# Patient Record
Sex: Male | Born: 1945 | Race: White | Hispanic: No | Marital: Married | Smoking: Former smoker
Health system: Southern US, Community
[De-identification: ages and names within clinical notes are randomized; demographics above are authoritative.]

## PROBLEM LIST (undated history)

## (undated) DIAGNOSIS — C801 Malignant (primary) neoplasm, unspecified: Secondary | ICD-10-CM

## (undated) DIAGNOSIS — I10 Essential (primary) hypertension: Secondary | ICD-10-CM

## (undated) DIAGNOSIS — J069 Acute upper respiratory infection, unspecified: Secondary | ICD-10-CM

## (undated) DIAGNOSIS — I4819 Other persistent atrial fibrillation: Secondary | ICD-10-CM

## (undated) DIAGNOSIS — Z7901 Long term (current) use of anticoagulants: Secondary | ICD-10-CM

## (undated) DIAGNOSIS — R609 Edema, unspecified: Secondary | ICD-10-CM

## (undated) DIAGNOSIS — G4733 Obstructive sleep apnea (adult) (pediatric): Secondary | ICD-10-CM

## (undated) DIAGNOSIS — C61 Malignant neoplasm of prostate: Secondary | ICD-10-CM

## (undated) HISTORY — DX: Obstructive sleep apnea (adult) (pediatric): G47.33

## (undated) HISTORY — PX: PROSTATECTOMY: SHX69

## (undated) HISTORY — DX: Other persistent atrial fibrillation: I48.19

---

## 1987-07-14 HISTORY — PX: LUMBAR LAMINECTOMY: SHX95

## 2013-10-03 ENCOUNTER — Ambulatory Visit (INDEPENDENT_AMBULATORY_CARE_PROVIDER_SITE_OTHER): Payer: Medicare Other | Admitting: Diagnostic Neuroimaging

## 2013-10-03 ENCOUNTER — Encounter (INDEPENDENT_AMBULATORY_CARE_PROVIDER_SITE_OTHER): Payer: Self-pay

## 2013-10-03 ENCOUNTER — Encounter: Payer: Self-pay | Admitting: Diagnostic Neuroimaging

## 2013-10-03 VITALS — BP 156/90 | HR 66 | Ht 70.0 in | Wt 227.0 lb

## 2013-10-03 DIAGNOSIS — IMO0002 Reserved for concepts with insufficient information to code with codable children: Secondary | ICD-10-CM

## 2013-10-03 DIAGNOSIS — M5416 Radiculopathy, lumbar region: Secondary | ICD-10-CM

## 2013-10-03 MED ORDER — CYCLOBENZAPRINE HCL 10 MG PO TABS: 10.0000 mg | ORAL_TABLET | Freq: Three times a day (TID) | ORAL | Status: DC | PRN

## 2013-10-03 NOTE — Patient Instructions (Signed)
Cyclobenzaprine tablets  What is this medicine? CYCLOBENZAPRINE (sye kloe BEN za preen) is a muscle relaxer. It is used to treat muscle pain, spasms, and stiffness. This medicine may be used for other purposes; ask your health care provider or pharmacist if you have questions. COMMON BRAND NAME(S): Fexmid, Flexeril  How should I use this medicine? Take this medicine by mouth with a glass of water. Follow the directions on the prescription label. If this medicine upsets your stomach, take it with food or milk. Take your medicine at regular intervals. Do not take it more often than directed. Talk to your pediatrician regarding the use of this medicine in children. Special care may be needed. Overdosage: If you think you have taken too much of this medicine contact a poison control center or emergency room at once. NOTE: This medicine is only for you. Do not share this medicine with others.  What if I miss a dose? If you miss a dose, take it as soon as you can. If it is almost time for your next dose, take only that dose. Do not take double or extra doses.  What may interact with this medicine? -alcohol  What should I watch for while using this medicine? Check with your doctor or health care professional if your condition does not improve within 1 to 3 weeks. You may get drowsy or dizzy when you first start taking the medicine or change doses. Do not drive, use machinery, or do anything that may be dangerous until you know how the medicine affects you. Stand or sit up slowly. Your mouth may get dry. Drinking water, chewing sugarless gum, or sucking on hard candy may help.  What side effects may I notice from receiving this medicine? Side effects that you should report to your doctor or health care professional as soon as possible: -allergic reactions like skin rash, itching or hives, swelling of the face, lips, or tongue -chest pain -fast  heartbeat -hallucinations -seizures -vomiting Side effects that usually do not require medical attention (report to your doctor or health care professional if they continue or are bothersome): -headache This list may not describe all possible side effects. Call your doctor for medical advice about side effects. You may report side effects to FDA at 1-800-FDA-1088.  Where should I keep my medicine? Keep out of the reach of children. Store at room temperature between 15 and 30 degrees C (59 and 86 degrees F). Keep container tightly closed. Throw away any unused medicine after the expiration date.  NOTE: This sheet is a summary. It may not cover all possible information. If you have questions about this medicine, talk to your doctor, pharmacist, or health care provider.  2014, Elsevier/Gold Standard. (2013-01-24 12:48:19)

## 2013-10-03 NOTE — Progress Notes (Signed)
[facility]  PATIENT: Charles Cameron DOB: 04/30/1946  REFERRING CLINICIAN: Cho HISTORY FROM: patient  REASON FOR VISIT: new consult   HISTORICAL  CHIEF COMPLAINT:  Chief Complaint  Patient presents with  . Pain    L side hip    HISTORY OF PRESENT ILLNESS:   68 year old male with new onset left hip and left leg pain for the past 5 weeks. Patient has history of L5-S1 laminectomy in 1989.  Patient has been using yoga classes for the past 4 months. 5 weeks ago he started a new class with more advanced positions and stretching. The day after the class patient had significant right knee and left hip pain. Over time his right knee pain resolved. His left hip pain this persisted. Patient describes an aching, throbbing, painful sensation radiating from his left hip, down his left leg and into his left calf. Patient has tried initial stretching exercises, ibuprofen without adequate relief.  No problems with his right leg. No cause of his upper extremities, face, vision.  REVIEW OF SYSTEMS: Full 14 system review of systems performed and notable only for only as per history of present illness. Otherwise negative.  ALLERGIES: No Known Allergies  HOME MEDICATIONS: No outpatient prescriptions prior to visit.   No facility-administered medications prior to visit.    PAST MEDICAL HISTORY: History reviewed. No pertinent past medical history.  PAST SURGICAL HISTORY: Past Surgical History  Procedure Laterality Date  . Lumbar laminectomy  1989    FAMILY HISTORY: Family History  Problem Relation Age of Onset  . Heart attack Mother   . Prostate cancer Father   . Colon cancer    . Hypertension Father   . Hypertension Mother   . Diabetes Paternal Grandfather     SOCIAL HISTORY:  History   Social History  . Marital Status: Married    Spouse Name: [name]    Number of Children: 5  . Years of Education: 16   Occupational History  . retired    Social  History Main Topics  . Smoking status: Former Smoker -- 2.00 packs/day for 27 years    Types: Cigarettes    Quit date: 07/14/1983  . Smokeless tobacco: Never Used  . Alcohol Use: Yes     Comment: light use; rarely  . Drug Use: No  . Sexual Activity: Not on file   Other Topics Concern  . Not on file   Social History Narrative   Patient lives at home with spouse.   Caffeine Use: 2 cups daily     PHYSICAL EXAM  Filed Vitals:   10/03/13 0838  BP: 156/90  Pulse: 66  Height: 5\' 10"  (1.778 m)  Weight: 227 lb (102.967 kg)    Not recorded    Body mass index is 32.57 kg/(m^2).  GENERAL EXAM: Patient is in no distress; well developed, nourished and groomed; neck is supple; POSITIVE CROSSED AND STRAIGHT LEG RAISE (TRIGGERS PAIN IN LEFT HIP).  CARDIOVASCULAR: Regular rate and rhythm, no murmurs, no carotid bruits  NEUROLOGIC: MENTAL STATUS: awake, alert, oriented to person, place and time, recent and remote memory intact, normal attention and concentration, language fluent, comprehension intact, naming intact, fund of knowledge appropriate CRANIAL NERVE: no papilledema on fundoscopic exam, pupils equal and reactive to light, visual fields full to confrontation, extraocular muscles intact, no nystagmus, facial sensation and strength symmetric, hearing intact, palate elevates symmetrically, uvula midline, shoulder shrug symmetric, tongue midline. MOTOR: normal bulk and tone, full strength in the  BUE, BLE SENSORY: normal and symmetric to light touch, pinprick, temperature, vibration; EXCEPT DECR IN LEFT LLE IN THE LATERAL ASPECT BELOW THE KNEE COORDINATION: finger-nose-finger, fine finger movements normal REFLEXES: BUE 2, KNEES 2, RIGHT ANKLE 1, LEFT ANKLE TRACE GAIT/STATION: narrow based gait; able to walk on toes, heels and tandem; romberg is negative    DIAGNOSTIC DATA (LABS, IMAGING, TESTING) - I reviewed patient records, labs, notes, testing and imaging myself where  available.  No results found for this basename: WBC, HGB, HCT, MCV, PLT   No results found for this basename: na, k, cl, co2, glucose, bun, creatinine, calcium, prot, albumin, ast, alt, alkphos, bilitot, gfrnonaa, gfraa   No results found for this basename: CHOL, HDL, LDLCALC, LDLDIRECT, TRIG, CHOLHDL   No results found for this basename: HGBA1C   No results found for this basename: VITAMINB12   No results found for this basename: TSH    I reviewed images myself and agree with interpretation. -VRP  Outside Lumbar xrays (4 views) - mild degenerative changes   ASSESSMENT AND PLAN  68 y.o. year old male here with new onset left hip, left leg pain following change in physical activity. Neurologic examination notable for positive straight and crossed leg raise testing, with decreased sensation of the left leg. His numbness in his left leg may be related to prior left L5 radiculopathy, according to patient's count. However the new pain may be related to a new L5 or S1 radiculopathy.  Ddx: left lumbar radiculopathy (L5, S1) vs musculoskeletal strain vs myofascial pain  PLAN: - cyclobenzaprine prn - PT eval - MRI lumbar spine   Orders Placed This Encounter  Procedures  . MR Lumbar Spine Wo Contrast  . Ambulatory referral to Physical Therapy   Meds ordered this encounter  Medications  . ibuprofen (ADVIL,MOTRIN) 600 MG tablet    Sig: Take 600 mg by mouth 3 (three) times daily.  . cyclobenzaprine (FLEXERIL) 10 MG tablet    Sig: Take 1 tablet (10 mg total) by mouth 3 (three) times daily as needed for muscle spasms.    Dispense:  90 tablet    Refill:  0   Return in about 3 months (around 01/03/2014).    Penni Bombard, MD 1/61/0960, 4:54 AM Certified in Neurology, Neurophysiology and Neuroimaging  [facility] [address] [place], [place] [address] [phone]

## 2013-10-08 ENCOUNTER — Ambulatory Visit
Admission: RE | Admit: 2013-10-08 | Discharge: 2013-10-08 | Disposition: A | Discharge status: Home / Self Care | Payer: Medicare Other | Source: Ambulatory Visit | Attending: Diagnostic Neuroimaging | Admitting: Diagnostic Neuroimaging

## 2013-10-08 DIAGNOSIS — M5416 Radiculopathy, lumbar region: Secondary | ICD-10-CM

## 2013-10-09 ENCOUNTER — Ambulatory Visit: Payer: Medicare Other | Attending: Diagnostic Neuroimaging

## 2013-10-09 DIAGNOSIS — R5381 Other malaise: Secondary | ICD-10-CM | POA: Insufficient documentation

## 2013-10-09 DIAGNOSIS — IMO0002 Reserved for concepts with insufficient information to code with codable children: Secondary | ICD-10-CM | POA: Insufficient documentation

## 2013-10-09 DIAGNOSIS — IMO0001 Reserved for inherently not codable concepts without codable children: Secondary | ICD-10-CM | POA: Insufficient documentation

## 2013-10-09 DIAGNOSIS — M25669 Stiffness of unspecified knee, not elsewhere classified: Secondary | ICD-10-CM | POA: Insufficient documentation

## 2013-10-10 ENCOUNTER — Telehealth: Payer: Self-pay | Admitting: Diagnostic Neuroimaging

## 2013-10-10 DIAGNOSIS — IMO0002 Reserved for concepts with insufficient information to code with codable children: Secondary | ICD-10-CM

## 2013-10-10 NOTE — Telephone Encounter (Signed)
Patient calling for results of his recent MRI.  Please call to advise.

## 2013-10-10 NOTE — Telephone Encounter (Signed)
Pt is calling requesting MRI results. I informed the pt that his MRI results is not in yet. Pt verbalized understanding. FYI

## 2013-10-11 ENCOUNTER — Ambulatory Visit: Payer: Medicare Other | Attending: Diagnostic Neuroimaging

## 2013-10-11 DIAGNOSIS — M25669 Stiffness of unspecified knee, not elsewhere classified: Secondary | ICD-10-CM | POA: Insufficient documentation

## 2013-10-11 DIAGNOSIS — IMO0002 Reserved for concepts with insufficient information to code with codable children: Secondary | ICD-10-CM | POA: Insufficient documentation

## 2013-10-11 DIAGNOSIS — IMO0001 Reserved for inherently not codable concepts without codable children: Secondary | ICD-10-CM | POA: Insufficient documentation

## 2013-10-11 DIAGNOSIS — R5381 Other malaise: Secondary | ICD-10-CM | POA: Insufficient documentation

## 2013-10-11 NOTE — Telephone Encounter (Signed)
Pt calling requesting MRI results. Please advise °

## 2013-10-11 NOTE — Telephone Encounter (Signed)
Patient is calling to request his MRI results. Please call and advise patient.

## 2013-10-12 NOTE — Telephone Encounter (Signed)
I called patient and spouse multiple times. No answer. Cannot leave message on patient's phone.   Please call and give MRI results. There is possible pinched nerve (left L5) and other degenerative changes as per report.   Options are to continue physical therapy or we can refer him to spine surgeon for surgical evaluation. If he is reached by phone, I can discuss further. Otherwise, mail letter to patient.  Penni Bombard, MD 09/13/74, 2:26 PM Certified in Neurology, Neurophysiology and Neuroimaging  [facility] [address], [place], [place] [address] [phone]

## 2013-10-12 NOTE — Telephone Encounter (Signed)
Called pt to inform him per Dr. Leta Baptist that there is possible pinched neerve (left L5) and other degenerative changes as per report and that the pt's options are to continue physical therapy or the doctor can refer pt to spine surgeon for surgical evaluation. Pt stated that he would like to finish physical therapy first and then he will let Dr. Leta Baptist know what he would like to do then. I advised the pt that if he has any other problems, questions or concerns to call the office. Pt verbalized understanding. FYI

## 2013-10-16 ENCOUNTER — Ambulatory Visit: Payer: Medicare Other | Admitting: Physical Therapy

## 2013-10-18 ENCOUNTER — Ambulatory Visit: Payer: Medicare Other

## 2013-10-23 ENCOUNTER — Ambulatory Visit: Payer: Medicare Other

## 2013-10-25 ENCOUNTER — Ambulatory Visit: Payer: Medicare Other | Admitting: Physical Therapy

## 2013-10-30 ENCOUNTER — Ambulatory Visit: Payer: Medicare Other | Admitting: Physical Therapy

## 2013-11-01 ENCOUNTER — Ambulatory Visit: Payer: Medicare Other

## 2014-01-03 ENCOUNTER — Ambulatory Visit: Payer: Medicare Other | Admitting: Diagnostic Neuroimaging

## 2014-01-31 ENCOUNTER — Ambulatory Visit: Payer: Medicare Other | Admitting: Diagnostic Neuroimaging

## 2014-03-15 ENCOUNTER — Ambulatory Visit: Payer: Medicare Other | Admitting: Diagnostic Neuroimaging

## 2014-07-13 DIAGNOSIS — C61 Malignant neoplasm of prostate: Secondary | ICD-10-CM

## 2014-07-13 HISTORY — DX: Malignant neoplasm of prostate: C61

## 2018-07-10 ENCOUNTER — Other Ambulatory Visit: Payer: Self-pay

## 2018-07-10 ENCOUNTER — Emergency Department (HOSPITAL_BASED_OUTPATIENT_CLINIC_OR_DEPARTMENT_OTHER): Payer: Medicare Other

## 2018-07-10 ENCOUNTER — Encounter (HOSPITAL_BASED_OUTPATIENT_CLINIC_OR_DEPARTMENT_OTHER): Payer: Self-pay

## 2018-07-10 ENCOUNTER — Inpatient Hospital Stay (HOSPITAL_BASED_OUTPATIENT_CLINIC_OR_DEPARTMENT_OTHER)
Admission: EM | Admit: 2018-07-10 | Discharge: 2018-07-14 | DRG: 308 | Disposition: A | Discharge status: Home / Self Care | Payer: Medicare Other | Attending: Cardiology | Admitting: Cardiology

## 2018-07-10 DIAGNOSIS — Z8042 Family history of malignant neoplasm of prostate: Secondary | ICD-10-CM | POA: Diagnosis not present

## 2018-07-10 DIAGNOSIS — R0602 Shortness of breath: Secondary | ICD-10-CM

## 2018-07-10 DIAGNOSIS — Z7901 Long term (current) use of anticoagulants: Secondary | ICD-10-CM

## 2018-07-10 DIAGNOSIS — J189 Pneumonia, unspecified organism: Secondary | ICD-10-CM | POA: Diagnosis present

## 2018-07-10 DIAGNOSIS — Z8546 Personal history of malignant neoplasm of prostate: Secondary | ICD-10-CM

## 2018-07-10 DIAGNOSIS — I480 Paroxysmal atrial fibrillation: Secondary | ICD-10-CM | POA: Diagnosis present

## 2018-07-10 DIAGNOSIS — Z8249 Family history of ischemic heart disease and other diseases of the circulatory system: Secondary | ICD-10-CM

## 2018-07-10 DIAGNOSIS — J440 Chronic obstructive pulmonary disease with acute lower respiratory infection: Secondary | ICD-10-CM | POA: Diagnosis present

## 2018-07-10 DIAGNOSIS — I4892 Unspecified atrial flutter: Secondary | ICD-10-CM | POA: Diagnosis not present

## 2018-07-10 DIAGNOSIS — I441 Atrioventricular block, second degree: Secondary | ICD-10-CM | POA: Diagnosis present

## 2018-07-10 DIAGNOSIS — I483 Typical atrial flutter: Secondary | ICD-10-CM | POA: Diagnosis present

## 2018-07-10 DIAGNOSIS — Z87891 Personal history of nicotine dependence: Secondary | ICD-10-CM

## 2018-07-10 DIAGNOSIS — Z800 Family history of malignant neoplasm of digestive organs: Secondary | ICD-10-CM

## 2018-07-10 DIAGNOSIS — Z79899 Other long term (current) drug therapy: Secondary | ICD-10-CM

## 2018-07-10 DIAGNOSIS — J069 Acute upper respiratory infection, unspecified: Secondary | ICD-10-CM | POA: Diagnosis present

## 2018-07-10 DIAGNOSIS — I248 Other forms of acute ischemic heart disease: Secondary | ICD-10-CM | POA: Diagnosis present

## 2018-07-10 DIAGNOSIS — I351 Nonrheumatic aortic (valve) insufficiency: Secondary | ICD-10-CM | POA: Diagnosis not present

## 2018-07-10 DIAGNOSIS — R609 Edema, unspecified: Secondary | ICD-10-CM | POA: Diagnosis present

## 2018-07-10 DIAGNOSIS — I340 Nonrheumatic mitral (valve) insufficiency: Secondary | ICD-10-CM | POA: Diagnosis present

## 2018-07-10 DIAGNOSIS — K5900 Constipation, unspecified: Secondary | ICD-10-CM | POA: Diagnosis present

## 2018-07-10 DIAGNOSIS — R55 Syncope and collapse: Secondary | ICD-10-CM

## 2018-07-10 HISTORY — DX: Acute upper respiratory infection, unspecified: J06.9

## 2018-07-10 HISTORY — DX: Long term (current) use of anticoagulants: Z79.01

## 2018-07-10 HISTORY — DX: Edema, unspecified: R60.9

## 2018-07-10 LAB — CBC
HCT: 43.5 % (ref 39.0–52.0)
HEMOGLOBIN: 14.1 g/dL (ref 13.0–17.0)
MCH: 31.6 pg (ref 26.0–34.0)
MCHC: 32.4 g/dL (ref 30.0–36.0)
MCV: 97.5 fL (ref 80.0–100.0)
Platelets: 224 10*3/uL (ref 150–400)
RBC: 4.46 MIL/uL (ref 4.22–5.81)
RDW: 13.2 % (ref 11.5–15.5)
WBC: 7.5 10*3/uL (ref 4.0–10.5)
nRBC: 0 % (ref 0.0–0.2)

## 2018-07-10 LAB — BASIC METABOLIC PANEL
Anion gap: 7 (ref 5–15)
BUN: 21 mg/dL (ref 8–23)
CO2: 29 mmol/L (ref 22–32)
Calcium: 8.4 mg/dL — ABNORMAL LOW (ref 8.9–10.3)
Chloride: 103 mmol/L (ref 98–111)
Creatinine, Ser: 1.07 mg/dL (ref 0.61–1.24)
GFR calc Af Amer: >60 mL/min (ref >60)
GFR calc non Af Amer: >60 mL/min (ref >60)
Glucose, Bld: 124 mg/dL — ABNORMAL HIGH (ref 70–99)
POTASSIUM: 4.1 mmol/L (ref 3.5–5.1)
Sodium: 139 mmol/L (ref 135–145)

## 2018-07-10 LAB — HEPATIC FUNCTION PANEL
ALT: 102 U/L — ABNORMAL HIGH (ref 0–44)
AST: 65 U/L — ABNORMAL HIGH (ref 15–41)
Albumin: 3.3 g/dL — ABNORMAL LOW (ref 3.5–5.0)
Alkaline Phosphatase: 153 U/L — ABNORMAL HIGH (ref 38–126)
Bilirubin, Direct: 0.2 mg/dL (ref 0.0–0.2)
Indirect Bilirubin: 0.7 mg/dL (ref 0.3–0.9)
Total Bilirubin: 0.9 mg/dL (ref 0.3–1.2)
Total Protein: 6.1 g/dL — ABNORMAL LOW (ref 6.5–8.1)

## 2018-07-10 LAB — INFLUENZA PANEL BY PCR (TYPE A & B)
Influenza A By PCR: NEGATIVE
Influenza B By PCR: NEGATIVE

## 2018-07-10 LAB — BRAIN NATRIURETIC PEPTIDE: B Natriuretic Peptide: 317.2 pg/mL — ABNORMAL HIGH (ref 0.0–100.0)

## 2018-07-10 LAB — TROPONIN I: Troponin I: 0.03 ng/mL (ref <0.03)

## 2018-07-10 MED ORDER — AZITHROMYCIN 500 MG IV SOLR
INTRAVENOUS | Status: AC
  Filled 2018-07-10: qty 500

## 2018-07-10 MED ORDER — DILTIAZEM HCL 100 MG IV SOLR
5.0000 mg/h | INTRAVENOUS | Status: DC
  Administered 2018-07-10 (×2): 5 mg/h via INTRAVENOUS

## 2018-07-10 MED ORDER — ONDANSETRON HCL 4 MG/2ML IJ SOLN: 4.0000 mg | Freq: Four times a day (QID) | INTRAMUSCULAR | Status: DC | PRN

## 2018-07-10 MED ORDER — SODIUM CHLORIDE 0.9 % IV BOLUS: 500.0000 mL | Freq: Once | INTRAVENOUS | Status: DC

## 2018-07-10 MED ORDER — ONDANSETRON HCL 4 MG/2ML IJ SOLN
4.0000 mg | Freq: Once | INTRAMUSCULAR | Status: AC
  Administered 2018-07-10: 4 mg via INTRAVENOUS

## 2018-07-10 MED ORDER — SODIUM CHLORIDE 0.9 % IV SOLN
500.0000 mg | Freq: Once | INTRAVENOUS | Status: AC
  Administered 2018-07-10: 500 mg via INTRAVENOUS
  Filled 2018-07-10: qty 500

## 2018-07-10 MED ORDER — DILTIAZEM HCL 100 MG IV SOLR
INTRAVENOUS | Status: AC
  Filled 2018-07-10: qty 100

## 2018-07-10 MED ORDER — ACETAMINOPHEN 325 MG PO TABS
650.0000 mg | ORAL_TABLET | ORAL | Status: DC | PRN
  Filled 2018-07-10: qty 2

## 2018-07-10 MED ORDER — ORAL CARE MOUTH RINSE
15.0000 mL | Freq: Two times a day (BID) | OROMUCOSAL | Status: DC
  Administered 2018-07-11 – 2018-07-13 (×6): 15 mL via OROMUCOSAL

## 2018-07-10 MED ORDER — GLUCAGON HCL RDNA (DIAGNOSTIC) 1 MG IJ SOLR
1.0000 mg | Freq: Once | INTRAMUSCULAR | Status: AC
  Administered 2018-07-10: 1 mg via INTRAVENOUS
  Filled 2018-07-10: qty 1

## 2018-07-10 MED ORDER — SODIUM CHLORIDE 0.9 % IV SOLN
1.0000 g | Freq: Once | INTRAVENOUS | Status: AC
  Administered 2018-07-10: 1 g via INTRAVENOUS
  Filled 2018-07-10: qty 10

## 2018-07-10 MED ORDER — METOPROLOL TARTRATE 5 MG/5ML IV SOLN
5.0000 mg | Freq: Once | INTRAVENOUS | Status: AC
  Administered 2018-07-10: 5 mg via INTRAVENOUS
  Filled 2018-07-10: qty 5

## 2018-07-10 MED ORDER — ONDANSETRON HCL 4 MG/2ML IJ SOLN
INTRAMUSCULAR | Status: AC
  Filled 2018-07-10: qty 2

## 2018-07-10 MED ORDER — APIXABAN 5 MG PO TABS
5.0000 mg | ORAL_TABLET | Freq: Two times a day (BID) | ORAL | Status: DC
  Administered 2018-07-10 – 2018-07-14 (×8): 5 mg via ORAL
  Filled 2018-07-10 (×4): qty 1
  Filled 2018-07-10: qty 2
  Filled 2018-07-10 (×3): qty 1

## 2018-07-10 MED ORDER — HEPARIN BOLUS VIA INFUSION: 6000.0000 [IU] | Freq: Once | INTRAVENOUS | Status: DC

## 2018-07-10 MED ORDER — DILTIAZEM LOAD VIA INFUSION
15.0000 mg | Freq: Once | INTRAVENOUS | Status: AC
  Administered 2018-07-10: 15 mg via INTRAVENOUS
  Filled 2018-07-10: qty 15

## 2018-07-10 MED ORDER — HEPARIN (PORCINE) 25000 UT/250ML-% IV SOLN: 1650.0000 [IU]/h | INTRAVENOUS | Status: DC

## 2018-07-10 MED ORDER — SODIUM CHLORIDE 0.9 % IV BOLUS
500.0000 mL | Freq: Once | INTRAVENOUS | Status: AC
  Administered 2018-07-10: 500 mL via INTRAVENOUS

## 2018-07-10 MED ORDER — DILTIAZEM HCL-DEXTROSE 100-5 MG/100ML-% IV SOLN (PREMIX)
5.0000 mg/h | INTRAVENOUS | Status: AC
  Administered 2018-07-10 – 2018-07-11 (×2): 15 mg/h via INTRAVENOUS
  Filled 2018-07-10 (×3): qty 100

## 2018-07-10 NOTE — ED Notes (Signed)
ED Provider at bedside. 

## 2018-07-10 NOTE — ED Notes (Signed)
Pt placed on defib pads

## 2018-07-10 NOTE — Progress Notes (Addendum)
ANTICOAGULATION CONSULT NOTE - Initial Consult  Pharmacy Consult for apixaban Indication:atrial fibrillation  No Known Allergies  Patient Measurements: Height: 5\' 11"  (180.3 cm) Weight: 230 lb (104.3 kg) IBW/kg (Calculated) : 75.3 Heparin Dosing Weight: 97 kg  Vital Signs: BP: 126/101 (12/29 1515) Pulse Rate: 156 (12/29 1515)  Labs: Recent Labs    07/10/18 1421  HGB 14.1  HCT 43.5  PLT 224  CREATININE 1.07  TROPONINI 0.03*    Estimated Creatinine Clearance: 74.4 mL/min (by C-G formula based on SCr of 1.07 mg/dL).  Assessment: CC/HPI: 72 yo m presenting with increased SOB tachy pulm edema; new onset afib  PMH: COPD  Anticoag: none pta   Renal: SCr 1.07  Heme/Onc: H&H 14.1/43.5, Plt 224   Plan:  DC hep Apixaban 5 mg bid  Levester Fresh, PharmD, BCPS, BCCCP Clinical Pharmacist [phone]  Please check AMION for all [place] numbers  07/10/2018 3:40 PM

## 2018-07-10 NOTE — ED Notes (Signed)
Dr. Paticia Stack paged for bed assignment.

## 2018-07-10 NOTE — ED Notes (Signed)
[facility] arrived to transfer pt to [facility]

## 2018-07-10 NOTE — ED Triage Notes (Signed)
Pt states hx of chronic bronchitis with increased SOB today. HR 155 during triage. Denies chest pain. Prior to arrival, pt had "bad coughing fit, felt like his head was going to explode- possible LOC and woke up to limbs shaking. No hx siezures". SpO2 95% on RA.

## 2018-07-10 NOTE — ED Notes (Signed)
Pt used albuterol this AM>

## 2018-07-10 NOTE — ED Notes (Signed)
Patient got on a Bedside commode with the assistance of RN and this EMT. Only Urinated no bowel movements. Once patient got back into bed his heart rate went to 200 then 196 then back to 155-150. RN aware.

## 2018-07-10 NOTE — ED Notes (Signed)
Spoke with Agricultural consultant on [place]. RN will call cardiologist to discuss pt placement.

## 2018-07-10 NOTE — ED Notes (Signed)
Pt voiced not feeling well- unable to breath. Face turned red. Pt became diaphoretic. Blood pressure recycled.

## 2018-07-10 NOTE — ED Notes (Signed)
2nd set of cultures obtained from right hand via straight stick

## 2018-07-10 NOTE — ED Notes (Signed)
Pt placed on 2L O2 nasal cannula

## 2018-07-10 NOTE — ED Notes (Signed)
Pt in aflutter

## 2018-07-10 NOTE — ED Notes (Signed)
Pt rhythm now in Afib. HR 115. Repeat EKG done.

## 2018-07-10 NOTE — ED Provider Notes (Signed)
Emergency Department Provider Note   I have reviewed the triage vital signs and the nursing notes.   HISTORY  Chief Complaint No chief complaint on file.   HPI Charles Cameron is a 72 y.o. male with PMH of COPD presents to the emergency department for evaluation of congestion and shortness of breath for the past 3 weeks.  Patient states that he has chronic bronchitis and has been using his albuterol nebulizer with no relief in symptoms.  He does feel like symptoms have worsened over the past several days.  He is not experiencing any chest pain or heart palpitations.  No prior history of arrhythmia.  He presented today because of an episode of severe coughing which led to a brief syncopal event.  This was witnessed by the family at bedside who state that it lasted for only a few seconds and then he returned to his normal self. No radiation of symptoms or modifying factors.   History reviewed. No pertinent past medical history.  Patient Active Problem List   Diagnosis Date Noted  . Atrial flutter ([place]) 07/10/2018    Past Surgical History:  Procedure Laterality Date  . LUMBAR LAMINECTOMY  1989   Allergies Patient has no known allergies.  Family History  Problem Relation Age of Onset  . Heart attack Mother   . Hypertension Mother   . Prostate cancer Father   . Hypertension Father   . Colon cancer Other   . Diabetes Paternal Grandfather     Social History Social History   Tobacco Use  . Smoking status: Former Smoker    Packs/day: 2.00    Years: 27.00    Pack years: 54.00    Types: Cigarettes    Last attempt to quit: 07/14/1983    Years since quitting: 35.0  . Smokeless tobacco: Never Used  Substance Use Topics  . Alcohol use: Yes    Comment: light use; rarely  . Drug use: No    Review of Systems  Constitutional: No fever/chills Eyes: No visual changes. ENT: No sore throat. Cardiovascular: Denies chest pain. Respiratory: Positive shortness of breath and  cough.  Gastrointestinal: No abdominal pain.  No nausea, no vomiting.  No diarrhea.  No constipation. Genitourinary: Negative for dysuria. Musculoskeletal: Negative for back pain. Skin: Negative for rash. Neurological: Negative for headaches, focal weakness or numbness.  10-point ROS otherwise negative.  ____________________________________________   PHYSICAL EXAM:  VITAL SIGNS: ED Triage Vitals [07/10/18 1418]  Enc Vitals Group     BP (!) 151/115     Pulse Rate (!) 155     Resp (!) 21     SpO2 95 %     Pain Score 0   Constitutional: Alert and oriented. Well appearing and in no acute distress. Eyes: Conjunctivae are normal.  Head: Atraumatic. Nose: No congestion/rhinnorhea. Mouth/Throat: Mucous membranes are moist. Neck: No stridor.   Cardiovascular: Rapid a-flutter. Good peripheral circulation. Grossly normal heart sounds.   Respiratory: Increased respiratory effort.  No retractions. Lungs CTAB. Gastrointestinal: Soft and nontender. No distention.  Musculoskeletal: No lower extremity tenderness nor edema. No gross deformities of extremities. Neurologic:  Normal speech and language. No gross focal neurologic deficits are appreciated.  Skin:  Skin is warm, dry and intact. No rash noted.   ____________________________________________   LABS (all labs ordered are listed, but only abnormal results are displayed)  Labs Reviewed  BASIC METABOLIC PANEL - Abnormal; Notable for the following components:      Result Value  Glucose, Bld 124 (*)    Calcium 8.4 (*)    All other components within normal limits  TROPONIN I - Abnormal; Notable for the following components:   Troponin I 0.03 (*)    All other components within normal limits  HEPATIC FUNCTION PANEL - Abnormal; Notable for the following components:   Total Protein 6.1 (*)    Albumin 3.3 (*)    AST 65 (*)    ALT 102 (*)    Alkaline Phosphatase 153 (*)    All other components within normal limits  BRAIN  NATRIURETIC PEPTIDE - Abnormal; Notable for the following components:   B Natriuretic Peptide 317.2 (*)    All other components within normal limits  CULTURE, BLOOD (ROUTINE X 2)  CULTURE, BLOOD (ROUTINE X 2)  CBC  INFLUENZA PANEL BY PCR (TYPE A & B)  HEPARIN LEVEL (UNFRACTIONATED)   ____________________________________________  EKG   EKG Interpretation  Date/Time:  Sunday July 10 2018 14:18:22 EST Ventricular Rate:  156 PR Interval:    QRS Duration: 128 QT Interval:  349 QTC Calculation: 563 R Axis:   60 Text Interpretation:  Atrial flutter  Right bundle branch block No STEMI.  Confirmed by Nanda Quinton [phone]) on 07/10/2018 3:10:41 PM       ____________________________________________  RADIOLOGY  Dg Chest Port 1 View  Result Date: 07/10/2018 CLINICAL DATA:  Shortness of breath. EXAM: PORTABLE CHEST 1 VIEW COMPARISON:  04/26/2017 FINDINGS: Significant interstitial thickening in both lungs, particularly in the lower lungs. Focus of interstitial thickening in the right suprahilar region as well. This is a marked change from the prior examination. Heart size is upper limits of normal. The trachea is midline. Bone structures are unremarkable. Negative for a pneumothorax. IMPRESSION: Significant interstitial thickening throughout both lungs, particularly at the lung bases. Differential diagnosis includes interstitial pulmonary edema versus atypical infection. Electronically Signed   By: Markus Daft M.D.   On: 07/10/2018 14:49    ____________________________________________   PROCEDURES  Procedure(s) performed:   .Critical Care Performed by: Margette Fast, MD Authorized by: Margette Fast, MD   Critical care provider statement:    Critical care time (minutes):  45   Critical care time was exclusive of:  Separately billable procedures and treating other patients and teaching time   Critical care was necessary to treat or prevent imminent or life-threatening  deterioration of the following conditions:  Cardiac failure, circulatory failure and respiratory failure   Critical care was time spent personally by me on the following activities:  Discussions with consultants, evaluation of patient's response to treatment, examination of patient, ordering and performing treatments and interventions, ordering and review of laboratory studies, ordering and review of radiographic studies, pulse oximetry, re-evaluation of patient's condition, obtaining history from patient or surrogate, review of old charts, blood draw for specimens and development of treatment plan with patient or surrogate   I assumed direction of critical care for this patient from another provider in my specialty: no     ____________________________________________   INITIAL IMPRESSION / ASSESSMENT AND PLAN / ED COURSE  Pertinent labs & imaging results that were available during my care of the patient were reviewed by me and considered in my medical decision making (see chart for details).  Patient presents to the ED with cough and congestion symptoms. Had a syncope event today with coughing and found to be in A-flutter with rate in the 150s-160s. Narrow QRS. Differential is broad but patient with bronchitis type symptoms x 3  weeks so atypical PNA is elevated on the differential after CXR reviewed. No PNX or overt pulmonary edema on exam. Starting abx including azithromycin. Troponin with slight elevation, likely rate related. Doubt primary ischemic event.   HR unchanged with Diltiazem bolus and infusion. Spoke with Dr. Sallyanne Kuster with Cardiology. Advises Eliquis and IV metoprolol. No hypotension or instability clinically so no cardioversion at this time. Updated patient on the plan. Cardiology to admit.   Discussed patient's case with Cardiology, Dr. Sallyanne Kuster to request admission. Patient and family (if present) updated with plan. Care transferred to Cardiology service.  I reviewed all nursing  notes, vitals, pertinent old records, EKGs, labs, imaging (as available).   ____________________________________________  FINAL CLINICAL IMPRESSION(S) / ED DIAGNOSES  Final diagnoses:  SOB (shortness of breath)  Typical atrial flutter (H[facility])  Syncope, unspecified syncope type     MEDICATIONS GIVEN DURING THIS VISIT:  Medications  diltiazem (CARDIZEM) 1 mg/mL load via infusion 15 mg (15 mg Intravenous Bolus from Bag 07/10/18 1444)    And  diltiazem (CARDIZEM) 100 mg in dextrose 5 % 100 mL (1 mg/mL) infusion (0 mg/hr Intravenous Paused 07/10/18 1650)  diltiazem (CARDIZEM) 100 MG injection (has no administration in time range)  azithromycin (ZITHROMAX) 500 MG injection (has no administration in time range)  apixaban (ELIQUIS) tablet 5 mg (5 mg Oral Given 07/10/18 1545)  sodium chloride 0.9 % bolus 500 mL ( Intravenous Restarted 07/10/18 1658)  glucagon (human recombinant) (GLUCAGEN) injection 1 mg (has no administration in time range)  sodium chloride 0.9 % bolus 500 mL ( Intravenous Stopped 07/10/18 1545)  azithromycin (ZITHROMAX) 500 mg in sodium chloride 0.9 % 250 mL IVPB ( Intravenous Stopped 07/10/18 1706)  cefTRIAXone (ROCEPHIN) 1 g in sodium chloride 0.9 % 100 mL IVPB ( Intravenous Stopped 07/10/18 1603)  metoprolol tartrate (LOPRESSOR) injection 5 mg (5 mg Intravenous Given 07/10/18 1545)    Note:  This document was prepared using Dragon voice recognition software and may include unintentional dictation errors.  Nanda Quinton, MD Emergency Medicine    , [facility], MD 07/10/18 [phone]

## 2018-07-10 NOTE — ED Notes (Signed)
Attempted to give report to tele floor- pt inappropriate for unit due to titration of Cardizem. EDP and bed control made aware.

## 2018-07-10 NOTE — ED Notes (Signed)
Troponin 0.03 results given to ED MD and also aware of HR

## 2018-07-10 NOTE — ED Notes (Addendum)
RTT to bedside to assess lung sounds. Left side wheezing and cracking

## 2018-07-10 NOTE — ED Provider Notes (Signed)
72 yo M with a cc of fatigue.  Was found to be in a flutter with 2-1 block.  Patient is awaiting admission when I took over care.  Patient had an episode where he became sweaty and felt bad and went into atrial flutter with 4 1 block.  This was immediately after he got a dose of metoprolol.  He had some reported shortness of breath when it occurred as well.  He was given IV fluids with improvement of his blood pressure which went into the 80s.  I considered cardioversion though thought it was an unlikely rhythm because of hypotension.  More likely it was secondary to the metoprolol or vagal event.  The Cardizem was stopped.  The patient spontaneously went back into atrial flutter in 2-1 block.  He feels much better at heart rate of 150.  I received notice that the stepdown unit refused to accept the patient.  I am unsure of the reasoning for this but I got a request from [facility] to place a bed request for the ICU under the cardiology service.  CRITICAL CARE Performed by: Cecilio Asper   Total critical care time: 35 minutes  Critical care time was exclusive of separately billable procedures and treating other patients.  Critical care was necessary to treat or prevent imminent or life-threatening deterioration.  Critical care was time spent personally by me on the following activities: development of treatment plan with patient and/or surrogate as well as nursing, discussions with consultants, evaluation of patient's response to treatment, examination of patient, obtaining history from patient or surrogate, ordering and performing treatments and interventions, ordering and review of laboratory studies, ordering and review of radiographic studies, pulse oximetry and re-evaluation of patient's condition.    Deno Etienne, DO 07/10/18 2033

## 2018-07-10 NOTE — H&P (Signed)
Cardiology Admission History and Physical:   Patient ID: Charles Cameron; MRN: [id number]; DOB: 06-Dec-1945   Admission date: 07/10/2018  Primary Care Provider:  Rodena Medin, MD (Inactive) Primary Cardiologist:  No provider on file   Chief Complaint:  Worsening shortness of breath  History of Present Illness:   Charles Cameron is a 72 y.o. male with a history of recent bronchitis, seasonal allergies and prostate CA who presents with complaints of shortness of breath and a near-syncopal event at home. The patient states that he has been struggling with URI symptoms for at least 2 weeks. He has been treated with nebulizers and steroids without much relief. Yesterday he had a bout of coughing where he could not catch his breath. He was transiently not following commands (as reported by his wife) without actually passing out. This lasted for a few seconds and then resolved spontaneously. He therefore presented to the hospital where he was found to be in atrial flutter with 2:1 block and a heart rate in the 150s. He was treated with Diltiazem bolus and infusion. He was also given Azithromycin for ? atypical PNA.  The patient denies any prior episodes of atrial flutter or fibrillation. He does not endorse any chest pain, orthopnea or PND. He has had some mild leg swelling.  In the ED, after receiving 5mg  of IV metoprolol the patient had a second presyncopal episode. He had difficulty catching his breath. The RN noted that his face turned red and he became diaphoretic. Telemetry revealed transient decrease in the heart rate (at one point with 4:1 AV block) and low SBP of 80s. He improved with IV hydration. The heart rate returned to the 150s and he felt better.    History reviewed. No pertinent past medical history.  Past Surgical History:  Procedure Laterality Date  . LUMBAR LAMINECTOMY  1989     Medications Prior to Admission: Prior to Admission medications   Medication Sig Start Date End  Date Taking? Authorizing Provider  cyclobenzaprine (FLEXERIL) 10 MG tablet Take 1 tablet (10 mg total) by mouth 3 (three) times daily as needed for muscle spasms. 10/03/13   Penumalli, Earlean Polka, MD  ibuprofen (ADVIL,MOTRIN) 600 MG tablet Take 600 mg by mouth 3 (three) times daily.    [provider]     Allergies:   No Known Allergies  Social History:   Social History   Socioeconomic History  . Marital status: Married    Spouse name: [name]  . Number of children: 5  . Years of education: 3  . Highest education level: Not on file  Occupational History  . Occupation: retired  Scientific laboratory technician  . Financial resource strain: Not on file  . Food insecurity:    Worry: Not on file    Inability: Not on file  . Transportation needs:    Medical: Not on file    Non-medical: Not on file  Tobacco Use  . Smoking status: Former Smoker    Packs/day: 2.00    Years: 27.00    Pack years: 54.00    Types: Cigarettes    Last attempt to quit: 07/14/1983    Years since quitting: 35.0  . Smokeless tobacco: Never Used  Substance and Sexual Activity  . Alcohol use: Yes    Comment: light use; rarely  . Drug use: No  . Sexual activity: Not on file  Lifestyle  . Physical activity:    Days per week: Not on file    Minutes per session:  Not on file  . Stress: Not on file  Relationships  . Social connections:    Talks on phone: Not on file    Gets together: Not on file    Attends religious service: Not on file    Active member of club or organization: Not on file    Attends meetings of clubs or organizations: Not on file    Relationship status: Not on file  . Intimate partner violence:    Fear of current or ex partner: Not on file    Emotionally abused: Not on file    Physically abused: Not on file    Forced sexual activity: Not on file  Other Topics Concern  . Not on file  Social History Narrative   Patient lives at home with spouse.   Caffeine Use: 2 cups daily     Family History:    The patient's family history includes Colon cancer in an other family member; Diabetes in his paternal grandfather; Heart attack in his mother; Hypertension in his father and mother; Prostate cancer in his father.     Review of Systems: [y] = yes, [ ]  = no   . General: Weight gain [ ] ; Weight loss [ ] ; Anorexia [ ] ; Fatigue Blue.Reese ]; Fever [ ] ; Chills [ ] ; Weakness [ ]   . Cardiac: Chest pain/pressure [ ] ; Resting SOB Blue.Reese ]; Exertional SOB Blue.Reese ]; Orthopnea [ ] ; Pedal Edema [ ] ; Palpitations [ ] ; Syncope [ ] ; Presyncope [ ] ; Paroxysmal nocturnal dyspnea[ ]   . Pulmonary: Cough [ ] ; Wheezing[ ] ; Hemoptysis[ ] ; Sputum [ ] ; Snoring [ ]   . GI: Vomiting[ ] ; Dysphagia[ ] ; Melena[ ] ; Hematochezia [ ] ; Heartburn[ ] ; Abdominal pain [ ] ; Constipation [ ] ; Diarrhea [ ] ; BRBPR [ ]   . GU: Hematuria[ ] ; Dysuria [ ] ; Nocturia[ ]   . Vascular: Pain in legs with walking [ ] ; Pain in feet with lying flat [ ] ; Non-healing sores [ ] ; Stroke [ ] ; TIA [ ] ; Slurred speech [ ] ;  . Neuro: Headaches[ ] ; Vertigo[ ] ; Seizures[ ] ; Paresthesias[ ] ;Blurred vision [ ] ; Diplopia [ ] ; Vision changes [ ]   . Ortho/Skin: Arthritis [ ] ; Joint pain [ ] ; Muscle pain [ ] ; Joint swelling [ ] ; Back Pain [ ] ; Rash [ ]   . Psych: Depression[ ] ; Anxiety[ ]   . Heme: Bleeding problems [ ] ; Clotting disorders [ ] ; Anemia [ ]   . Endocrine: Diabetes [ ] ; Thyroid dysfunction[ ]      Physical Exam/Data:   Vitals:   07/10/18 2200 07/10/18 2215 07/10/18 2230 07/10/18 2245  BP: (!) 117/105 (!) 118/91 (!) 129/99 (!) 108/98  Pulse: (!) 150 (!) 150 (!) 150 (!) 151  Resp: (!) 25 19 (!) 23 (!) 21  Temp:      TempSrc:      SpO2: 95% 95% 95% 94%  Weight:      Height:        Intake/Output Summary (Last 24 hours) at 07/10/2018 2311 Last data filed at 07/10/2018 2048 Gross per 24 hour  Intake 1350.73 ml  Output 150 ml  Net 1200.73 ml   Filed Weights   07/10/18 1533 07/10/18 2150  Weight: 104.3 kg 107 kg   Body mass index is 33.85 kg/m.  General:   Well nourished, mildly tachypneic HEENT: normal Lymph: no adenopathy Neck: no JVD Endocrine:  No thryomegaly Vascular: No carotid bruits; FA pulses 2+ bilaterally without bruits  Cardiac:  normal S1, S2; irregularly irregular Lungs:  clear to auscultation  bilaterally, no wheezing, rhonchi or rales  Abd: soft, nontender, no hepatomegaly  Ext: Trace edema Musculoskeletal:  No deformities, BUE and BLE strength normal and equal Skin: warm and dry  Neuro:  CNs 2-12 intact, no focal abnormalities noted Psych:  Normal affect    EKG:  Atrial flutter with 2:1 block    Laboratory Data:  Chemistry Recent Labs  Lab 07/10/18 1421  NA 139  K 4.1  CL 103  CO2 29  GLUCOSE 124*  BUN 21  CREATININE 1.07  CALCIUM 8.4*  GFRNONAA >60  GFRAA >60  ANIONGAP 7    Recent Labs  Lab 07/10/18 1421  PROT 6.1*  ALBUMIN 3.3*  AST 65*  ALT 102*  ALKPHOS 153*  BILITOT 0.9   Hematology Recent Labs  Lab 07/10/18 1421  WBC 7.5  RBC 4.46  HGB 14.1  HCT 43.5  MCV 97.5  MCH 31.6  MCHC 32.4  RDW 13.2  PLT 224   Cardiac Enzymes Recent Labs  Lab 07/10/18 1421  TROPONINI 0.03*   No results for input(s): TROPIPOC in the last 168 hours.  BNP Recent Labs  Lab 07/10/18 1421  BNP 317.2*    DDimer No results for input(s): DDIMER in the last 168 hours.  Radiology/Studies:  Dg Chest Port 1 View  Result Date: 07/10/2018 CLINICAL DATA:  Shortness of breath. EXAM: PORTABLE CHEST 1 VIEW COMPARISON:  04/26/2017 FINDINGS: Significant interstitial thickening in both lungs, particularly in the lower lungs. Focus of interstitial thickening in the right suprahilar region as well. This is a marked change from the prior examination. Heart size is upper limits of normal. The trachea is midline. Bone structures are unremarkable. Negative for a pneumothorax. IMPRESSION: Significant interstitial thickening throughout both lungs, particularly at the lung bases. Differential diagnosis includes interstitial  pulmonary edema versus atypical infection. Electronically Signed   By: Markus Daft M.D.   On: 07/10/2018 14:49    Assessment and Plan:   1. Atrial flutter with rapid ventricular response  -Continue IV diltiazem for rate control -Patient started on Eliquis (Apixaban) at outside hospital. Will continue -Keep patient NPO for possible TEE/cardioversion in AM   2. Elevated troponin (likely due to demand ischemia)  -Continue to cycle cardiac enzymes -Obtain serial ECGs -AV nodal blockers to reduce myocardial workload -Echo to evaluate LV function -ASA 81 mg daily, if no contraindications    Severity of Illness: The appropriate patient status for this patient is INPATIENT. Inpatient status is judged to be reasonable and necessary in order to provide the required intensity of service to ensure the patient's safety. The patient's presenting symptoms, physical exam findings, and initial radiographic and laboratory data in the context of their chronic comorbidities is felt to place them at high risk for further clinical deterioration. Furthermore, it is not anticipated that the patient will be medically stable for discharge from the hospital within 2 midnights of admission. The following factors support the patient status of inpatient.   " The patient's presenting symptoms include SOB. " The worrisome physical exam findings include tachycardia. " The initial radiographic and laboratory data are worrisome because of atrial flutter on ECG. " The chronic co-morbidities include bronchitis.   * I certify that at the point of admission it is my clinical judgment that the patient will require inpatient hospital care spanning beyond 2 midnights from the point of admission due to high intensity of service, high risk for further deterioration and high frequency of surveillance required.*    For questions or  updates, please contact [place] Please consult [email or web address] for contact info under  Cardiology/STEMI.    Signed, Meade Maw, MD  07/10/2018 11:11 PM

## 2018-07-10 NOTE — ED Notes (Signed)
Pt HR at 150./ EDP made aware. Verbal order to restart Cardizem.

## 2018-07-11 ENCOUNTER — Inpatient Hospital Stay (HOSPITAL_COMMUNITY): Payer: Medicare Other

## 2018-07-11 DIAGNOSIS — I351 Nonrheumatic aortic (valve) insufficiency: Secondary | ICD-10-CM

## 2018-07-11 DIAGNOSIS — I483 Typical atrial flutter: Principal | ICD-10-CM

## 2018-07-11 DIAGNOSIS — J189 Pneumonia, unspecified organism: Secondary | ICD-10-CM

## 2018-07-11 LAB — BASIC METABOLIC PANEL
Anion gap: 9 (ref 5–15)
BUN: 20 mg/dL (ref 8–23)
CALCIUM: 8.3 mg/dL — AB (ref 8.9–10.3)
CO2: 26 mmol/L (ref 22–32)
CREATININE: 1.1 mg/dL (ref 0.61–1.24)
Chloride: 106 mmol/L (ref 98–111)
GFR calc Af Amer: >60 mL/min (ref >60)
GFR calc non Af Amer: >60 mL/min (ref >60)
Glucose, Bld: 134 mg/dL — ABNORMAL HIGH (ref 70–99)
Potassium: 3.9 mmol/L (ref 3.5–5.1)
Sodium: 141 mmol/L (ref 135–145)

## 2018-07-11 LAB — CBC
HCT: 40.2 % (ref 39.0–52.0)
Hemoglobin: 13.3 g/dL (ref 13.0–17.0)
MCH: 32.6 pg (ref 26.0–34.0)
MCHC: 33.1 g/dL (ref 30.0–36.0)
MCV: 98.5 fL (ref 80.0–100.0)
Platelets: 220 10*3/uL (ref 150–400)
RBC: 4.08 MIL/uL — ABNORMAL LOW (ref 4.22–5.81)
RDW: 13.3 % (ref 11.5–15.5)
WBC: 7.8 10*3/uL (ref 4.0–10.5)
nRBC: 0 % (ref 0.0–0.2)

## 2018-07-11 LAB — ECHOCARDIOGRAM COMPLETE
Height: 70 in
Weight: 3774.28 oz

## 2018-07-11 LAB — MRSA PCR SCREENING: MRSA by PCR: NEGATIVE

## 2018-07-11 LAB — PROTIME-INR
INR: 1.43
Prothrombin Time: 17.2 seconds — ABNORMAL HIGH (ref 11.4–15.2)

## 2018-07-11 LAB — TROPONIN I: Troponin I: <0.03 ng/mL (ref <0.03)

## 2018-07-11 MED ORDER — SODIUM CHLORIDE 0.9 % IV SOLN
1.0000 g | INTRAVENOUS | Status: AC
  Administered 2018-07-11 – 2018-07-14 (×4): 1 g via INTRAVENOUS
  Filled 2018-07-11 (×4): qty 10

## 2018-07-11 MED ORDER — AZITHROMYCIN 250 MG PO TABS
500.0000 mg | ORAL_TABLET | Freq: Every day | ORAL | Status: AC
  Administered 2018-07-11 – 2018-07-14 (×4): 500 mg via ORAL
  Filled 2018-07-11 (×4): qty 2
  Filled 2018-07-11: qty 1

## 2018-07-11 MED ORDER — LEVALBUTEROL HCL 0.63 MG/3ML IN NEBU
1.2500 mg | INHALATION_SOLUTION | RESPIRATORY_TRACT | Status: DC | PRN
  Administered 2018-07-11: 22:00:00 via RESPIRATORY_TRACT
  Administered 2018-07-13: 1.25 mg via RESPIRATORY_TRACT
  Filled 2018-07-11 (×2): qty 6

## 2018-07-11 MED ORDER — ALBUTEROL SULFATE (2.5 MG/3ML) 0.083% IN NEBU
2.5000 mg | INHALATION_SOLUTION | Freq: Four times a day (QID) | RESPIRATORY_TRACT | Status: DC | PRN
  Filled 2018-07-11: qty 3

## 2018-07-11 MED ORDER — LEVALBUTEROL HCL 0.63 MG/3ML IN NEBU: 0.6300 mg | INHALATION_SOLUTION | Freq: Four times a day (QID) | RESPIRATORY_TRACT | Status: DC

## 2018-07-11 MED ORDER — POLYETHYLENE GLYCOL 3350 17 G PO PACK
17.0000 g | PACK | Freq: Every day | ORAL | Status: DC
  Administered 2018-07-11 – 2018-07-14 (×4): 17 g via ORAL
  Filled 2018-07-11 (×4): qty 1

## 2018-07-11 MED ORDER — LEVALBUTEROL HCL 0.63 MG/3ML IN NEBU
INHALATION_SOLUTION | RESPIRATORY_TRACT | Status: AC
  Filled 2018-07-11: qty 3

## 2018-07-11 MED ORDER — LEVALBUTEROL HCL 0.63 MG/3ML IN NEBU
0.6300 mg | INHALATION_SOLUTION | Freq: Four times a day (QID) | RESPIRATORY_TRACT | Status: DC | PRN
  Administered 2018-07-11: 0.63 mg via RESPIRATORY_TRACT
  Filled 2018-07-11: qty 3

## 2018-07-11 MED ORDER — GUAIFENESIN-DM 100-10 MG/5ML PO SYRP
5.0000 mL | ORAL_SOLUTION | ORAL | Status: DC | PRN
  Administered 2018-07-11 – 2018-07-14 (×11): 5 mL via ORAL
  Filled 2018-07-11 (×10): qty 5

## 2018-07-11 MED ORDER — DILTIAZEM HCL 60 MG PO TABS
90.0000 mg | ORAL_TABLET | Freq: Four times a day (QID) | ORAL | Status: DC
  Administered 2018-07-11 – 2018-07-12 (×4): 90 mg via ORAL
  Filled 2018-07-11 (×4): qty 2

## 2018-07-11 NOTE — Care Management (Signed)
#  9.   S/W  MATT @ [facility] # [phone] OPT- 2   APIXABAN : NONE FORMULARY  1. ELIQUIS  5 MG BID COVER- YES CO-PAY-  $ 327.72 TIER- 3 DRUG PRIOR APPROVAL- NO  2. ELIQUIS    2.5 MG BID COVER- YES CO-PAY- $ 327.72 TIER- 3 DRUG  PRIOR APPROVAL- NO  DEDUCTIBLE : NOT MET  BAL. $ 293.00  PREFERRED PHARMACY  : YES   -  [facility]

## 2018-07-11 NOTE — Progress Notes (Signed)
Pt appeared to have converted to NSR, EKG obtained to confirm. MD on call made aware. Pt remains asymptomatic and vitals stable.

## 2018-07-11 NOTE — Progress Notes (Signed)
  Echocardiogram 2D Echocardiogram has been performed.  Charles Cameron 07/11/2018, 3:16 PM

## 2018-07-11 NOTE — Care Management Obs Status (Deleted)
MEDICARE OBSERVATION STATUS NOTIFICATION   Patient Details  Name: Charles Cameron MRN: [id number] Date of Birth: 05-06-1946   Medicare Observation Status Notification Given:  Yes  Patient has dementia, with her daughter was admitted today to [facility] ED with Sepsis.   Georgeanna Lea, RN 07/11/2018, 3:27 PM

## 2018-07-11 NOTE — Progress Notes (Signed)
Progress Note  Patient Name: Charles Cameron Date of Encounter: 07/11/2018  Primary Cardiologist: Buford Dresser, MD PhD (new)  Subjective   Feeling fatigued, still having frequent coughing. Cannot feel that he went back into NSR. Hungry. Family concerned that when he has severe coughing spells he looks like he stops breathing.  Inpatient Medications    Scheduled Meds: . apixaban  5 mg Oral BID  . mouth rinse  15 mL Mouth Rinse BID  . polyethylene glycol  17 g Oral Daily   Continuous Infusions: . diltiazem (CARDIZEM) infusion 15 mg/hr (07/11/18 0607)  . sodium chloride Stopped (07/10/18 1729)   PRN Meds: acetaminophen, guaiFENesin-dextromethorphan, ondansetron (ZOFRAN) IV   Vital Signs    Vitals:   07/11/18 0600 07/11/18 0700 07/11/18 0800 07/11/18 0813  BP: 124/72 117/71 109/64   Pulse: 62 (!) 58 (!) 59   Resp: 16 17 17    Temp:    97.9 F (36.6 C)  TempSrc:    Oral  SpO2: 94% 97% 97%   Weight:      Height:        Intake/Output Summary (Last 24 hours) at 07/11/2018 0852 Last data filed at 07/11/2018 0400 Gross per 24 hour  Intake 1857.89 ml  Output 450 ml  Net 1407.89 ml   Filed Weights   07/10/18 1533 07/10/18 2150  Weight: 104.3 kg 107 kg    Telemetry    Atrial flutter, gradual rate improvement on dilt drip, converted to SR aroun 1:20 AM - Personally Reviewed  ECG    NSR, RBBB, nonspecific ST changes - Personally Reviewed  Physical Exam   GEN: No acute distress.   Neck: No JVD Cardiac: RRR, no murmurs, rubs, or gallops.  Respiratory: coarse with end expiratory wheezing at bilateral bases GI: Soft, nontender, non-distended  MS: Trace bilateral LE edema; No deformity. Neuro:  Nonfocal  Psych: Normal affect   Labs    Chemistry Recent Labs  Lab 07/10/18 1421 07/11/18 0328  NA 139 141  K 4.1 3.9  CL 103 106  CO2 29 26  GLUCOSE 124* 134*  BUN 21 20  CREATININE 1.07 1.10  CALCIUM 8.4* 8.3*  PROT 6.1*  --   ALBUMIN 3.3*  --    AST 65*  --   ALT 102*  --   ALKPHOS 153*  --   BILITOT 0.9  --   GFRNONAA >60 >60  GFRAA >60 >60  ANIONGAP 7 9     Hematology Recent Labs  Lab 07/10/18 1421 07/11/18 0328  WBC 7.5 7.8  RBC 4.46 4.08*  HGB 14.1 13.3  HCT 43.5 40.2  MCV 97.5 98.5  MCH 31.6 32.6  MCHC 32.4 33.1  RDW 13.2 13.3  PLT 224 220    Cardiac Enzymes Recent Labs  Lab 07/10/18 1421 07/11/18 0328  TROPONINI 0.03* <0.03   No results for input(s): TROPIPOC in the last 168 hours.   BNP Recent Labs  Lab 07/10/18 1421  BNP 317.2*     DDimer No results for input(s): DDIMER in the last 168 hours.   Radiology    Dg Chest Port 1 View  Result Date: 07/10/2018 CLINICAL DATA:  Shortness of breath. EXAM: PORTABLE CHEST 1 VIEW COMPARISON:  04/26/2017 FINDINGS: Significant interstitial thickening in both lungs, particularly in the lower lungs. Focus of interstitial thickening in the right suprahilar region as well. This is a marked change from the prior examination. Heart size is upper limits of normal. The trachea is midline. Bone structures are  unremarkable. Negative for a pneumothorax. IMPRESSION: Significant interstitial thickening throughout both lungs, particularly at the lung bases. Differential diagnosis includes interstitial pulmonary edema versus atypical infection. Electronically Signed   By: Markus Daft M.D.   On: 07/10/2018 14:49    Cardiac Studies   Echo pending  Patient Profile     72 y.o. male with PMH of recent URI/bronchitis, history of prostate cancer who presented with near syncope. Found to be in new atrial flutter, initially 2:1 conduction, now converted to NSR on dilt drip.  Assessment & Plan    Atrial flutter: initially RVR, now converted to NSR. -continue apixaban for anticoagulation.  chadsvasc is only 1, but will continue post discharge in case of need for cardioversion or possible ablation -suspect this may have been triggered by URI. Once URI resolved, would consider 2  week Zio monitor to determine if there is recurrence of flutter (and would then refer for ablation) -gave him a diet as he does not need cardioversion now -troponin borderline elevated, suspect due to demand from RVR. Echo pending -will transition him to oral diltiazem (current rate is 15/hr, or 360 mg/daily). Will start with short acting diltiazem for now pending echo, and then if EF normal can consolidate. -ok to transfer to telemetry  URI: has been prolonged, concern for atypical pneumonia on CXR -flu negative -received azithromycin and ceftriaxone in ER. Will continue azithromycin and beta lactam for 5 day total treatment -wheezing on exam. Concern is that albuterol will raise heart rate, but will try to see if it helps -ordered guaifenisin/DM syrup  Constipation: -ordered miralax  For questions or updates, please contact [place] [facility] Please consult [email or web address] for contact info under     Signed, Buford Dresser, MD  07/11/2018, 8:52 AM

## 2018-07-12 LAB — CBC
HCT: 42.5 % (ref 39.0–52.0)
Hemoglobin: 14.1 g/dL (ref 13.0–17.0)
MCH: 32.5 pg (ref 26.0–34.0)
MCHC: 33.2 g/dL (ref 30.0–36.0)
MCV: 97.9 fL (ref 80.0–100.0)
Platelets: 197 10*3/uL (ref 150–400)
RBC: 4.34 MIL/uL (ref 4.22–5.81)
RDW: 13.3 % (ref 11.5–15.5)
WBC: 8.1 10*3/uL (ref 4.0–10.5)
nRBC: 0 % (ref 0.0–0.2)

## 2018-07-12 LAB — BASIC METABOLIC PANEL
ANION GAP: 11 (ref 5–15)
BUN: 15 mg/dL (ref 8–23)
CO2: 26 mmol/L (ref 22–32)
Calcium: 8.5 mg/dL — ABNORMAL LOW (ref 8.9–10.3)
Chloride: 102 mmol/L (ref 98–111)
Creatinine, Ser: 1.03 mg/dL (ref 0.61–1.24)
GFR calc Af Amer: >60 mL/min (ref >60)
GFR calc non Af Amer: >60 mL/min (ref >60)
Glucose, Bld: 116 mg/dL — ABNORMAL HIGH (ref 70–99)
Potassium: 3.8 mmol/L (ref 3.5–5.1)
Sodium: 139 mmol/L (ref 135–145)

## 2018-07-12 LAB — MAGNESIUM: Magnesium: 2.1 mg/dL (ref 1.7–2.4)

## 2018-07-12 MED ORDER — DILTIAZEM HCL ER COATED BEADS 180 MG PO CP24
360.0000 mg | ORAL_CAPSULE | Freq: Every day | ORAL | Status: DC
  Administered 2018-07-12 – 2018-07-14 (×3): 360 mg via ORAL
  Filled 2018-07-12 (×3): qty 2

## 2018-07-12 NOTE — Plan of Care (Signed)

## 2018-07-12 NOTE — Progress Notes (Signed)
Progress Note  Patient Name: Charles Cameron Date of Encounter: 07/12/2018  Primary Cardiologist: Buford Dresser, MD PhD (new)  Subjective   Went back into atrial flutter around 7 AM this morning after being in NSR. Feels more fatigued when in flutter. Heart rates going to 110-120 just eating breakfast. No chest pain.   Seen again in the afternoon, ambulated through the halls. O2 sat on oxygen was around 90% when reconnected, dropped into the upper 80s with conversation on O2.  Inpatient Medications    Scheduled Meds: . apixaban  5 mg Oral BID  . azithromycin  500 mg Oral Daily  . diltiazem  360 mg Oral Daily  . mouth rinse  15 mL Mouth Rinse BID  . polyethylene glycol  17 g Oral Daily   Continuous Infusions: . cefTRIAXone (ROCEPHIN)  IV 1 g (07/11/18 1208)  . sodium chloride Stopped (07/10/18 1729)   PRN Meds: acetaminophen, guaiFENesin-dextromethorphan, levalbuterol, ondansetron (ZOFRAN) IV   Vital Signs    Vitals:   07/11/18 2110 07/11/18 2114 07/12/18 0601 07/12/18 0602  BP: 131/82  125/76 125/76  Pulse: 65   66  Resp:      Temp: 97.6 F (36.4 C)   97.7 F (36.5 C)  TempSrc: Oral   Oral  SpO2: 95%   96%  Weight:  109.5 kg    Height:        Intake/Output Summary (Last 24 hours) at 07/12/2018 1324 Last data filed at 07/11/2018 2000 Gross per 24 hour  Intake 14.95 ml  Output 550 ml  Net -535.05 ml   Filed Weights   07/10/18 1533 07/10/18 2150 07/11/18 2114  Weight: 104.3 kg 107 kg 109.5 kg    Telemetry    Went from NSR back into atrial flutter this AM, gradually improving ventricular rates. Personally Reviewed  ECG    NSR, RBBB, nonspecific ST changes 12/30 - Personally Reviewed  Physical Exam   GEN: No acute distress.  [place] in place. Appeared to have more color in his face in the afternoon Neck: No JVD Cardiac: irregularly irregular S1 and S2, no murmurs, rubs, or gallops.  Respiratory: coarse bilaterally, no wheezing on exam  today GI: Soft, nontender, non-distended  MS: Trace bilateral LE edema; No deformity. Neuro:  Nonfocal  Psych: Normal affect   Labs    Chemistry Recent Labs  Lab 07/10/18 1421 07/11/18 0328  NA 139 141  K 4.1 3.9  CL 103 106  CO2 29 26  GLUCOSE 124* 134*  BUN 21 20  CREATININE 1.07 1.10  CALCIUM 8.4* 8.3*  PROT 6.1*  --   ALBUMIN 3.3*  --   AST 65*  --   ALT 102*  --   ALKPHOS 153*  --   BILITOT 0.9  --   GFRNONAA >60 >60  GFRAA >60 >60  ANIONGAP 7 9     Hematology Recent Labs  Lab 07/10/18 1421 07/11/18 0328  WBC 7.5 7.8  RBC 4.46 4.08*  HGB 14.1 13.3  HCT 43.5 40.2  MCV 97.5 98.5  MCH 31.6 32.6  MCHC 32.4 33.1  RDW 13.2 13.3  PLT 224 220    Cardiac Enzymes Recent Labs  Lab 07/10/18 1421 07/11/18 0328  TROPONINI 0.03* <0.03   No results for input(s): TROPIPOC in the last 168 hours.   BNP Recent Labs  Lab 07/10/18 1421  BNP 317.2*     DDimer No results for input(s): DDIMER in the last 168 hours.   Radiology  Dg Chest Port 1 View  Result Date: 07/10/2018 CLINICAL DATA:  Shortness of breath. EXAM: PORTABLE CHEST 1 VIEW COMPARISON:  04/26/2017 FINDINGS: Significant interstitial thickening in both lungs, particularly in the lower lungs. Focus of interstitial thickening in the right suprahilar region as well. This is a marked change from the prior examination. Heart size is upper limits of normal. The trachea is midline. Bone structures are unremarkable. Negative for a pneumothorax. IMPRESSION: Significant interstitial thickening throughout both lungs, particularly at the lung bases. Differential diagnosis includes interstitial pulmonary edema versus atypical infection. Electronically Signed   By: Markus Daft M.D.   On: 07/10/2018 14:49    Cardiac Studies   Echo 12/30 - Left ventricle: The cavity size was normal. Wall thickness was   increased in a pattern of mild LVH. Systolic function was normal.   The estimated ejection fraction was in the  range of 55% to 60%.   Wall motion was normal; there were no regional wall motion   abnormalities. There was no evidence of elevated ventricular   filling pressure by Doppler parameters. - Aortic valve: Trileaflet; moderately thickened, moderately   calcified leaflets. Valve mobility was restricted. There was mild   regurgitation. - Mitral valve: There was moderate regurgitation. - Right ventricle: The cavity size was mildly dilated. Wall   thickness was normal. - Right atrium: The atrium was mildly dilated.  Patient Profile     72 y.o. male with PMH of recent URI/bronchitis, history of prostate cancer who presented with near syncope. Found to be in new atrial flutter, initially 2:1 conduction, initially converted to NSR on drip, then back to aflutter.  Assessment & Plan    Atrial flutter: initially RVR, then NSR, now back to flutter -continue apixaban for anticoagulation.  -no availability for anesthesia today for TEE/CV; would prefer not to wait till 1/2 for procedure if he can go home, but amenable if he needs to stay. -chadsvasc is only 1, but will continue in case of need for cardioversion or possible ablation -echo with normal LV EF, mild AR, moderate MR.  -consolidated into long acting dilitazem with improved rate control. Staying below 100 with exertion  URI: has been prolonged, concern for atypical pneumonia on CXR -flu negative -received azithromycin and ceftriaxone in ER. Will continue azithromycin and beta lactam for 5 day total treatment -wheezing improved with albuterol (does not appearing to be worsening tachycardia) -guaifenisin/DM syrup -has new O2 requirement, especially with exertion or speech. Tomorrow AM, I would walk him without O2 (but tank following behind) and monitor his heart rate and O2. I suspect this will be a short term O2 need due to the lung infection. -no wheezing today, but if recurs would consider a short course of steroids.  Constipation: BM today  with miralax  TIME SPENT WITH PATIENT: >40 minutes of direct patient care, across two visits. More than 50% of that time was spent on coordination of care and counseling regarding flutter management, O2 requirement.  Buford Dresser, MD, PhD [facility]   For questions or updates, please contact [place] Please consult [email or web address] for contact info under     Signed, Buford Dresser, MD  07/12/2018, 8:22 AM

## 2018-07-13 ENCOUNTER — Inpatient Hospital Stay (HOSPITAL_COMMUNITY): Payer: Medicare Other

## 2018-07-13 MED ORDER — DRONEDARONE HCL 400 MG PO TABS
400.0000 mg | ORAL_TABLET | Freq: Two times a day (BID) | ORAL | Status: DC
  Administered 2018-07-13 – 2018-07-14 (×3): 400 mg via ORAL
  Filled 2018-07-13 (×3): qty 1

## 2018-07-13 MED ORDER — FUROSEMIDE 40 MG PO TABS
40.0000 mg | ORAL_TABLET | Freq: Every day | ORAL | Status: DC
  Administered 2018-07-13: 40 mg via ORAL
  Filled 2018-07-13: qty 1

## 2018-07-13 MED ORDER — METOPROLOL TARTRATE 5 MG/5ML IV SOLN: 2.5000 mg | INTRAVENOUS | Status: DC

## 2018-07-13 MED ORDER — DILTIAZEM HCL 25 MG/5ML IV SOLN
15.0000 mg | Freq: Once | INTRAVENOUS | Status: AC
  Administered 2018-07-13: 15 mg via INTRAVENOUS
  Filled 2018-07-13: qty 5

## 2018-07-13 MED ORDER — FUROSEMIDE 10 MG/ML IJ SOLN
40.0000 mg | Freq: Two times a day (BID) | INTRAMUSCULAR | Status: DC
  Administered 2018-07-13 – 2018-07-14 (×2): 40 mg via INTRAVENOUS
  Filled 2018-07-13 (×2): qty 4

## 2018-07-13 MED ORDER — METOPROLOL TARTRATE 5 MG/5ML IV SOLN
2.5000 mg | INTRAVENOUS | Status: AC
  Administered 2018-07-13: 2.5 mg via INTRAVENOUS
  Filled 2018-07-13: qty 5

## 2018-07-13 NOTE — Progress Notes (Signed)
Progress Note  Patient Name: Charles Cameron Date of Encounter: 07/13/2018  Primary Cardiologist: No primary care provider on file. [place] Gave)  Subjective   Feels better. Walked hall without O2 and w normal O2 sat. Seems to be in atrial fibrillation today. During exam and conversation spontaneously went back to NSR in 70s for 20-30 seconds at a time, at least 3 times  Inpatient Medications    Scheduled Meds: . apixaban  5 mg Oral BID  . azithromycin  500 mg Oral Daily  . diltiazem  360 mg Oral Daily  . mouth rinse  15 mL Mouth Rinse BID  . polyethylene glycol  17 g Oral Daily   Continuous Infusions: . cefTRIAXone (ROCEPHIN)  IV Stopped (07/12/18 1150)  . sodium chloride Stopped (07/10/18 1729)   PRN Meds: acetaminophen, guaiFENesin-dextromethorphan, levalbuterol, ondansetron (ZOFRAN) IV   Vital Signs    Vitals:   07/12/18 0856 07/12/18 1448 07/12/18 2135 07/13/18 0558  BP: 126/74 125/82 124/79 (!) 146/91  Pulse:  62 63 (!) 112  Resp:   17 16  Temp:  98.2 F (36.8 C) 98.3 F (36.8 C) 98.4 F (36.9 C)  TempSrc:  Oral    SpO2:  98% 95% 93%  Weight:    109 kg  Height:        Intake/Output Summary (Last 24 hours) at 07/13/2018 0956 Last data filed at 07/13/2018 8563 Gross per 24 hour  Intake 1180 ml  Output 1895 ml  Net -715 ml   Filed Weights   07/10/18 2150 07/11/18 2114 07/13/18 0558  Weight: 107 kg 109.5 kg 109 kg    Telemetry    AFib, rate around 100, occ brief NSR - Personally Reviewed  ECG    Typical counterclockwise AFlutter w variable AV block - Personally Reviewed  Physical Exam  Obese GEN: No acute distress.   Neck: No JVD Cardiac: irregular, no murmurs, rubs, or gallops.  Respiratory: Clear to auscultation bilaterally. GI: Soft, nontender, non-distended  MS: No edema; No deformity. Neuro:  Nonfocal  Psych: Normal affect   Labs    Chemistry Recent Labs  Lab 07/10/18 1421 07/11/18 0328 07/12/18 0824  NA 139 141 139  K  4.1 3.9 3.8  CL 103 106 102  CO2 29 26 26   GLUCOSE 124* 134* 116*  BUN 21 20 15   CREATININE 1.07 1.10 1.03  CALCIUM 8.4* 8.3* 8.5*  PROT 6.1*  --   --   ALBUMIN 3.3*  --   --   AST 65*  --   --   ALT 102*  --   --   ALKPHOS 153*  --   --   BILITOT 0.9  --   --   GFRNONAA >60 >60 >60  GFRAA >60 >60 >60  ANIONGAP 7 9 11      Hematology Recent Labs  Lab 07/10/18 1421 07/11/18 0328 07/12/18 0824  WBC 7.5 7.8 8.1  RBC 4.46 4.08* 4.34  HGB 14.1 13.3 14.1  HCT 43.5 40.2 42.5  MCV 97.5 98.5 97.9  MCH 31.6 32.6 32.5  MCHC 32.4 33.1 33.2  RDW 13.2 13.3 13.3  PLT 224 220 197    Cardiac Enzymes Recent Labs  Lab 07/10/18 1421 07/11/18 0328  TROPONINI 0.03* <0.03   No results for input(s): TROPIPOC in the last 168 hours.   BNP Recent Labs  Lab 07/10/18 1421  BNP 317.2*     DDimer No results for input(s): DDIMER in the last 168 hours.   Radiology  No results found.  Cardiac Studies   Echo 12/30 - Left ventricle: The cavity size was normal. Wall thickness was increased in a pattern of mild LVH. Systolic function was normal. The estimated ejection fraction was in the range of 55% to 60%. Wall motion was normal; there were no regional wall motion abnormalities. There was no evidence of elevated ventricular filling pressure by Doppler parameters. - Aortic valve: Trileaflet; moderately thickened, moderately calcified leaflets. Valve mobility was restricted. There was mild regurgitation. - Mitral valve: There was moderate regurgitation. - Right ventricle: The cavity size was mildly dilated. Wall thickness was normal. - Right atrium: The atrium was mildly dilated.   Patient Profile     73 y.o. male with newly recognized asymptomatic atrial flutter during respiratory illness, near-syncope during cough paroxysm. Now appears to be in atrial fibrillation with mild RVR, frequent brief return to NSR without evidence of post-conversion  pauses.  Assessment & Plan    1. AFlutter/ AFib: check ECG to confirm atrial fibrillation. DCCV would not help since he is repeatedly spontaneously returning to atrial arrhythmia. No evidence of post conversion pauses. Different antiarrhythmic options reviewed. Start Multaq to avoid lengthy hospitalization for dofetilide/sotalol and avvoid amiodarone side effects. Flecainide would be another option, as he does not have known CAD. He is anticoagulated.  Respiratory infection is clearing up. Hopefully this will also reduce the burden of arrhythmia.  For questions or updates, please contact [place] Please consult [email or web address] for contact info under        Signed, Sanda Klein, MD  07/13/2018, 9:56 AM

## 2018-07-13 NOTE — Care Management Important Message (Signed)
Important Message  Patient Details  Name: Charles Cameron MRN: [id number] Date of Birth: 1946-03-30   Medicare Important Message Given:  Yes    Barb Merino Baldwin 07/13/2018, 12:49 PM

## 2018-07-13 NOTE — Progress Notes (Signed)
Pt states his bilateral LE are a lot more swollen today than yesterday. More short of breath today. Kindly address. Will update MD.

## 2018-07-13 NOTE — Plan of Care (Signed)

## 2018-07-14 ENCOUNTER — Encounter (HOSPITAL_COMMUNITY): Payer: Self-pay | Admitting: Cardiology

## 2018-07-14 DIAGNOSIS — J069 Acute upper respiratory infection, unspecified: Secondary | ICD-10-CM

## 2018-07-14 DIAGNOSIS — R609 Edema, unspecified: Secondary | ICD-10-CM

## 2018-07-14 DIAGNOSIS — Z7901 Long term (current) use of anticoagulants: Secondary | ICD-10-CM

## 2018-07-14 HISTORY — DX: Long term (current) use of anticoagulants: Z79.01

## 2018-07-14 HISTORY — DX: Acute upper respiratory infection, unspecified: J06.9

## 2018-07-14 HISTORY — DX: Edema, unspecified: R60.9

## 2018-07-14 LAB — TSH: TSH: 1.914 u[IU]/mL (ref 0.350–4.500)

## 2018-07-14 LAB — COMPREHENSIVE METABOLIC PANEL
ALT: 49 U/L — ABNORMAL HIGH (ref 0–44)
AST: 28 U/L (ref 15–41)
Albumin: 3.1 g/dL — ABNORMAL LOW (ref 3.5–5.0)
Alkaline Phosphatase: 122 U/L (ref 38–126)
Anion gap: 11 (ref 5–15)
BUN: 13 mg/dL (ref 8–23)
CO2: 24 mmol/L (ref 22–32)
Calcium: 8.8 mg/dL — ABNORMAL LOW (ref 8.9–10.3)
Chloride: 104 mmol/L (ref 98–111)
Creatinine, Ser: 1.17 mg/dL (ref 0.61–1.24)
GFR calc Af Amer: >60 mL/min (ref >60)
GFR calc non Af Amer: >60 mL/min (ref >60)
Glucose, Bld: 180 mg/dL — ABNORMAL HIGH (ref 70–99)
Potassium: 3.6 mmol/L (ref 3.5–5.1)
Sodium: 139 mmol/L (ref 135–145)
Total Bilirubin: 0.9 mg/dL (ref 0.3–1.2)
Total Protein: 6.2 g/dL — ABNORMAL LOW (ref 6.5–8.1)

## 2018-07-14 MED ORDER — METOPROLOL TARTRATE 25 MG PO TABS: 25.0000 mg | ORAL_TABLET | Freq: Two times a day (BID) | ORAL | 6 refills | Status: DC

## 2018-07-14 MED ORDER — ACETAMINOPHEN 325 MG PO TABS: 650.0000 mg | ORAL_TABLET | ORAL | Status: DC | PRN

## 2018-07-14 MED ORDER — APIXABAN 5 MG PO TABS: 5.0000 mg | ORAL_TABLET | Freq: Two times a day (BID) | ORAL | 6 refills | Status: DC

## 2018-07-14 MED ORDER — FUROSEMIDE 40 MG PO TABS: 40.0000 mg | ORAL_TABLET | Freq: Every day | ORAL | 6 refills | Status: DC

## 2018-07-14 MED ORDER — DRONEDARONE HCL 400 MG PO TABS: 400.0000 mg | ORAL_TABLET | Freq: Two times a day (BID) | ORAL | 6 refills | Status: DC

## 2018-07-14 MED ORDER — FUROSEMIDE 40 MG PO TABS: 40.0000 mg | ORAL_TABLET | Freq: Every day | ORAL | Status: DC

## 2018-07-14 MED ORDER — POLYETHYLENE GLYCOL 3350 17 G PO PACK: 17.0000 g | PACK | Freq: Every day | ORAL | 0 refills | Status: DC

## 2018-07-14 MED ORDER — GUAIFENESIN-DM 100-10 MG/5ML PO SYRP: 5.0000 mL | ORAL_SOLUTION | ORAL | 0 refills | Status: DC | PRN

## 2018-07-14 MED ORDER — METOPROLOL TARTRATE 12.5 MG HALF TABLET
12.5000 mg | ORAL_TABLET | Freq: Four times a day (QID) | ORAL | Status: DC
  Administered 2018-07-14 (×2): 12.5 mg via ORAL
  Filled 2018-07-14 (×2): qty 1

## 2018-07-14 MED ORDER — DILTIAZEM HCL ER COATED BEADS 360 MG PO CP24: 360.0000 mg | ORAL_CAPSULE | Freq: Every day | ORAL | 6 refills | Status: DC

## 2018-07-14 MED FILL — FUROSEMIDE 40 MG TABLET: 40 | 30 days supply | Qty: 30 | Fill #0 | Status: TO

## 2018-07-14 MED FILL — SM TUSSIN DM SYRUP: 100-10 | 8 days supply | Qty: 236 | Fill #0

## 2018-07-14 MED FILL — METOPROLOL TARTRATE 25 MG T: 25 | 30 days supply | Qty: 60 | Fill #0 | Status: TO

## 2018-07-14 MED FILL — CARTIA XT 180 MG CAPSULE SA: 180 | 30 days supply | Qty: 60 | Fill #0 | Status: TO

## 2018-07-14 MED FILL — POLYETHYLENE GLYCOL 3350 PO: 14 days supply | Qty: 238 | Fill #0

## 2018-07-14 MED FILL — ELIQUIS 5 MG TABLET: 5 | 30 days supply | Qty: 60 | Fill #0 | Status: TO

## 2018-07-14 NOTE — Progress Notes (Signed)
Patient  Converted to SR at 11:45 and sustains until now. Will continue to monitor patient.

## 2018-07-14 NOTE — Plan of Care (Signed)

## 2018-07-14 NOTE — Discharge Summary (Signed)
Discharge Summary    Patient ID: Charles Cameron MRN: [id number]; DOB: [birth year]  Admit date: 07/10/2018 Discharge date: 07/14/2018  Primary Care Provider: Rodena Medin, MD (Inactive)  Primary Cardiologist: Buford Dresser, MD  Primary Electrophysiologist:  None   Discharge Diagnoses    Principal Problem:   Atrial flutter with rapid ventricular response [facility]) Active Problems:   Atrial flutter ([place])   URI (upper respiratory infection)   Edema   Anticoagulation adequate   Allergies No Known Allergies  Diagnostic Studies/Procedures    Echo 07/11/2018 LV EF: 55% -   60% Study Conclusions  - Left ventricle: The cavity size was normal. Wall thickness was   increased in a pattern of mild LVH. Systolic function was normal.   The estimated ejection fraction was in the range of 55% to 60%.   Wall motion was normal; there were no regional wall motion   abnormalities. There was no evidence of elevated ventricular   filling pressure by Doppler parameters. - Aortic valve: Trileaflet; moderately thickened, moderately   calcified leaflets. Valve mobility was restricted. There was mild   regurgitation. - Mitral valve: There was moderate regurgitation. - Right ventricle: The cavity size was mildly dilated. Wall   thickness was normal. - Right atrium: The atrium was mildly dilated.  _____________   History of Present Illness     Charles Cameron is a 73 y.o. male with a history of recent bronchitis, seasonal allergies and prostate CA who presents with complaints of shortness of breath and a near-syncopal event at home. The patient states that he has been struggling with URI symptoms for at least 2 weeks. He has been treated with nebulizers and steroids without much relief. Yesterday he had a bout of coughing where he could not catch his breath. He was transiently not following commands (as reported by his wife) without actually passing out. This lasted for a few seconds and  then resolved spontaneously. He therefore presented to the hospital where he was found to be in atrial flutter with 2:1 block and a heart rate in the 150s. He was treated with Diltiazem bolus and infusion. He was also given Azithromycin for ? atypical PNA.  The patient denies any prior episodes of atrial flutter or fibrillation. He does not endorse any chest pain, orthopnea or PND. He has had some mild leg swelling.  In the ED, after receiving 5mg  of IV metoprolol the patient had a second presyncopal episode. He had difficulty catching his breath. The RN noted that his face turned red and he became diaphoretic. Telemetry revealed transient decrease in the heart rate (at one point with 4:1 AV block) and low SBP of 80s. He improved with IV hydration. The heart rate returned to the 150s and he felt better.    History reviewed. No pertinent past medical history.  Hospital Course     Consultants: N/A   Patient was admitted to cardiology service.  He was started on Eliquis for anticoagulation.  Since the trigger for the episode of atrial flutter flutter was URI, once URI is resolved, may consider 2 weeks of Zio monitor to determine if there is any recurrence of atrial flutter.  If there is recurrence, then will refer the patient for ablation.  He was started on diltiazem for rate control.  He was placed on 360 mg of diltiazem CD.  He also received azithromycin and ceftriaxone for the URI.  Echocardiogram obtained on 07/11/2018 showed EF 55 to 60%, mild LVH, mild AI, moderate MR.  amiodarone was avoided due to possible side effects.  He was started on Multaq 400 mg twice daily for rhythm control.  He did have an episode of lower extremity edema on 07/13/2018, this was treated with IV Lasix with good urinary output.  He successfully converted to sinus rhythm on Multaq on 07/14/2018, he is deemed stable for discharge from cardiology perspective.  I have called his insurance company and did the prior authorization  over the phone.  See case number in separate note.  Patient has follow-up with Dr. Buford Dresser in the cardiology office on January 6.  _____________  Discharge Vitals Blood pressure 115/72, pulse (!) 56, temperature 98.3 F (36.8 C), temperature source Oral, resp. rate 16, height 5\' 10"  (1.778 m), weight 104.5 kg, SpO2 93 %.  Filed Weights   07/11/18 2114 07/13/18 0558 07/14/18 0447  Weight: 109.5 kg 109 kg 104.5 kg    Labs & Radiologic Studies    CBC Recent Labs    07/12/18 0824  WBC 8.1  HGB 14.1  HCT 42.5  MCV 97.9  PLT 470   Basic Metabolic Panel Recent Labs    07/12/18 0824 07/14/18 1026  NA 139 139  K 3.8 3.6  CL 102 104  CO2 26 24  GLUCOSE 116* 180*  BUN 15 13  CREATININE 1.03 1.17  CALCIUM 8.5* 8.8*  MG 2.1  --    Liver Function Tests Recent Labs    07/14/18 1026  AST 28  ALT 49*  ALKPHOS 122  BILITOT 0.9  PROT 6.2*  ALBUMIN 3.1*   No results for input(s): LIPASE, AMYLASE in the last 72 hours. Cardiac Enzymes No results for input(s): CKTOTAL, CKMB, CKMBINDEX, TROPONINI in the last 72 hours. BNP Invalid input(s): POCBNP D-Dimer No results for input(s): DDIMER in the last 72 hours. Hemoglobin A1C No results for input(s): HGBA1C in the last 72 hours. Fasting Lipid Panel No results for input(s): CHOL, HDL, LDLCALC, TRIG, CHOLHDL, LDLDIRECT in the last 72 hours. Thyroid Function Tests Recent Labs    07/14/18 1026  TSH 1.914   _____________  Dg Chest 2 View  Result Date: 07/13/2018 CLINICAL DATA:  Acute onset of bilateral lower extremity swelling and shortness of breath. Atrial fibrillation. EXAM: CHEST - 2 VIEW COMPARISON:  Chest radiograph performed 07/10/2018 FINDINGS: The lungs are well-aerated. Vascular congestion is noted. Increased interstitial markings raise concern for mild interstitial edema. Small bilateral pleural effusions are noted. There is no evidence of pneumothorax. The heart is normal in size; the mediastinal contour  is within normal limits. No acute osseous abnormalities are seen. IMPRESSION: Vascular congestion. Increased interstitial markings raise concern for mild interstitial edema. Small bilateral pleural effusions noted. Electronically Signed   By: Garald Balding M.D.   On: 07/13/2018 21:15   Dg Chest Port 1 View  Result Date: 07/10/2018 CLINICAL DATA:  Shortness of breath. EXAM: PORTABLE CHEST 1 VIEW COMPARISON:  04/26/2017 FINDINGS: Significant interstitial thickening in both lungs, particularly in the lower lungs. Focus of interstitial thickening in the right suprahilar region as well. This is a marked change from the prior examination. Heart size is upper limits of normal. The trachea is midline. Bone structures are unremarkable. Negative for a pneumothorax. IMPRESSION: Significant interstitial thickening throughout both lungs, particularly at the lung bases. Differential diagnosis includes interstitial pulmonary edema versus atypical infection. Electronically Signed   By: Markus Daft M.D.   On: 07/10/2018 14:49   Disposition   Pt is being discharged home today in good condition.  Follow-up Plans & Appointments    Follow-up Information    [facility] [place] Follow up on 07/18/2018.   Specialty:  Cardiology Why:  at 8:00 with Dr. Myrlene Broker information: [address] [place] [phone]           Discharge Medications   Allergies as of 07/14/2018   No Known Allergies     Medication List    STOP taking these medications   cyclobenzaprine 10 MG tablet Commonly known as:  FLEXERIL     TAKE these medications   acetaminophen 325 MG tablet Commonly known as:  TYLENOL Take 2 tablets (650 mg total) by mouth every 4 (four) hours as needed for headache or mild pain.   albuterol 108 (90 Base) MCG/ACT inhaler Commonly known as:  PROVENTIL HFA;VENTOLIN HFA Inhale 2 puffs into the lungs every 4 (four) hours as needed for  wheezing.   apixaban 5 MG Tabs tablet Commonly known as:  ELIQUIS Take 1 tablet (5 mg total) by mouth 2 (two) times daily.   diltiazem 360 MG 24 hr capsule Commonly known as:  CARDIZEM CD Take 1 capsule (360 mg total) by mouth daily. Start taking on:  July 15, 2018   dronedarone 400 MG tablet Commonly known as:  MULTAQ Take 1 tablet (400 mg total) by mouth 2 (two) times daily with a meal.   fluticasone 50 MCG/ACT nasal spray Commonly known as:  FLONASE Place 1 spray into both nostrils daily.   furosemide 40 MG tablet Commonly known as:  LASIX Take 1 tablet (40 mg total) by mouth daily. Start taking on:  July 15, 2018   guaiFENesin-dextromethorphan 100-10 MG/5ML syrup Commonly known as:  ROBITUSSIN DM Take 5 mLs by mouth every 4 (four) hours as needed for cough.   metoprolol tartrate 25 MG tablet Commonly known as:  LOPRESSOR Take 1 tablet (25 mg total) by mouth every 12 (twelve) hours.   polyethylene glycol packet Commonly known as:  MIRALAX / GLYCOLAX Take 17 g by mouth daily. Start taking on:  July 15, 2018        Acute coronary syndrome (MI, NSTEMI, STEMI, etc) this admission?: No.    Outstanding Labs/Studies   N/A  Duration of Discharge Encounter   Greater than 30 minutes including physician time.  Hilbert Corrigan, PA 07/14/2018, 5:41 PM

## 2018-07-14 NOTE — Progress Notes (Signed)
Spoke with Almyra Deforest PA about prior auth for Multaq. He sent prescription to pharm and will address prior auth.  Prior Authorization # [id number], pt ID# [id number]. Carroll Kinds RN

## 2018-07-14 NOTE — Progress Notes (Signed)
Patient discharged to home with wife. Discharge instruction given to patient/wife, all questions answered and concerns addressed. Patient/wife verbalized understanding.

## 2018-07-14 NOTE — Progress Notes (Signed)
I have called [employer]. Multaq approved, case number [id number]  SignedAlmyra Deforest PA Pager: [phone]

## 2018-07-14 NOTE — Progress Notes (Signed)
Progress Note  Patient Name: Charles Cameron Date of Encounter: 07/14/2018  Primary Cardiologist: Buford Dresser, MD PhD (new)  Subjective   Feeling better today. Had a rough afternoon yesterday. Had several hours of sustained aflutter/fib RVR, noted increased leg swelling and fatigue with this. Received 1 time dose of IV metoprolol and was started on lasix. Feeling much better today, but heart rate still elevated with minimal exertion. Spent significant time discussing all of the options for management.  Inpatient Medications    Scheduled Meds: . apixaban  5 mg Oral BID  . diltiazem  360 mg Oral Daily  . dronedarone  400 mg Oral BID WC  . furosemide  40 mg Intravenous BID  . mouth rinse  15 mL Mouth Rinse BID  . polyethylene glycol  17 g Oral Daily   Continuous Infusions: . cefTRIAXone (ROCEPHIN)  IV 1 g (07/13/18 1309)  . sodium chloride Stopped (07/10/18 1729)   PRN Meds: acetaminophen, guaiFENesin-dextromethorphan, levalbuterol, ondansetron (ZOFRAN) IV   Vital Signs    Vitals:   07/13/18 1505 07/13/18 1808 07/13/18 1940 07/14/18 0447  BP: (!) 143/82 (!) 148/67 120/78 (!) 142/100  Pulse: 67 (!) 102 62 75  Resp: 16  17 19   Temp: 98.4 F (36.9 C)  98.8 F (37.1 C) 98.2 F (36.8 C)  TempSrc: Oral     SpO2: 96% 98% 96% 93%  Weight:    104.5 kg  Height:        Intake/Output Summary (Last 24 hours) at 07/14/2018 4496 Last data filed at 07/14/2018 7591 Gross per 24 hour  Intake -  Output 3725 ml  Net -3725 ml   Filed Weights   07/11/18 2114 07/13/18 0558 07/14/18 0447  Weight: 109.5 kg 109 kg 104.5 kg    Telemetry    Brief time of sinus rhythm, but predominantly atrial flutter and atrial fibrillation Personally Reviewed  ECG    Atrial fibrillation with RBBB 07/13/18 - Personally Reviewed  Physical Exam   GEN: Sitting comfortably on the edge of the bed, in NAD Neck: No JVD Cardiac: irregularly irregular S1 and S2, no murmurs, rubs, or gallops.    Respiratory: clear bilaterally with faint crackles at bilateral bases GI: Soft, nontender, non-distended  MS: Trace-1+ bilateral LE edema; No deformity. Neuro:  Nonfocal  Psych: Normal affect   Labs    Chemistry Recent Labs  Lab 07/10/18 1421 07/11/18 0328 07/12/18 0824  NA 139 141 139  K 4.1 3.9 3.8  CL 103 106 102  CO2 29 26 26   GLUCOSE 124* 134* 116*  BUN 21 20 15   CREATININE 1.07 1.10 1.03  CALCIUM 8.4* 8.3* 8.5*  PROT 6.1*  --   --   ALBUMIN 3.3*  --   --   AST 65*  --   --   ALT 102*  --   --   ALKPHOS 153*  --   --   BILITOT 0.9  --   --   GFRNONAA >60 >60 >60  GFRAA >60 >60 >60  ANIONGAP 7 9 11      Hematology Recent Labs  Lab 07/10/18 1421 07/11/18 0328 07/12/18 0824  WBC 7.5 7.8 8.1  RBC 4.46 4.08* 4.34  HGB 14.1 13.3 14.1  HCT 43.5 40.2 42.5  MCV 97.5 98.5 97.9  MCH 31.6 32.6 32.5  MCHC 32.4 33.1 33.2  RDW 13.2 13.3 13.3  PLT 224 220 197    Cardiac Enzymes Recent Labs  Lab 07/10/18 1421 07/11/18 0328  TROPONINI 0.03* <  0.03   No results for input(s): TROPIPOC in the last 168 hours.   BNP Recent Labs  Lab 07/10/18 1421  BNP 317.2*     DDimer No results for input(s): DDIMER in the last 168 hours.   Radiology    Dg Chest 2 View  Result Date: 07/13/2018 CLINICAL DATA:  Acute onset of bilateral lower extremity swelling and shortness of breath. Atrial fibrillation. EXAM: CHEST - 2 VIEW COMPARISON:  Chest radiograph performed 07/10/2018 FINDINGS: The lungs are well-aerated. Vascular congestion is noted. Increased interstitial markings raise concern for mild interstitial edema. Small bilateral pleural effusions are noted. There is no evidence of pneumothorax. The heart is normal in size; the mediastinal contour is within normal limits. No acute osseous abnormalities are seen. IMPRESSION: Vascular congestion. Increased interstitial markings raise concern for mild interstitial edema. Small bilateral pleural effusions noted. Electronically Signed    By: Garald Balding M.D.   On: 07/13/2018 21:15    Cardiac Studies   Echo 12/30 - Left ventricle: The cavity size was normal. Wall thickness was   increased in a pattern of mild LVH. Systolic function was normal.   The estimated ejection fraction was in the range of 55% to 60%.   Wall motion was normal; there were no regional wall motion   abnormalities. There was no evidence of elevated ventricular   filling pressure by Doppler parameters. - Aortic valve: Trileaflet; moderately thickened, moderately   calcified leaflets. Valve mobility was restricted. There was mild   regurgitation. - Mitral valve: There was moderate regurgitation. - Right ventricle: The cavity size was mildly dilated. Wall   thickness was normal. - Right atrium: The atrium was mildly dilated.  Patient Profile     73 y.o. male with PMH of recent URI/bronchitis, history of prostate cancer who presented with near syncope. Found to be in new atrial flutter, initially 2:1 conduction, initially converted to NSR on drip, then back to aflutter. Has now been in/out of atrial flutter and atrial fibrillation.  Assessment & Plan    Atrial flutter: initially RVR, then NSR, now intermittent flutter/fib -continue apixaban for anticoagulation. Chadsvasc is only 1, but will continue given new onset, possible need for CV in the future -echo with normal LV EF, mild AR, moderate MR.  -had been doing well with long acting diltiazem, but yesterday had rate spike without any major exertion. Dr. Sallyanne Kuster started dronedarone. He also reported LE edema yesterday afternoon, unclear if this was 2/2 single dose of dronedarone or sustained tachycardia. Much improved today. -we spent significant time discussing all options. I am going to keep the diltiazem at max and add metoprolol. I am wary of the dronedarone given the edema yesterday, and that would be the first medication I would discontinue if we get adequate rate control -given dronedarone,  avoid QTc prolonging agents. Completed azithromycin. Stopped zofran (not requiring PRN).  -checking CMP and TSH today.  URI: has been prolonged, concern for atypical pneumonia on initial CXR. CXR yesterday with resolution -flu negative -received azithromycin and ceftriaxone in ER. Completing 5 day course today. -wheezing improved with albuterol, guaifenisin/DM syrup -now off O2, lungs much improved on exam  Edema: may be due to tachycardia. Has trace LE edema today, but reports it was much worse yesterday afternoon. Started on IV lasix yesterday with brisk output, -3.5 L yesterday. Admission weight 107, now 104.5. Net negative 4.6 L since admission. Will change to oral dosing.  I am going to see how he does with the metoprolol  dosing. If he can ambulate and keep his HR below 110 with ambulation and below 90 at rest, he may be able to go home with close follow up. I will see him back in clinic on 1/6 at [facility].  TIME SPENT WITH PATIENT: >35 minutes of direct patient care. More than 50% of that time was spent on coordination of care and counseling regarding medication management, goals, planning for discharge.  Buford Dresser, MD, PhD [facility]   For questions or updates, please contact [place] Please consult [email or web address] for contact info under     Signed, Buford Dresser, MD  07/14/2018, 9:22 AM

## 2018-07-15 LAB — CULTURE, BLOOD (ROUTINE X 2)
CULTURE: NO GROWTH
CULTURE: NO GROWTH
SPECIAL REQUESTS: ADEQUATE
Special Requests: ADEQUATE

## 2018-07-18 ENCOUNTER — Encounter: Payer: Self-pay | Admitting: Cardiology

## 2018-07-18 ENCOUNTER — Ambulatory Visit (INDEPENDENT_AMBULATORY_CARE_PROVIDER_SITE_OTHER): Payer: Medicare Other | Admitting: Cardiology

## 2018-07-18 VITALS — BP 130/76 | HR 73 | Ht 70.0 in | Wt 226.8 lb

## 2018-07-18 DIAGNOSIS — Z7901 Long term (current) use of anticoagulants: Secondary | ICD-10-CM

## 2018-07-18 DIAGNOSIS — I4892 Unspecified atrial flutter: Secondary | ICD-10-CM | POA: Diagnosis not present

## 2018-07-18 DIAGNOSIS — R600 Localized edema: Secondary | ICD-10-CM

## 2018-07-18 DIAGNOSIS — Z79899 Other long term (current) drug therapy: Secondary | ICD-10-CM | POA: Diagnosis not present

## 2018-07-18 DIAGNOSIS — J069 Acute upper respiratory infection, unspecified: Secondary | ICD-10-CM

## 2018-07-18 MED ORDER — FUROSEMIDE 40 MG PO TABS: 40.0000 mg | ORAL_TABLET | ORAL | 6 refills | Status: DC | PRN

## 2018-07-18 MED ORDER — METOPROLOL TARTRATE 25 MG PO TABS: 25.0000 mg | ORAL_TABLET | ORAL | 6 refills | Status: DC | PRN

## 2018-07-18 NOTE — Progress Notes (Signed)
Cardiology Office Note:    Date:  07/18/2018   ID:  Charles Cameron, DOB [birth year], MRN [id number]  PCP:  Rodena Medin, MD (Inactive)  Cardiologist:  Buford Dresser, MD PhD  Referring MD: No ref. provider found   CC: Post hospital follow up  History of Present Illness:    Charles Cameron is a 73 y.o. male with a hx of recent pneumonia, new atrial flutter with intermittent atrial fibrillation who is seen for post hospital follow up. He was discharged on 07/14/18.  Since discharge, he has been feeling very fatigued. He is sleeping up in a chair because lying down makes him cough. He sleeps in, is awake for lunchtime, and then takes a nap in the afternoon. This is abnormal for him. His breathing is much improved from his hospitalization but still not back to baseline. No fevers. Cough is nonproductive. He notes that he tends to get bronchitis or other URI about every three months for many years, and he had bronchitis as a child. Has not been seen by pulmonary.  He has a blood pressure cuff at home, and blood pressures have been normal. Heart rates have been around 70, even with some activity. Continues to be irregular by the cuff, but he does not have palpitations.   We discussed options for management extensively, see plan below. No bleeding on apixaban.  Past Medical History:  Diagnosis Date  . Anticoagulation adequate 07/14/2018  . Edema 07/14/2018  . URI (upper respiratory infection) 07/14/2018    Past Surgical History:  Procedure Laterality Date  . LUMBAR LAMINECTOMY  1989    Current Medications: Current Outpatient Medications on File Prior to Visit  Medication Sig  . acetaminophen (TYLENOL) 325 MG tablet Take 2 tablets (650 mg total) by mouth every 4 (four) hours as needed for headache or mild pain.  Marland Kitchen albuterol (PROVENTIL HFA;VENTOLIN HFA) 108 (90 Base) MCG/ACT inhaler Inhale 2 puffs into the lungs every 4 (four) hours as needed for wheezing.  Marland Kitchen apixaban (ELIQUIS) 5 MG  TABS tablet Take 1 tablet (5 mg total) by mouth 2 (two) times daily.  Marland Kitchen diltiazem (CARDIZEM CD) 360 MG 24 hr capsule Take 1 capsule (360 mg total) by mouth daily.  Marland Kitchen dronedarone (MULTAQ) 400 MG tablet Take 1 tablet (400 mg total) by mouth 2 (two) times daily with a meal.  . fluticasone (FLONASE) 50 MCG/ACT nasal spray Place 1 spray into both nostrils daily.  . furosemide (LASIX) 40 MG tablet Take 1 tablet (40 mg total) by mouth daily.  Marland Kitchen guaiFENesin-dextromethorphan (ROBITUSSIN DM) 100-10 MG/5ML syrup Take 5 mLs by mouth every 4 (four) hours as needed for cough.  . metoprolol tartrate (LOPRESSOR) 25 MG tablet Take 1 tablet (25 mg total) by mouth every 12 (twelve) hours.  . polyethylene glycol (MIRALAX / GLYCOLAX) packet Take 17 g by mouth daily.   No current facility-administered medications on file prior to visit.      Allergies:   Patient has no known allergies.   Social History   Socioeconomic History  . Marital status: Married    Spouse name: [name]  . Number of children: 5  . Years of education: 59  . Highest education level: Not on file  Occupational History  . Occupation: retired  Scientific laboratory technician  . Financial resource strain: Not on file  . Food insecurity:    Worry: Not on file    Inability: Not on file  . Transportation needs:    Medical: Not on file  Non-medical: Not on file  Tobacco Use  . Smoking status: Former Smoker    Packs/day: 2.00    Years: 27.00    Pack years: 54.00    Types: Cigarettes    Last attempt to quit: 07/14/1983    Years since quitting: 35.0  . Smokeless tobacco: Never Used  Substance and Sexual Activity  . Alcohol use: Yes    Comment: light use; rarely  . Drug use: No  . Sexual activity: Not on file  Lifestyle  . Physical activity:    Days per week: Not on file    Minutes per session: Not on file  . Stress: Not on file  Relationships  . Social connections:    Talks on phone: Not on file    Gets together: Not on file    Attends  religious service: Not on file    Active member of club or organization: Not on file    Attends meetings of clubs or organizations: Not on file    Relationship status: Not on file  Other Topics Concern  . Not on file  Social History Narrative   Patient lives at home with spouse.   Caffeine Use: 2 cups daily     Family History: The patient's family history includes Colon cancer in an other family member; Diabetes in his paternal grandfather; Heart attack in his mother; Hypertension in his father and mother; Prostate cancer in his father.  ROS:   Please see the history of present illness.  Additional pertinent ROS:  Constitutional: Negative for chills, fever, night sweats, unintentional weight loss  HENT: Negative for ear pain and hearing loss.   Eyes: Negative for loss of vision and eye pain.  Respiratory: Positive for cough. Negative for sputum, shortness of breath, wheezing.   Cardiovascular: Positive for mild LE edema. Negative for chest pain, palpitations, PND, orthopnea, and claudication.  Gastrointestinal: Negative for abdominal pain, melena, and hematochezia.  Genitourinary: Negative for dysuria and hematuria.  Musculoskeletal: Negative for falls and myalgias.  Skin: Negative for itching and rash.  Neurological: Negative for focal weakness, focal sensory changes and loss of consciousness.  Endo/Heme/Allergies: Does not bruise/bleed easily.    EKGs/Labs/Other Studies Reviewed:    The following studies were reviewed today: Echo 07/11/18 - Left ventricle: The cavity size was normal. Wall thickness was   increased in a pattern of mild LVH. Systolic function was normal.   The estimated ejection fraction was in the range of 55% to 60%.   Wall motion was normal; there were no regional wall motion   abnormalities. There was no evidence of elevated ventricular   filling pressure by Doppler parameters. - Aortic valve: Trileaflet; moderately thickened, moderately   calcified  leaflets. Valve mobility was restricted. There was mild   regurgitation. - Mitral valve: There was moderate regurgitation. - Right ventricle: The cavity size was mildly dilated. Wall   thickness was normal. - Right atrium: The atrium was mildly dilated.  EKG:  EKG is personally reviewed.  The ekg ordered today demonstrates atypical atrial flutter with variable block  Recent Labs: 07/10/2018: B Natriuretic Peptide 317.2 07/12/2018: Hemoglobin 14.1; Magnesium 2.1; Platelets 197 07/14/2018: ALT 49; BUN 13; Creatinine, Ser 1.17; Potassium 3.6; Sodium 139; TSH 1.914  Recent Lipid Panel No results found for: CHOL, TRIG, HDL, CHOLHDL, VLDL, LDLCALC, LDLDIRECT  Physical Exam:    VS:  BP 130/76   Pulse 73   Ht 5\' 10"  (1.778 m)   Wt 226 lb 12.8 oz (102.9 kg)  BMI 32.54 kg/m     Wt Readings from Last 3 Encounters:  07/18/18 226 lb 12.8 oz (102.9 kg)  07/14/18 230 lb 4.8 oz (104.5 kg)  10/03/13 227 lb (103 kg)     GEN: Well nourished, well developed in no acute distress HEENT: Normal NECK: No JVD; No carotid bruits LYMPHATICS: No lymphadenopathy CARDIAC: irregular rhythm, normal S1 and S2, no murmurs, rubs, gallops. Radial and DP pulses 2+ bilaterally. RESPIRATORY:  Clear to auscultation without wheezing or rhonchi in upper fields, but bilateral fine crackles at bases ABDOMEN: Soft, non-tender, non-distended MUSCULOSKELETAL:  Trace bilateral LE edema; No deformity  SKIN: Warm and dry NEUROLOGIC:  Alert and oriented x 3 PSYCHIATRIC:  Normal affect   ASSESSMENT:    1. Atrial flutter, unspecified type ([place])   2. Medication management   3. Bilateral leg edema   4. Upper respiratory tract infection, unspecified type   5. Anticoagulation adequate    PLAN:    Atrial flutter, initially typical, now appears atypical, also with atrial fibrillation during hospitalization:  -this was new onset for him this hospitalization. I suspect it was driven by his URI, which appears to be  resolving. The tachycardia is also likely the source of his bilateral pleural effusions and LE edema, which appear to be resolving. He has a normal ejection fraction.  -we initially tried rate control with diltiazem, which worked for a time but then no longer controlled rate. He was seen by Dr. Sallyanne Kuster on [facility], who started dronedarone. He briefly had sinus rhythm, but this did not sustain. Given inability to stay in sinus, TEE/CV not pursued. He continued to have tachycardia, so metoprolol was added.  -given that he is now much better rate controlled, and also having fatigue, I would change the metoprolol to PRN for heart rates >105. If he needs to take a PRN dose frequently, the other option would be to decrease the metoprolol dose to 12.5 mg BID  -I would continue the dronedarone for one month total, then stop. This should give him coverage during the acute recovery phase of his URI. If he remains with fib/flutter after, would prefer dofetilide in the long term. Dronedarone is also very expensive for them.  -continue diltiazem.  -change to weight based lasix dosing. He does still have small effusions and edema, but he is back to his baseline weight and symptomatically improving. Check BMET today given use of diuretics  -continue apixaban for now. CHA2DS2/VAS Stroke Risk Points=  1, but if he does not revert to sinus rhythm, and if he remains symptomatic, I would set him up for cardioversion. He would need anticoagulation for 4 weeks post CV at a minimum. Apixaban was also expensive for him. Rivaroxaban would be an acceptable alternative if it is less expensive.  -given his history of chronic bronchitis, I recommended that he talk to his primary care doctor about PFTs (once more recovered from URI) and pulmonology referral.    Plan for follow up: 1 mos, though he will call sooner if needed. If still symptomatic at home in 2 weeks, would arrange for cardioversion.  TIME SPENT WITH  PATIENT: 40 minutes of direct patient care. More than 50% of that time was spent on coordination of care and counseling regarding management of atrial arrhythmia, options for next steps.  Buford Dresser, MD, PhD [place]  [facility]   Medication Adjustments/Labs and Tests Ordered: Current medicines are reviewed at length with the patient today.  Concerns regarding medicines are outlined  above.  Orders Placed This Encounter  Procedures  . Basic metabolic panel  . EKG 12-Lead   Meds ordered this encounter  Medications  . metoprolol tartrate (LOPRESSOR) 25 MG tablet    Sig: Take 1 tablet (25 mg total) by mouth as needed (Heart Rate greater than 105).    Dispense:  60 tablet    Refill:  6  . furosemide (LASIX) 40 MG tablet    Sig: Take 1 tablet (40 mg total) by mouth as needed (Weight gain of 3 lbs in 1 day or 5 lbs in one week).    Dispense:  30 tablet    Refill:  6    Patient Instructions  Medication Instructions:  Take- Metoprolol 25 mg as needed for heart rate greater than 105           Lasix 40 mg as needed for a weight gain of 3 lbs in 1 day or 5 lbs in 1 week   Stop: Dronedarone 400 mg once completed  If you need a refill on your cardiac medications before your next appointment, please call your pharmacy.   Lab work: Your physician recommends that you return for lab work today (BMP)  If you have labs (blood work) drawn today and your tests are completely normal, you will receive your results only by: Marland Kitchen MyChart Message (if you have MyChart) OR . A paper copy in the mail If you have any lab test that is abnormal or we need to change your treatment, we will call you to review the results.  Testing/Procedures: None  Follow-Up: Your physician recommends that you schedule a follow-up appointment in 1 month with Dr. Harrell Gave.      Signed, Buford Dresser, MD PhD 07/18/2018 7:50 AM    [place]

## 2018-07-18 NOTE — Progress Notes (Signed)
Transitions of Care Follow Up Call Note  Charles Cameron is an 73 y.o. male who presented to [facility] on 07/10/2018.  The patient had the following prescriptions filled at [place]: cartia, furosemide, eliquis, metoprolol  Patient was called by pharmacist and HIPAA identifiers were verified. The following questions were asked about the prescriptions filled at [place]:  Has the patient been experiencing any side effects to the medications prescribed?  Yes, reports low energy. Counseled pt that this is common with starting beta blocker therapy  Understanding of regimen: good Understanding of indications: fair Potential of compliance: excellent  Pharmacist comments: Pt states was taking diltiazem 180mg  capsules one capsule PO BID. Educated pt to take 2 capsules (360mg ) once daily. Other than low energy, no other side effects were reported.   [x]  Patient's prescriptions filled at the [facility] Transitions of Care Pharmacy were transferred to the following pharmacy: [facility] 07/18/2018, 5:17 PM Transitions of Care Pharmacy Hours: Monday - Friday 8:30am to 5:00 PM  Phone - [phone]

## 2018-07-18 NOTE — Patient Instructions (Addendum)
Medication Instructions:  Take- Metoprolol 25 mg as needed for heart rate greater than 105           Lasix 40 mg as needed for a weight gain of 3 lbs in 1 day or 5 lbs in 1 week   Stop: Dronedarone 400 mg once completed  If you need a refill on your cardiac medications before your next appointment, please call your pharmacy.   Lab work: Your physician recommends that you return for lab work today (BMP)  If you have labs (blood work) drawn today and your tests are completely normal, you will receive your results only by: Marland Kitchen MyChart Message (if you have MyChart) OR . A paper copy in the mail If you have any lab test that is abnormal or we need to change your treatment, we will call you to review the results.  Testing/Procedures: None  Follow-Up: Your physician recommends that you schedule a follow-up appointment in 1 month with Dr. Harrell Gave.

## 2018-07-19 LAB — BASIC METABOLIC PANEL
BUN/Creatinine Ratio: 15 (ref 10–24)
BUN: 15 mg/dL (ref 8–27)
CO2: 21 mmol/L (ref 20–29)
Calcium: 9.3 mg/dL (ref 8.6–10.2)
Chloride: 105 mmol/L (ref 96–106)
Creatinine, Ser: 1 mg/dL (ref 0.76–1.27)
GFR calc Af Amer: 85 mL/min/{1.73_m2} (ref >59)
GFR calc non Af Amer: 74 mL/min/{1.73_m2} (ref >59)
GLUCOSE: 102 mg/dL — AB (ref 65–99)
POTASSIUM: 4.1 mmol/L (ref 3.5–5.2)
Sodium: 145 mmol/L — ABNORMAL HIGH (ref 134–144)

## 2018-08-11 ENCOUNTER — Telehealth: Payer: Self-pay | Admitting: *Deleted

## 2018-08-11 NOTE — Telephone Encounter (Signed)
Patient calling to see if Dr. Harrell Gave wants him to still take Diltiazem before he goes and picks up new prescription.  Please advise.

## 2018-08-12 NOTE — Telephone Encounter (Signed)
He can stop the dronedarone, but I would continue the diltiazem for now. If he has heart rates consistently in the 50s or less, he should call and let us know. Thanks!

## 2018-08-12 NOTE — Addendum Note (Signed)
Addended by: Meryl Crutch on: 08/12/2018 09:16 AM   Modules accepted: Orders

## 2018-08-12 NOTE — Telephone Encounter (Signed)
Pt updated with Dr. Judeth Cornfield recommendation and verbalized understanding.

## 2018-08-15 ENCOUNTER — Ambulatory Visit (INDEPENDENT_AMBULATORY_CARE_PROVIDER_SITE_OTHER): Payer: Medicare Other | Admitting: Cardiology

## 2018-08-15 ENCOUNTER — Encounter: Payer: Self-pay | Admitting: Cardiology

## 2018-08-15 VITALS — BP 128/74 | HR 56 | Ht 70.0 in | Wt 227.0 lb

## 2018-08-15 DIAGNOSIS — I4892 Unspecified atrial flutter: Secondary | ICD-10-CM | POA: Diagnosis not present

## 2018-08-15 DIAGNOSIS — Z7901 Long term (current) use of anticoagulants: Secondary | ICD-10-CM | POA: Insufficient documentation

## 2018-08-15 DIAGNOSIS — I480 Paroxysmal atrial fibrillation: Secondary | ICD-10-CM | POA: Diagnosis not present

## 2018-08-15 NOTE — Patient Instructions (Signed)

## 2018-08-15 NOTE — Progress Notes (Signed)
Cardiology Office Note:    Date:  08/15/2018   ID:  Charles Cameron, DOB [birth year], MRN [id number]  PCP:  Lauraine Rinne, MD  Cardiologist:  Buford Dresser, MD PhD  Referring MD: No ref. provider found   CC: follow up  History of Present Illness:    Charles Cameron is a 73 y.o. male with a hx of recent pneumonia, new atrial flutter with paroxysmal atrial fibrillation with intermittent atrial fibrillation who is seen for follow up. He was discharged on 07/14/18.  Cardiac history: No prior history until 06/2018, when he developed URI and atrial flutter/atrial fibrillation. He had intermittent RVR while in the hospital, requiring multiple medications for management. He was initially on dronedarone (stopped after 30 days), diltiazem, and metoprolol. Had volume overload with arrhythmia, improved with lasix. EF was normal.  Today: doing well overall. Had allergy testing, allergic to grasses and some others. Breathing much improved since hospitalization, no residual issues. Finished dronedarone on 08/12/18. Tolerating diltiazem, most heart rates in the 50s/60s, hasn't seen anything higher than 80s at rest. Has been in and out of rhythm per his blood pressure cuff. Check a few times/day, thinks he is in NSR about 50% of the time. He cannot feel when he is in sinus rhythm and when he is not, but his cuff tells him if he has irregular beats. Able to be active, though he notes that he gets bilateral hip and gluteal pain after walking about a mile. No lower extremity claudication symptoms. Tolerating apixaban. Both apixaban and diltiazem are reasonable priced for him. Has not needed PRN metoprolol or furosemide since just after his hospitalization.  Past Medical History:  Diagnosis Date  . Anticoagulation adequate 07/14/2018  . Edema 07/14/2018  . URI (upper respiratory infection) 07/14/2018    Past Surgical History:  Procedure Laterality Date  . LUMBAR LAMINECTOMY  1989    Current  Medications: Current Outpatient Medications on File Prior to Visit  Medication Sig  . acetaminophen (TYLENOL) 325 MG tablet Take 2 tablets (650 mg total) by mouth every 4 (four) hours as needed for headache or mild pain.  Marland Kitchen apixaban (ELIQUIS) 5 MG TABS tablet Take 1 tablet (5 mg total) by mouth 2 (two) times daily.  Marland Kitchen diltiazem (CARDIZEM CD) 360 MG 24 hr capsule Take 1 capsule (360 mg total) by mouth daily.  . fluticasone (FLONASE) 50 MCG/ACT nasal spray Place 1 spray into both nostrils daily.  . furosemide (LASIX) 40 MG tablet Take 1 tablet (40 mg total) by mouth as needed (Weight gain of 3 lbs in 1 day or 5 lbs in one week).  Marland Kitchen guaiFENesin-dextromethorphan (ROBITUSSIN DM) 100-10 MG/5ML syrup Take 5 mLs by mouth every 4 (four) hours as needed for cough.  . levocetirizine (XYZAL) 5 MG tablet Take 5 mg by mouth every evening.  . metoprolol tartrate (LOPRESSOR) 25 MG tablet Take 1 tablet (25 mg total) by mouth as needed (Heart Rate greater than 105).   No current facility-administered medications on file prior to visit.      Allergies:   Patient has no known allergies.   Social History   Socioeconomic History  . Marital status: Married    Spouse name: Charles Cameron  . Number of children: 5  . Years of education: 43  . Highest education level: Not on file  Occupational History  . Occupation: retired  Scientific laboratory technician  . Financial resource strain: Not on file  . Food insecurity:    Worry: Not on file  Inability: Not on file  . Transportation needs:    Medical: Not on file    Non-medical: Not on file  Tobacco Use  . Smoking status: Former Smoker    Packs/day: 2.00    Years: 27.00    Pack years: 54.00    Types: Cigarettes    Last attempt to quit: 07/14/1983    Years since quitting: 35.1  . Smokeless tobacco: Never Used  Substance and Sexual Activity  . Alcohol use: Yes    Comment: light use; rarely  . Drug use: No  . Sexual activity: Not on file  Lifestyle  . Physical activity:     Days per week: Not on file    Minutes per session: Not on file  . Stress: Not on file  Relationships  . Social connections:    Talks on phone: Not on file    Gets together: Not on file    Attends religious service: Not on file    Active member of club or organization: Not on file    Attends meetings of clubs or organizations: Not on file    Relationship status: Not on file  Other Topics Concern  . Not on file  Social History Narrative   Patient lives at home with spouse.   Caffeine Use: 2 cups daily     Family History: The patient's family history includes Colon cancer in an other family member; Diabetes in his paternal grandfather; Heart attack in his mother; Hypertension in his father and mother; Prostate cancer in his father.  ROS:   Please see the history of present illness.  Additional pertinent ROS:  Constitutional: Negative for chills, fever, night sweats, unintentional weight loss  HENT: Negative for ear pain and hearing loss.   Eyes: Negative for loss of vision and eye pain.  Respiratory: Negative for cough, sputum, shortness of breath, wheezing.   Cardiovascular: Negative for chest pain, palpitations, PND, orthopnea, and LE edema.  Gastrointestinal: Negative for abdominal pain, melena, and hematochezia.  Genitourinary: Negative for dysuria and hematuria.  Musculoskeletal: Negative for falls and myalgias.  Skin: Negative for itching and rash.  Neurological: Negative for focal weakness, focal sensory changes and loss of consciousness.  Endo/Heme/Allergies: Does not bruise/bleed easily.    EKGs/Labs/Other Studies Reviewed:    The following studies were reviewed today: Echo 07/11/18 - Left ventricle: The cavity size was normal. Wall thickness was   increased in a pattern of mild LVH. Systolic function was normal.   The estimated ejection fraction was in the range of 55% to 60%.   Wall motion was normal; there were no regional wall motion   abnormalities. There was  no evidence of elevated ventricular   filling pressure by Doppler parameters. - Aortic valve: Trileaflet; moderately thickened, moderately   calcified leaflets. Valve mobility was restricted. There was mild   regurgitation. - Mitral valve: There was moderate regurgitation. - Right ventricle: The cavity size was mildly dilated. Wall   thickness was normal. - Right atrium: The atrium was mildly dilated.  EKG:  EKG is personally reviewed.  The ekg ordered today demonstrates sinus bradycardia at 56 bpm with RBBB  Recent Labs: 07/10/2018: B Natriuretic Peptide 317.2 07/12/2018: Hemoglobin 14.1; Magnesium 2.1; Platelets 197 07/14/2018: ALT 49; TSH 1.914 07/18/2018: BUN 15; Creatinine, Ser 1.00; Potassium 4.1; Sodium 145  Recent Lipid Panel No results found for: CHOL, TRIG, HDL, CHOLHDL, VLDL, LDLCALC, LDLDIRECT  Physical Exam:    VS:  BP 128/74   Pulse (!) 56  Ht 5\' 10"  (1.778 m)   Wt 227 lb 0.6 oz (103 kg)   BMI 32.58 kg/m     Wt Readings from Last 3 Encounters:  08/15/18 227 lb 0.6 oz (103 kg)  07/18/18 226 lb 12.8 oz (102.9 kg)  07/14/18 230 lb 4.8 oz (104.5 kg)     GEN: Well nourished, well developed in no acute distress HEENT: Normal NECK: No JVD; No carotid bruits LYMPHATICS: No lymphadenopathy CARDIAC: regular rhythm, normal S1 and S2, no murmurs, rubs, gallops. Radial and DP pulses 2+ bilaterally. RESPIRATORY:  Clear to auscultation without wheezing or rhonchi, resolved crackles at bases ABDOMEN: Soft, non-tender, non-distended MUSCULOSKELETAL:  No LE edema; No deformity  SKIN: Warm and dry NEUROLOGIC:  Alert and oriented x 3 PSYCHIATRIC:  Normal affect   ASSESSMENT:    1. Atrial flutter, unspecified type ([place])   2. Paroxysmal atrial fibrillation ([place])   3. Long term (current) use of anticoagulants    PLAN:    Paroxysmal Atrial flutter (initially typical, then atypical), also with paroxysmal atrial fibrillation during hospitalization: -new onset in the setting  of URI. Normal echo. Did 30 days of dronedarone, now in sinus bradycardia today. Reports intermittent arrhythmia based on his blood pressure cuff, but he is asymptomatic.  -euvolemic today -has not required PRN metoprolol or furosemide since shortly after discharge. -will continue diltiazem CR for rate control. If he notes worsening fatigue with exertion, will consider cutting back the dose to allow for more heart rate augmentation -continue apixaban. CHA2DS2/VAS Stroke Risk Points=1 for age only, but he will be at CV=2 when he turns 49. Continued anticoagulation will allow [place] to cardiovert if he returns to afib/flutter with RVR. Tolerating well.   Being seen by pulmonary for history of chronic bronchitis, note reviewed.   Plan for follow up: 6 mos or sooner PRN  TIME SPENT WITH PATIENT: 20 minutes of direct patient care. More than 50% of that time was spent on coordination of care and counseling regarding management of atrial arrhythmia, options for next steps.  Buford Dresser, MD, PhD [place]  [facility]   Medication Adjustments/Labs and Tests Ordered: Current medicines are reviewed at length with the patient today.  Concerns regarding medicines are outlined above.  Orders Placed This Encounter  Procedures  . EKG 12-Lead   No orders of the defined types were placed in this encounter.   Patient Instructions  Medication Instructions:  Your Physician recommend you continue on your current medication as directed.    If you need a refill on your cardiac medications before your next appointment, please call your pharmacy.   Lab work: None  Testing/Procedures: None  Follow-Up: At [facility], you and your health needs are our priority.  As part of our continuing mission to provide you with exceptional heart care, we have created designated Provider Care Teams.  These Care Teams include your primary Cardiologist (physician) and Advanced Practice Providers (APPs -   Physician Assistants and Nurse Practitioners) who all work together to provide you with the care you need, when you need it. You will need a follow up appointment in 6 months.  Please call our office 2 months in advance to schedule this appointment.  You may see Buford Dresser, MD or one of the following Advanced Practice Providers on your designated Care Team:   Rosaria Ferries, PA-C . Jory Sims, DNP, ANP        Signed, Buford Dresser, MD PhD 08/15/2018 11:30 AM    [facility]

## 2018-09-06 ENCOUNTER — Encounter: Payer: Self-pay | Admitting: Pharmacist Clinician (PhC)/ Clinical Pharmacy Specialist

## 2018-09-06 ENCOUNTER — Ambulatory Visit (INDEPENDENT_AMBULATORY_CARE_PROVIDER_SITE_OTHER): Payer: Medicare Other | Admitting: Pharmacist Clinician (PhC)/ Clinical Pharmacy Specialist

## 2018-09-06 DIAGNOSIS — I10 Essential (primary) hypertension: Secondary | ICD-10-CM | POA: Diagnosis not present

## 2018-09-06 MED ORDER — CHLORTHALIDONE 25 MG PO TABS: 25.0000 mg | ORAL_TABLET | Freq: Every day | ORAL | 3 refills | Status: DC

## 2018-09-06 NOTE — Assessment & Plan Note (Signed)
Patient with essential hypertension currently on maximum dose diltiazem for AF.  Will add on chlorthalidone 12.5 mg once daily and have him get BMET drawn in 10-14 days.  He is to continue with daily home BP checks, but never more than twice daily.  We will see him back in clinic in 4 weeks for follow up.

## 2018-09-06 NOTE — Patient Instructions (Signed)
Return for a a follow up appointment on March 26  Go to the lab in 10-14 days (week of March 9th)  Your blood pressure today is 152/84  (goal is < 130.80)  Check your blood pressure at home daily and keep record of the readings.  Take your BP meds as follows:  Start chlorthalidone 12.5 mg (1/2 of 25 mg tablet)  Bring all of your meds, your BP cuff and your record of home blood pressures to your next appointment.  Exercise as you're able, try to walk approximately 30 minutes per day.  Keep salt intake to a minimum, especially watch canned and prepared boxed foods.  Eat more fresh fruits and vegetables and fewer canned items.  Avoid eating in fast food restaurants.    HOW TO TAKE YOUR BLOOD PRESSURE: . Rest 5 minutes before taking your blood pressure. .  Don't smoke or drink caffeinated beverages for at least 30 minutes before. . Take your blood pressure before (not after) you eat. . Sit comfortably with your back supported and both feet on the floor (don't cross your legs). . Elevate your arm to heart level on a table or a desk. . Use the proper sized cuff. It should fit smoothly and snugly around your bare upper arm. There should be enough room to slip a fingertip under the cuff. The bottom edge of the cuff should be 1 inch above the crease of the elbow. . Ideally, take 3 measurements at one sitting and record the average.

## 2018-09-06 NOTE — Progress Notes (Signed)
09/06/2018 Charles Cameron 09-28-1945 [id number]   HPI:  Charles Cameron is a 73 y.o. male patient of Dr Harrell Gave, with a [place] below who presents today for hypertension clinic evaluation.  His medical history is significant only for paroxysmal AF, seasonal allergies and prior prostate cancer treated with prostatectomy and radiation  Patient reports he has always had good blood pressure, but that about 2-3 weeks ago noted it was starting to trend upward.  Has gone as high as 179/104 and lower readings are 366-440'H systolic.  He sent a MyChart note asking about this, and because he is not on any specific blood pressure medications, he was asked to come in.    Blood Pressure Goal:  130/80  Current Medications:  Diltiazem 360 mg qd  Metoprolol tart 25 mg prn HR >105  - used x 1 since seeing [facility], worked well  Family Hx:  Father died at 48, hypertension, prostate cancer, AAA, brain aneurysm  Mother died at 3, vaginal cancer, hypertension  1 sister died 14 years ago- ovarian cancer'   1 brother with hypertnsion, AAA, prostate cancer  6 children - oldest son with hypertension, cholesterol   Social Hx:  Prior smoker, quit 35 years ago; social drinker 2-3 times per week; rarely more than 2-3 per event; coffee each am, no other caffeine  Diet:  Mostly home cooked; does add salt with cooking and occasional at table; plenty of veggies no canned, mostly fresh; primarily eats fish/chicken, nothing deep fried, rare beef; typically cheese and crackers for afternoon snacks  Exercise:  Retired Chief Strategy Officer, still working with volunteer group to build houses (similar to [employer])  Home BP readings:  Has checked several times (up to tid) since he noticed the elevation.  Highest 179/104.   Intolerances:   NKDA   Labs:  07/18/2018:  Na 145, K 4.1, Glu 102, BUN 15, SCr 1.0 FGR 74  Wt Readings from Last 3 Encounters:  08/15/18 227 lb 0.6 oz (103 kg)  07/18/18 226 lb 12.8 oz (102.9 kg)    07/14/18 230 lb 4.8 oz (104.5 kg)   BP Readings from Last 3 Encounters:  09/06/18 (!) 152/84  08/15/18 128/74  07/18/18 130/76   Pulse Readings from Last 3 Encounters:  08/15/18 (!) 56  07/18/18 73  07/14/18 (!) 56    Current Outpatient Medications  Medication Sig Dispense Refill  . apixaban (ELIQUIS) 5 MG TABS tablet Take 1 tablet (5 mg total) by mouth 2 (two) times daily. 60 tablet 6  . diltiazem (CARDIZEM CD) 360 MG 24 hr capsule Take 1 capsule (360 mg total) by mouth daily. 30 capsule 6  . furosemide (LASIX) 40 MG tablet Take 1 tablet (40 mg total) by mouth as needed (Weight gain of 3 lbs in 1 day or 5 lbs in one week). 30 tablet 6  . levocetirizine (XYZAL) 5 MG tablet Take 5 mg by mouth every evening.    . montelukast (SINGULAIR) 10 MG tablet Take 10 mg by mouth at bedtime.    . chlorthalidone (HYGROTON) 25 MG tablet Take 1 tablet (25 mg total) by mouth daily. 15 tablet 3  . metoprolol tartrate (LOPRESSOR) 25 MG tablet Take 1 tablet (25 mg total) by mouth as needed (Heart Rate greater than 105). 60 tablet 6   No current facility-administered medications for this visit.     No Known Allergies  Past Medical History:  Diagnosis Date  . Anticoagulation adequate 07/14/2018  . Edema 07/14/2018  . URI (  upper respiratory infection) 07/14/2018    Blood pressure (!) 152/84.  Essential hypertension Patient with essential hypertension currently on maximum dose diltiazem for AF.  Will add on chlorthalidone 12.5 mg once daily and have him get BMET drawn in 10-14 days.  He is to continue with daily home BP checks, but never more than twice daily.  We will see him back in clinic in 4 weeks for follow up.     Tommy Medal PharmD CPP [facility] [address] [place], [place] [address] [phone]

## 2018-09-09 NOTE — Telephone Encounter (Signed)
Can you call and have him come in to see me or Charles Cameron soon? His eliquis is very expensive. If we have samples to get him through the next week or two until we can talk, that would be great. Thanks.

## 2018-09-09 NOTE — Telephone Encounter (Signed)
Called pt and updated him with Dr. Judeth Cornfield recommendation. Appointment scheduled for 3/18 at 3:20 pm with MD and samples left at the front desk for pt to pick up/  Eliquis 5 mg Qty: 2 boxes Lot # [id number] Exp: 6/22

## 2018-09-28 ENCOUNTER — Ambulatory Visit: Payer: Medicare Other | Admitting: Cardiology

## 2018-10-03 ENCOUNTER — Other Ambulatory Visit: Payer: Self-pay | Admitting: Pharmacist Clinician (PhC)/ Clinical Pharmacy Specialist

## 2018-10-03 MED ORDER — CHLORTHALIDONE 25 MG PO TABS: 12.5000 mg | ORAL_TABLET | Freq: Every day | ORAL | 3 refills | Status: DC

## 2018-10-06 ENCOUNTER — Ambulatory Visit: Payer: Medicare Other

## 2018-10-24 ENCOUNTER — Encounter: Payer: Self-pay | Admitting: Pharmacist Clinician (PhC)/ Clinical Pharmacy Specialist

## 2018-10-25 NOTE — Telephone Encounter (Signed)
Patient sent list of BP readings thru MyChart.  Based on twice daily readings for 13 days, his BP was on average 138/90 in the mornings and 125/83 in the evenings.  Currently taking chlorthalidone 12.5 mg daily and diltiazem 360 mg daily.  No problems with current medications.    We have not been able to get a metabolic panel on him, due to COVID-19.    Asked patient to wait another 3-4 weeks then if restrictions are starting to ease, he can go into office for BMET (on order in Epic).  After we receive that we can touch base on his home blood pressure readings and determine if there is need for further medication adjustments.    Patient voiced understanding of this.

## 2018-10-30 ENCOUNTER — Emergency Department (HOSPITAL_BASED_OUTPATIENT_CLINIC_OR_DEPARTMENT_OTHER): Payer: Medicare Other

## 2018-10-30 ENCOUNTER — Encounter (HOSPITAL_BASED_OUTPATIENT_CLINIC_OR_DEPARTMENT_OTHER): Payer: Self-pay | Admitting: Emergency Medicine

## 2018-10-30 ENCOUNTER — Other Ambulatory Visit: Payer: Self-pay

## 2018-10-30 ENCOUNTER — Emergency Department (HOSPITAL_BASED_OUTPATIENT_CLINIC_OR_DEPARTMENT_OTHER)
Admission: EM | Admit: 2018-10-30 | Discharge: 2018-10-30 | Disposition: A | Discharge status: Home / Self Care | Payer: Medicare Other | Attending: Emergency Medicine | Admitting: Emergency Medicine

## 2018-10-30 DIAGNOSIS — Z7901 Long term (current) use of anticoagulants: Secondary | ICD-10-CM | POA: Insufficient documentation

## 2018-10-30 DIAGNOSIS — R310 Gross hematuria: Secondary | ICD-10-CM

## 2018-10-30 DIAGNOSIS — R319 Hematuria, unspecified: Secondary | ICD-10-CM | POA: Diagnosis present

## 2018-10-30 DIAGNOSIS — N3001 Acute cystitis with hematuria: Secondary | ICD-10-CM | POA: Diagnosis not present

## 2018-10-30 DIAGNOSIS — Z79899 Other long term (current) drug therapy: Secondary | ICD-10-CM | POA: Diagnosis not present

## 2018-10-30 DIAGNOSIS — Z8546 Personal history of malignant neoplasm of prostate: Secondary | ICD-10-CM | POA: Insufficient documentation

## 2018-10-30 DIAGNOSIS — Z87891 Personal history of nicotine dependence: Secondary | ICD-10-CM | POA: Diagnosis not present

## 2018-10-30 HISTORY — DX: Malignant (primary) neoplasm, unspecified: C80.1

## 2018-10-30 HISTORY — DX: Essential (primary) hypertension: I10

## 2018-10-30 HISTORY — DX: Malignant neoplasm of prostate: C61

## 2018-10-30 LAB — CBC WITH DIFFERENTIAL/PLATELET
Abs Immature Granulocytes: 0.02 10*3/uL (ref 0.00–0.07)
Basophils Absolute: 0 10*3/uL (ref 0.0–0.1)
Basophils Relative: 1 %
Eosinophils Absolute: 0.2 10*3/uL (ref 0.0–0.5)
Eosinophils Relative: 2 %
HCT: 44.4 % (ref 39.0–52.0)
Hemoglobin: 14.8 g/dL (ref 13.0–17.0)
Immature Granulocytes: 0 %
Lymphocytes Relative: 24 %
Lymphs Abs: 1.9 10*3/uL (ref 0.7–4.0)
MCH: 31.7 pg (ref 26.0–34.0)
MCHC: 33.3 g/dL (ref 30.0–36.0)
MCV: 95.1 fL (ref 80.0–100.0)
Monocytes Absolute: 0.8 10*3/uL (ref 0.1–1.0)
Monocytes Relative: 11 %
Neutro Abs: 4.9 10*3/uL (ref 1.7–7.7)
Neutrophils Relative %: 62 %
Platelets: 196 10*3/uL (ref 150–400)
RBC: 4.67 MIL/uL (ref 4.22–5.81)
RDW: 13.2 % (ref 11.5–15.5)
WBC: 7.8 10*3/uL (ref 4.0–10.5)
nRBC: 0 % (ref 0.0–0.2)

## 2018-10-30 LAB — URINALYSIS, MICROSCOPIC (REFLEX)
RBC / HPF: >50 RBC/hpf (ref 0–5)
WBC, UA: >50 WBC/hpf (ref 0–5)

## 2018-10-30 LAB — BASIC METABOLIC PANEL
Anion gap: 6 (ref 5–15)
BUN: 22 mg/dL (ref 8–23)
CO2: 25 mmol/L (ref 22–32)
Calcium: 9 mg/dL (ref 8.9–10.3)
Chloride: 110 mmol/L (ref 98–111)
Creatinine, Ser: 0.98 mg/dL (ref 0.61–1.24)
GFR calc Af Amer: >60 mL/min (ref >60)
GFR calc non Af Amer: >60 mL/min (ref >60)
Glucose, Bld: 112 mg/dL — ABNORMAL HIGH (ref 70–99)
Potassium: 3.2 mmol/L — ABNORMAL LOW (ref 3.5–5.1)
Sodium: 141 mmol/L (ref 135–145)

## 2018-10-30 LAB — URINALYSIS, ROUTINE W REFLEX MICROSCOPIC

## 2018-10-30 MED ORDER — SODIUM CHLORIDE 0.9 % IV BOLUS
1000.0000 mL | Freq: Once | INTRAVENOUS | Status: AC
  Administered 2018-10-30: 1000 mL via INTRAVENOUS

## 2018-10-30 MED ORDER — SODIUM CHLORIDE 0.9 % IV SOLN
1.0000 g | Freq: Once | INTRAVENOUS | Status: AC
  Administered 2018-10-30: 1 g via INTRAVENOUS
  Filled 2018-10-30: qty 10

## 2018-10-30 MED ORDER — IOHEXOL 300 MG/ML  SOLN
100.0000 mL | Freq: Once | INTRAMUSCULAR | Status: AC | PRN
  Administered 2018-10-30: 100 mL via INTRAVENOUS

## 2018-10-30 MED ORDER — CEPHALEXIN 500 MG PO CAPS: 500.0000 mg | ORAL_CAPSULE | Freq: Three times a day (TID) | ORAL | 0 refills | Status: DC

## 2018-10-30 MED ORDER — LIDOCAINE HCL URETHRAL/MUCOSAL 2 % EX GEL
1.0000 "application " | Freq: Once | CUTANEOUS | Status: DC
  Filled 2018-10-30: qty 20

## 2018-10-30 NOTE — ED Triage Notes (Signed)
Reports hematuria that began 20 minutes PTA.

## 2018-10-30 NOTE — ED Notes (Signed)
EDP notified of insertion of catheter and irrigation started. MD went to bedside to evaluate.

## 2018-10-30 NOTE — Discharge Instructions (Signed)
Take keflex three times daily for a week.   Call your urologist office tomorrow for appointment   Urine culture was sent and you will be called if you have resistant organisms   Return to ER if you have worse blood in your urine, bladder pain, flank pain, vomiting

## 2018-10-30 NOTE — ED Notes (Signed)
ED Provider at bedside. 

## 2018-10-30 NOTE — ED Provider Notes (Signed)
[facility] Provider Note   CSN: [id number] Arrival date & time: 10/30/18  1612    History   Chief Complaint Chief Complaint  Patient presents with  . Hematuria    HPI Charles Cameron is a 73 y.o. male.     73 year old male on Eliquis for atrial flutter presents with complaint of frank hematuria, also PMH prostate cancer (treated with radical prostatectomy in 2016).  Patient was at home and garden store findings for yard project when he felt the discomfort in his bladder and went to the bathroom and voided blood.  Episode occurred 20 minutes prior to arrival in the ER.  Patient has given a urine sample on the in the ER and appears to be frank hematuria. Patient states he passed what appeared to be a blood clot in his urine 1-2 days ago, called his urologist but has not heard back. Denies feeling weak, dizzy, lightheaded. No other complaints or concerns.   Patient reports intermittent hematuria after his radical prostatectomy, states he followed up with his urologist who did a cystoscopy and stent there was a suture in the urethra that he tried to remove.  Has not had a problem with hematuria since that time.     Past Medical History:  Diagnosis Date  . Anticoagulation adequate 07/14/2018  . Edema 07/14/2018  . URI (upper respiratory infection) 07/14/2018    Patient Active Problem List   Diagnosis Date Noted  . Essential hypertension 09/06/2018  . Long term (current) use of anticoagulants 08/15/2018  . Paroxysmal atrial fibrillation ([place]) 08/15/2018  . Atrial flutter ([place]) 07/10/2018    Past Surgical History:  Procedure Laterality Date  . LUMBAR LAMINECTOMY  1989        Home Medications    Prior to Admission medications   Medication Sig Start Date End Date Taking? Authorizing Provider  apixaban (ELIQUIS) 5 MG TABS tablet Take 1 tablet (5 mg total) by mouth 2 (two) times daily. 07/14/18   Isaiah Serge, NP  chlorthalidone (HYGROTON) 25 MG  tablet Take 0.5 tablets (12.5 mg total) by mouth daily. 10/03/18   Buford Dresser, MD  diltiazem (CARDIZEM CD) 360 MG 24 hr capsule Take 1 capsule (360 mg total) by mouth daily. 07/15/18   Isaiah Serge, NP  furosemide (LASIX) 40 MG tablet Take 1 tablet (40 mg total) by mouth as needed (Weight gain of 3 lbs in 1 day or 5 lbs in one week). 07/18/18   Buford Dresser, MD  levocetirizine (XYZAL) 5 MG tablet Take 5 mg by mouth every evening.    [provider]  metoprolol tartrate (LOPRESSOR) 25 MG tablet Take 1 tablet (25 mg total) by mouth as needed (Heart Rate greater than 105). 07/18/18   Buford Dresser, MD  montelukast (SINGULAIR) 10 MG tablet Take 10 mg by mouth at bedtime.    [provider]    Family History Family History  Problem Relation Age of Onset  . Heart attack Mother   . Hypertension Mother   . Prostate cancer Father   . Hypertension Father   . Colon cancer Other   . Diabetes Paternal Grandfather     Social History Social History   Tobacco Use  . Smoking status: Former Smoker    Packs/day: 2.00    Years: 27.00    Pack years: 54.00    Types: Cigarettes    Last attempt to quit: 07/14/1983    Years since quitting: 35.3  . Smokeless tobacco: Never  Used  Substance Use Topics  . Alcohol use: Yes    Comment: light use; rarely  . Drug use: No     Allergies   Patient has no known allergies.   Review of Systems Review of Systems  Constitutional: Negative for fever.  Gastrointestinal: Negative for abdominal pain, constipation, diarrhea, nausea and vomiting.  Genitourinary: Positive for hematuria. Negative for decreased urine volume, difficulty urinating, dysuria, flank pain and frequency.  Musculoskeletal: Negative for arthralgias, back pain and myalgias.  Skin: Negative for rash and wound.  Allergic/Immunologic: Negative for immunocompromised state.  Neurological: Negative for dizziness, weakness and light-headedness.   Hematological: Bruises/bleeds easily.  All other systems reviewed and are negative.    Physical Exam Updated Vital Signs BP (!) 145/89 (BP Location: Left Arm)   Pulse 62   Temp 97.9 F (36.6 C) (Oral)   Resp 16   Ht 5\' 10"  (1.778 m)   Wt 102.1 kg   SpO2 96%   BMI 32.28 kg/m   Physical Exam Vitals signs and nursing note reviewed.  Constitutional:      General: He is not in acute distress.    Appearance: He is well-developed. He is not diaphoretic.  HENT:     Head: Normocephalic and atraumatic.  Cardiovascular:     Rate and Rhythm: Normal rate and regular rhythm.     Heart sounds: Normal heart sounds.  Pulmonary:     Effort: Pulmonary effort is normal.     Breath sounds: Normal breath sounds.  Abdominal:     Tenderness: There is no abdominal tenderness. There is no right CVA tenderness or left CVA tenderness.  Skin:    General: Skin is warm and dry.  Neurological:     Mental Status: He is alert and oriented to person, place, and time.  Psychiatric:        Behavior: Behavior normal.      ED Treatments / Results  Labs (all labs ordered are listed, but only abnormal results are displayed) Labs Reviewed  BASIC METABOLIC PANEL - Abnormal; Notable for the following components:      Result Value   Potassium 3.2 (*)    Glucose, Bld 112 (*)    All other components within normal limits  URINALYSIS, ROUTINE W REFLEX MICROSCOPIC - Abnormal; Notable for the following components:   Color, Urine RED (*)    APPearance TURBID (*)    Glucose, UA   (*)    Value: TEST NOT REPORTED DUE TO COLOR INTERFERENCE OF URINE PIGMENT   Hgb urine dipstick   (*)    Value: TEST NOT REPORTED DUE TO COLOR INTERFERENCE OF URINE PIGMENT   Bilirubin Urine   (*)    Value: TEST NOT REPORTED DUE TO COLOR INTERFERENCE OF URINE PIGMENT   Ketones, ur   (*)    Value: TEST NOT REPORTED DUE TO COLOR INTERFERENCE OF URINE PIGMENT   Protein, ur   (*)    Value: TEST NOT REPORTED DUE TO COLOR INTERFERENCE  OF URINE PIGMENT   Nitrite   (*)    Value: TEST NOT REPORTED DUE TO COLOR INTERFERENCE OF URINE PIGMENT   Leukocytes,Ua   (*)    Value: TEST NOT REPORTED DUE TO COLOR INTERFERENCE OF URINE PIGMENT   All other components within normal limits  URINALYSIS, MICROSCOPIC (REFLEX) - Abnormal; Notable for the following components:   Bacteria, UA MANY (*)    All other components within normal limits  CBC WITH DIFFERENTIAL/PLATELET    EKG None  Radiology No results found.  Procedures Procedures (including critical care time)  Medications Ordered in ED Medications  sodium chloride 0.9 % bolus 1,000 mL (has no administration in time range)  iohexol (OMNIPAQUE) 300 MG/ML solution 100 mL (has no administration in time range)     Initial Impression / Assessment and Plan / ED Course  I have reviewed the triage vital signs and the nursing notes.  Pertinent labs & imaging results that were available during my care of the patient were reviewed by me and considered in my medical decision making (see chart for details).  Clinical Course as of Oct 29 1800  Sun Oct 29, 7865  [id number] 73 year old male with history of prostate cancer treated by radical cystectomy in 2016, on Eliquis for A.  Flutter presents with frank hematuria onset today just prior to arrival in the ER.  Patient reports discomfort over his bladder, denies any pain.  Exam patient is well-appearing, abdomen is soft and nontender, no CVA tenderness.  Vitals are stable, CBC with normal H&H, BMP with mild hypokalemia with potassium of 3.2.  Urinalysis limited due to frank hematuria does show greater than 50 red blood cells and greater than 50 white blood cells with many bacteria and 0-5 epithelials.  Case discussed with Dr. Darl Householder, ER attending, recommends CT scan.  Care signed out to Dr. Darl Householder at change of shift.   [LM]    Clinical Course User Index [LM] Tacy Learn, PA-C      Final Clinical Impressions(s) / ED Diagnoses   Final  diagnoses:  Gross hematuria    ED Discharge Orders    None       Roque Lias 10/30/18 1802    Drenda Freeze, MD 10/30/18 2127

## 2018-10-30 NOTE — ED Notes (Signed)
Called [facility] line, s/w Lurena Joiner - requested consult to Urology, dx Hematuria.

## 2018-11-01 ENCOUNTER — Telehealth: Payer: Self-pay

## 2018-11-01 LAB — URINE CULTURE: Culture: NO GROWTH

## 2018-11-01 NOTE — Telephone Encounter (Signed)
   [facility] Pre-operative Risk Assessment    Request for surgical clearance:  1. What type of surgery is being performed? Colonoscopy   2. When is this surgery scheduled?  TBD   3. What type of clearance is required (medical clearance vs. Pharmacy clearance to hold med vs. Both)? Both  4. Are there any medications that need to be held prior to surgery and how long? Eliquis    5. Practice name and name of physician performing surgery? [facility] -Dr.Le  6. What is your office phone number [phone]    7.   What is your office fax number [phone]  8.   Anesthesia type (None, local, MAC, general) ? Unknown   Ena Dawley 11/01/2018, 4:49 PM  _________________________________________________________________   (provider comments below)

## 2018-11-02 NOTE — Telephone Encounter (Signed)
   Primary Cardiologist: Buford Dresser, MD  Chart reviewed as part of pre-operative protocol coverage. Patient was contacted 11/02/2018 in reference to pre-operative risk assessment for pending surgery as outlined below.  Charles Cameron was last seen on 08/15/18 by Dr. Harrell Gave. I contacted the patient and he described onset of new dyspnea on exertion over the past 2 weeks. He saw his pulmonologist via telehealth visit less than a week ago but he did not mention this DOE during that visit. I advised him that he needed to be cleared by his pulmonologist and that we would need to see him via telehealth visit before he could be cleared. If this is an elective colonoscopy, I recommend postponing until he is stable.   Due to new or worsening symptoms, Charles Cameron will require a follow-up visit for further pre-operative risk assessment.  Pre-op covering staff: - Please schedule appointment and call patient to inform them. Schedule a telehealth visit with Dr. Harrell Gave or an APP on her team. - Please contact requesting surgeon's office via preferred method (i.e, phone, fax) to inform them of need for appointment prior to surgery.  Tami Lin Duke, PA 11/02/2018, 2:19 PM

## 2018-11-02 NOTE — Telephone Encounter (Signed)
Patient with diagnosis of afib on Eliquis for anticoagulation.    Procedure: colonoscopy Date of procedure: TBD  CHADS2-VASc score of  2 (CHF, HTN, AGE, DM2, stroke/tia x 2, CAD, AGE, male)  Per office protocol, patient can hold Eliquis for 1 days prior to procedure.

## 2018-11-02 NOTE — Telephone Encounter (Signed)
Request guidance on Eliquis, this guidance will be noted to be good for the next 2 months.

## 2018-11-04 ENCOUNTER — Encounter (HOSPITAL_BASED_OUTPATIENT_CLINIC_OR_DEPARTMENT_OTHER): Payer: Self-pay | Admitting: *Deleted

## 2018-11-04 ENCOUNTER — Other Ambulatory Visit: Payer: Self-pay

## 2018-11-04 ENCOUNTER — Emergency Department (HOSPITAL_BASED_OUTPATIENT_CLINIC_OR_DEPARTMENT_OTHER)
Admission: EM | Admit: 2018-11-04 | Discharge: 2018-11-04 | Disposition: A | Discharge status: Home / Self Care | Payer: Medicare Other | Attending: Emergency Medicine | Admitting: Emergency Medicine

## 2018-11-04 DIAGNOSIS — I10 Essential (primary) hypertension: Secondary | ICD-10-CM | POA: Diagnosis not present

## 2018-11-04 DIAGNOSIS — R310 Gross hematuria: Secondary | ICD-10-CM | POA: Insufficient documentation

## 2018-11-04 DIAGNOSIS — Z79899 Other long term (current) drug therapy: Secondary | ICD-10-CM | POA: Insufficient documentation

## 2018-11-04 DIAGNOSIS — R3911 Hesitancy of micturition: Secondary | ICD-10-CM | POA: Insufficient documentation

## 2018-11-04 DIAGNOSIS — Z87891 Personal history of nicotine dependence: Secondary | ICD-10-CM | POA: Insufficient documentation

## 2018-11-04 DIAGNOSIS — Z8546 Personal history of malignant neoplasm of prostate: Secondary | ICD-10-CM | POA: Insufficient documentation

## 2018-11-04 DIAGNOSIS — R319 Hematuria, unspecified: Secondary | ICD-10-CM | POA: Diagnosis present

## 2018-11-04 DIAGNOSIS — Z7901 Long term (current) use of anticoagulants: Secondary | ICD-10-CM | POA: Diagnosis not present

## 2018-11-04 LAB — URINALYSIS, ROUTINE W REFLEX MICROSCOPIC

## 2018-11-04 LAB — CBC WITH DIFFERENTIAL/PLATELET
Abs Immature Granulocytes: 0.02 10*3/uL (ref 0.00–0.07)
Basophils Absolute: 0 10*3/uL (ref 0.0–0.1)
Basophils Relative: 1 %
Eosinophils Absolute: 0.2 10*3/uL (ref 0.0–0.5)
Eosinophils Relative: 3 %
HCT: 44.3 % (ref 39.0–52.0)
Hemoglobin: 14.8 g/dL (ref 13.0–17.0)
Immature Granulocytes: 0 %
Lymphocytes Relative: 26 %
Lymphs Abs: 1.6 10*3/uL (ref 0.7–4.0)
MCH: 31.8 pg (ref 26.0–34.0)
MCHC: 33.4 g/dL (ref 30.0–36.0)
MCV: 95.3 fL (ref 80.0–100.0)
Monocytes Absolute: 0.7 10*3/uL (ref 0.1–1.0)
Monocytes Relative: 11 %
Neutro Abs: 3.7 10*3/uL (ref 1.7–7.7)
Neutrophils Relative %: 59 %
Platelets: 183 10*3/uL (ref 150–400)
RBC: 4.65 MIL/uL (ref 4.22–5.81)
RDW: 13.2 % (ref 11.5–15.5)
WBC: 6.2 10*3/uL (ref 4.0–10.5)
nRBC: 0 % (ref 0.0–0.2)

## 2018-11-04 LAB — URINALYSIS, MICROSCOPIC (REFLEX): RBC / HPF: >50 RBC/hpf (ref 0–5)

## 2018-11-04 NOTE — ED Provider Notes (Signed)
[facility] EMERGENCY DEPARTMENT Provider Note   CSN: [id number] Arrival date & time: 11/04/18  1415    History   Chief Complaint Chief Complaint  Patient presents with  . Hematuria    HPI Charles Cameron is a 73 y.o. male.     Patient with history of prostatectomy, on Eliquis for atrial flutter --presents the emergency department with continued blood noted in urine.  Patient was seen in the emergency department for the same on 10/30/2018.  He had a CT scan showing an intrarenal stone and no other problems.  He was started on Keflex for UTI and had a culture which has since come back with no growth.  He was instructed to discontinue his anticoagulant for 2 days and his urine cleared.  He restarted this.  2 nights ago he developed more bleeding which is been intermittent but persistent since last night.  Patient has noted clots being passed in his urine.  He has had some intermittent hesitancy of his urinary stream but no retention.  He denies any abdominal pain.  No lightheadedness, shortness of breath, syncope, or chest pains.  He stopped his anticoagulant on the advice of his cardiologist.  Last dose was 48 hours ago.   He notes that he has a cystoscopy scheduled by his urologist on 11/15/2018.     Past Medical History:  Diagnosis Date  . Anticoagulation adequate 07/14/2018  . Cancer ([place])   . Edema 07/14/2018  . Hypertension   . Prostate cancer ([place]) 2016  . URI (upper respiratory infection) 07/14/2018    Patient Active Problem List   Diagnosis Date Noted  . Essential hypertension 09/06/2018  . Long term (current) use of anticoagulants 08/15/2018  . Paroxysmal atrial fibrillation ([place]) 08/15/2018  . Atrial flutter ([place]) 07/10/2018    Past Surgical History:  Procedure Laterality Date  . LUMBAR LAMINECTOMY  1989        Home Medications    Prior to Admission medications   Medication Sig Start Date End Date Taking? Authorizing Provider  apixaban (ELIQUIS) 5 MG  TABS tablet Take 1 tablet (5 mg total) by mouth 2 (two) times daily. 07/14/18   Isaiah Serge, NP  cephALEXin (KEFLEX) 500 MG capsule Take 1 capsule (500 mg total) by mouth 3 (three) times daily. 10/30/18   Drenda Freeze, MD  chlorthalidone (HYGROTON) 25 MG tablet Take 0.5 tablets (12.5 mg total) by mouth daily. 10/03/18   Buford Dresser, MD  diltiazem (CARDIZEM CD) 360 MG 24 hr capsule Take 1 capsule (360 mg total) by mouth daily. 07/15/18   Isaiah Serge, NP  furosemide (LASIX) 40 MG tablet Take 1 tablet (40 mg total) by mouth as needed (Weight gain of 3 lbs in 1 day or 5 lbs in one week). 07/18/18   Buford Dresser, MD  levocetirizine (XYZAL) 5 MG tablet Take 5 mg by mouth every evening.    [provider]  metoprolol tartrate (LOPRESSOR) 25 MG tablet Take 1 tablet (25 mg total) by mouth as needed (Heart Rate greater than 105). 07/18/18   Buford Dresser, MD  montelukast (SINGULAIR) 10 MG tablet Take 10 mg by mouth at bedtime.    [provider]    Family History Family History  Problem Relation Age of Onset  . Heart attack Mother   . Hypertension Mother   . Prostate cancer Father   . Hypertension Father   . Colon cancer Other   . Diabetes Paternal Grandfather  Social History Social History   Tobacco Use  . Smoking status: Former Smoker    Packs/day: 2.00    Years: 27.00    Pack years: 54.00    Types: Cigarettes    Last attempt to quit: 07/14/1983    Years since quitting: 35.3  . Smokeless tobacco: Never Used  Substance Use Topics  . Alcohol use: Yes    Comment: light use; rarely  . Drug use: No     Allergies   Patient has no known allergies.   Review of Systems Review of Systems  Constitutional: Negative for fever.  HENT: Negative for rhinorrhea and sore throat.   Eyes: Negative for redness.  Respiratory: Negative for cough.   Cardiovascular: Negative for chest pain.  Gastrointestinal: Negative for abdominal pain,  diarrhea, nausea and vomiting.  Genitourinary: Positive for hematuria. Negative for dysuria.  Musculoskeletal: Negative for myalgias.  Skin: Negative for rash.  Neurological: Negative for syncope, light-headedness and headaches.     Physical Exam Updated Vital Signs BP (!) 141/91   Pulse 68   Temp 98.1 F (36.7 C)   Resp 16   Ht 5\' 10"  (1.778 m)   Wt 102 kg   SpO2 97%   BMI 32.27 kg/m   Physical Exam Vitals signs and nursing note reviewed.  Constitutional:      Appearance: He is well-developed.  HENT:     Head: Normocephalic and atraumatic.  Eyes:     General:        Right eye: No discharge.        Left eye: No discharge.     Conjunctiva/sclera: Conjunctivae normal.  Neck:     Musculoskeletal: Normal range of motion and neck supple.  Cardiovascular:     Rate and Rhythm: Normal rate.  Pulmonary:     Effort: Pulmonary effort is normal.     Breath sounds: Normal breath sounds.  Abdominal:     Tenderness: There is no abdominal tenderness.  Skin:    General: Skin is warm and dry.  Neurological:     Mental Status: He is alert.      ED Treatments / Results  Labs (all labs ordered are listed, but only abnormal results are displayed) Labs Reviewed  URINALYSIS, ROUTINE W REFLEX MICROSCOPIC - Abnormal; Notable for the following components:      Result Value   Color, Urine RED (*)    APPearance TURBID (*)    Glucose, UA   (*)    Value: TEST NOT REPORTED DUE TO COLOR INTERFERENCE OF URINE PIGMENT   Hgb urine dipstick   (*)    Value: TEST NOT REPORTED DUE TO COLOR INTERFERENCE OF URINE PIGMENT   Bilirubin Urine   (*)    Value: TEST NOT REPORTED DUE TO COLOR INTERFERENCE OF URINE PIGMENT   Ketones, ur   (*)    Value: TEST NOT REPORTED DUE TO COLOR INTERFERENCE OF URINE PIGMENT   Protein, ur   (*)    Value: TEST NOT REPORTED DUE TO COLOR INTERFERENCE OF URINE PIGMENT   Nitrite   (*)    Value: TEST NOT REPORTED DUE TO COLOR INTERFERENCE OF URINE PIGMENT    Leukocytes,Ua   (*)    Value: TEST NOT REPORTED DUE TO COLOR INTERFERENCE OF URINE PIGMENT   All other components within normal limits  URINALYSIS, MICROSCOPIC (REFLEX) - Abnormal; Notable for the following components:   Bacteria, UA MANY (*)    All other components within normal limits  CBC WITH  DIFFERENTIAL/PLATELET    EKG None  Radiology No results found.  Procedures Procedures (including critical care time)  Medications Ordered in ED Medications - No data to display   Initial Impression / Assessment and Plan / ED Course  I have reviewed the triage vital signs and the nursing notes.  Pertinent labs & imaging results that were available during my care of the patient were reviewed by me and considered in my medical decision making (see chart for details).        Patient seen and examined.  I reviewed recent ED visit and conversations between the patient and his urologist and cardiologist.  Vital signs reviewed and are as follows: BP (!) 141/91   Pulse 68   Temp 98.1 F (36.7 C)   Resp 16   Ht 5\' 10"  (1.778 m)   Wt 102 kg   SpO2 97%   BMI 32.27 kg/m   Results discussed with Dr. Rogene Houston who has seen patient.  No indications for intervention today or further work-up.  Patient will need to follow-up with his urologist.  Agree with temporarily discontinuing anticoagulation.  His hemoglobin is unchanged from 5 days ago and vital signs are normal.  Discussed with patient that we would not want to place a catheter today unless he had complete obstruction of his urine stream as catheter because additional bleeding and trauma as well as increases risk of infection.  He will complete course of antibiotic.  Encourage patient to return to the emergency department if he does develop difficulty with voiding or new symptoms including fever or abdominal pain.  Final Clinical Impressions(s) / ED Diagnoses   Final diagnoses:  Gross hematuria   Patient with previous prostatectomy  with ongoing gross hematuria in the setting of anticoagulation use.  Fortunately, his hemoglobin is stable despite his ongoing bleeding.  He does not have any signs of an acute UTI today.  Recent culture was negative.  He is still able to pass urine without obstruction.  Treatment plan currently as above.  He has appropriate follow-up in place although he is anxious about the cause of his bleeding.  He understands need to return if symptoms worsen.  ED Discharge Orders    None       Carlisle Cater, Hershal Coria 11/04/18 1608    Fredia Sorrow, MD 11/06/18 Joen Laura

## 2018-11-04 NOTE — Telephone Encounter (Signed)
E-visit scheduled for 11/15/18 at 8:20 am with Dr. Harrell Gave.  Virtual Visit Pre-Appointment Phone Call  "(Name), I am calling you today to discuss your upcoming appointment. We are currently trying to limit exposure to the virus that causes COVID-19 by seeing patients at home rather than in the office."  1. "What is the BEST phone number to call the day of the visit?" - include this in appointment notes  2. "Do you have or have access to (through a family member/friend) a smartphone with video capability that we can use for your visit?" a. If yes - list this number in appt notes as "cell" (if different from BEST phone #) and list the appointment type as a VIDEO visit in appointment notes b. If no - list the appointment type as a PHONE visit in appointment notes  3. Confirm consent - "In the setting of the current Covid19 crisis, you are scheduled for a (phone or video) visit with your provider on (date) at (time).  Just as we do with many in-office visits, in order for you to participate in this visit, we must obtain consent.  If you'd like, I can send this to your mychart (if signed up) or email for you to review.  Otherwise, I can obtain your verbal consent now.  All virtual visits are billed to your insurance company just like a normal visit would be.  By agreeing to a virtual visit, we'd like you to understand that the technology does not allow for your provider to perform an examination, and thus may limit your provider's ability to fully assess your condition. If your provider identifies any concerns that need to be evaluated in person, we will make arrangements to do so.  Finally, though the technology is pretty good, we cannot assure that it will always work on either your or our end, and in the setting of a video visit, we may have to convert it to a phone-only visit.  In either situation, we cannot ensure that we have a secure connection.  Are you willing to proceed?" STAFF: Did the  patient verbally acknowledge consent to telehealth visit? Document YES/NO here: Yes  4. Advise patient to be prepared - "Two hours prior to your appointment, go ahead and check your blood pressure, pulse, oxygen saturation, and your weight (if you have the equipment to check those) and write them all down. When your visit starts, your provider will ask you for this information. If you have an Apple Watch or Kardia device, please plan to have heart rate information ready on the day of your appointment. Please have a pen and paper handy nearby the day of the visit as well."  5. Give patient instructions for MyChart download to smartphone OR Doximity/Doxy.me as below if video visit (depending on what platform provider is using)  6. Inform patient they will receive a phone call 15 minutes prior to their appointment time (may be from unknown caller ID) so they should be prepared to answer    TELEPHONE CALL NOTE  Charles Cameron has been deemed a candidate for a follow-up tele-health visit to limit community exposure during the Covid-19 pandemic. I spoke with the patient via phone to ensure availability of phone/video source, confirm preferred email & phone number, and discuss instructions and expectations.  I reminded Charles Cameron to be prepared with any vital sign and/or heart rhythm information that could potentially be obtained via home monitoring, at the time of his visit. I reminded  Charles Cameron to expect a phone call prior to his visit.  Meryl Crutch, RN 11/04/2018 8:43 AM   INSTRUCTIONS FOR DOWNLOADING THE MYCHART APP TO SMARTPHONE  - The patient must first make sure to have activated MyChart and know their login information - If Apple, go to [employer] and type in MyChart in the search bar and download the app. If Android, ask patient to go to [employer] and type in [place] in the search bar and download the app. The app is free but as with any other app downloads, their phone  may require them to verify saved payment information or Apple/Android password.  - The patient will need to then log into the app with their MyChart username and password, and select [facility] as their healthcare provider to link the account. When it is time for your visit, go to the MyChart app, find appointments, and click Begin Video Visit. Be sure to Select Allow for your device to access the Microphone and Camera for your visit. You will then be connected, and your provider will be with you shortly.  **If they have any issues connecting, or need assistance please contact MyChart service desk [phone])**  **If using a computer, in order to ensure the best quality for their visit they will need to use either of the following Internet Browsers: [employer], or Google Chrome**  IF USING DOXIMITY or DOXY.ME - The patient will receive a link just prior to their visit by text.     FULL LENGTH CONSENT FOR TELE-HEALTH VISIT   I hereby voluntarily request, consent and authorize [facility] and its employed or contracted physicians, physician assistants, nurse practitioners or other licensed health care professionals (the Practitioner), to provide me with telemedicine health care services (the "Services") as deemed necessary by the treating Practitioner. I acknowledge and consent to receive the Services by the Practitioner via telemedicine. I understand that the telemedicine visit will involve communicating with the Practitioner through live audiovisual communication technology and the disclosure of certain medical information by electronic transmission. I acknowledge that I have been given the opportunity to request an in-person assessment or other available alternative prior to the telemedicine visit and am voluntarily participating in the telemedicine visit.  I understand that I have the right to withhold or withdraw my consent to the use of telemedicine in the course of my  care at any time, without affecting my right to future care or treatment, and that the Practitioner or I may terminate the telemedicine visit at any time. I understand that I have the right to inspect all information obtained and/or recorded in the course of the telemedicine visit and may receive copies of available information for a reasonable fee.  I understand that some of the potential risks of receiving the Services via telemedicine include:  Marland Kitchen Delay or interruption in medical evaluation due to technological equipment failure or disruption; . Information transmitted may not be sufficient (e.g. poor resolution of images) to allow for appropriate medical decision making by the Practitioner; and/or  . In rare instances, security protocols could fail, causing a breach of personal health information.  Furthermore, I acknowledge that it is my responsibility to provide information about my medical history, conditions and care that is complete and accurate to the best of my ability. I acknowledge that Practitioner's advice, recommendations, and/or decision may be based on factors not within their control, such as incomplete or inaccurate data provided by me or distortions of  diagnostic images or specimens that may result from electronic transmissions. I understand that the practice of medicine is not an exact science and that Practitioner makes no warranties or guarantees regarding treatment outcomes. I acknowledge that I will receive a copy of this consent concurrently upon execution via email to the email address I last provided but may also request a printed copy by calling the office of [facility].    I understand that my insurance will be billed for this visit.   I have read or had this consent read to me. . I understand the contents of this consent, which adequately explains the benefits and risks of the Services being provided via telemedicine.  . I have been provided ample opportunity to ask  questions regarding this consent and the Services and have had my questions answered to my satisfaction. . I give my informed consent for the services to be provided through the use of telemedicine in my medical care  By participating in this telemedicine visit I agree to the above.

## 2018-11-04 NOTE — ED Provider Notes (Signed)
Medical screening examination/treatment/procedure(s) were conducted as a shared visit with non-physician practitioner(s) and myself.  I personally evaluated the patient during the encounter.  None  Patient seen by me along with physician assistant.  Patient with persistent hematuria that appears not to be consistent with urinary tract infection.  Patient is followed by [facility] urology.  Has an appointment in the first week in May.  He can try to move that up.  Patient still voiding okay.  Patient's labs in particular his hemoglobin is 14.8.  Vital signs without any significant abnormalities.  He is already been in contact with his urologist and they are planning to do a cystoscope patient is going try to see if he can do that earlier.  Patient will return if he has any difficulty voiding if that occurs he may very well require catheter to be placed.  But no indication for catheter at this time.   Fredia Sorrow, MD 11/04/18 [phone]

## 2018-11-04 NOTE — Discharge Instructions (Signed)
Your blood counts were normal today and your urine test shows blood in your urine.  We do not suspect significant infection at this point.  Continue to hydrate yourself well.  Keep in contact with your cardiologist regarding your blood thinner and when it is safe to restart this.  Please also let your urologist know if you continue to have bleeding and follow-up on May 5 as planned.  If you have worsening difficulty with urination, cannot urinate or new symptoms such as fever or abdominal pain --please return to the emergency department to be reevaluated.

## 2018-11-04 NOTE — ED Triage Notes (Signed)
Pt c/o blood in urine x 2 days here for same earlier this week , pt is able to void

## 2018-11-14 ENCOUNTER — Telehealth: Payer: Self-pay | Admitting: Cardiology

## 2018-11-14 NOTE — Telephone Encounter (Signed)
Smartphone/consent/ my chart/ pre reg completed °

## 2018-11-15 ENCOUNTER — Telehealth (INDEPENDENT_AMBULATORY_CARE_PROVIDER_SITE_OTHER): Payer: Medicare Other | Admitting: Cardiology

## 2018-11-15 ENCOUNTER — Telehealth: Payer: Self-pay | Admitting: *Deleted

## 2018-11-15 ENCOUNTER — Encounter: Payer: Self-pay | Admitting: Cardiology

## 2018-11-15 VITALS — BP 135/89 | HR 65 | Ht 70.0 in | Wt 233.0 lb

## 2018-11-15 DIAGNOSIS — Z7901 Long term (current) use of anticoagulants: Secondary | ICD-10-CM

## 2018-11-15 DIAGNOSIS — I10 Essential (primary) hypertension: Secondary | ICD-10-CM

## 2018-11-15 DIAGNOSIS — R319 Hematuria, unspecified: Secondary | ICD-10-CM

## 2018-11-15 DIAGNOSIS — I480 Paroxysmal atrial fibrillation: Secondary | ICD-10-CM

## 2018-11-15 DIAGNOSIS — R0609 Other forms of dyspnea: Secondary | ICD-10-CM

## 2018-11-15 DIAGNOSIS — I4892 Unspecified atrial flutter: Secondary | ICD-10-CM

## 2018-11-15 NOTE — Patient Instructions (Signed)

## 2018-11-15 NOTE — Telephone Encounter (Signed)
Cardiac Questionnaire:    Since your last visit or hospitalization:    1. Have you been having new or worsening chest pain? NO   2. Have you been having new or worsening shortness of breath? NO 3. Have you been having new or worsening leg swelling, wt gain, or increase in abdominal girth (pants fitting more tightly)? NO   4. Have you had any passing out spells? NO    *A YES to any of these questions would result in the appointment being kept. *If all the answers to these questions are NO, we should indicate that given the current situation regarding the worldwide coronarvirus pandemic, at the recommendation of the CDC, we are looking to limit gatherings in our waiting area, and thus will reschedule their appointment beyond four weeks from today.   _____________   [id number] Pre-Screening Questions:  . Do you currently have a fever? NO . Have you recently travelled on a cruise, internationally, or to [place], [place], [place], [place], [place], or [place], [place] [employer])? NO . Have you been in contact with someone that is currently pending confirmation of Covid19 testing or has been confirmed to have the [place] virus?  NO . Are you currently experiencing fatigue or cough? NO          Virtual Visit Pre-Appointment Phone Call  "(Name), I am calling you today to discuss your upcoming appointment. We are currently trying to limit exposure to the virus that causes COVID-19 by seeing patients at home rather than in the office."  1. "What is the BEST phone number to call the day of the visit?" - include this in appointment notes  2. "Do you have or have access to (through a family member/friend) a smartphone with video capability that we can use for your visit?" a. If yes - list this number in appt notes as "cell" (if different from BEST phone #) and list the appointment type as a VIDEO visit in appointment notes b. If no - list the appointment type as a PHONE visit in appointment notes  3. Confirm  consent - "In the setting of the current Covid19 crisis, you are scheduled for a (phone or video) visit with your provider on (date) at (time).  Just as we do with many in-office visits, in order for you to participate in this visit, we must obtain consent.  If you'd like, I can send this to your mychart (if signed up) or email for you to review.  Otherwise, I can obtain your verbal consent now.  All virtual visits are billed to your insurance company just like a normal visit would be.  By agreeing to a virtual visit, we'd like you to understand that the technology does not allow for your provider to perform an examination, and thus may limit your provider's ability to fully assess your condition. If your provider identifies any concerns that need to be evaluated in person, we will make arrangements to do so.  Finally, though the technology is pretty good, we cannot assure that it will always work on either your or our end, and in the setting of a video visit, we may have to convert it to a phone-only visit.  In either situation, we cannot ensure that we have a secure connection.  Are you willing to proceed?" STAFF: Did the patient verbally acknowledge consent to telehealth visit? Document YES/NO here: YES  4. Advise patient to be prepared - "Two hours prior to your appointment, go ahead and check your  blood pressure, pulse, oxygen saturation, and your weight (if you have the equipment to check those) and write them all down. When your visit starts, your provider will ask you for this information. If you have an Apple Watch or Kardia device, please plan to have heart rate information ready on the day of your appointment. Please have a pen and paper handy nearby the day of the visit as well."  5. Give patient instructions for MyChart download to smartphone OR Doximity/Doxy.me as below if video visit (depending on what platform provider is using)  6. Inform patient they will receive a phone call 15 minutes prior  to their appointment time (may be from unknown caller ID) so they should be prepared to answer    TELEPHONE CALL NOTE  Charles Cameron has been deemed a candidate for a follow-up tele-health visit to limit community exposure during the Covid-19 pandemic. I spoke with the patient via phone to ensure availability of phone/video source, confirm preferred email & phone number, and discuss instructions and expectations.  I reminded Charles Cameron to be prepared with any vital sign and/or heart rhythm information that could potentially be obtained via home monitoring, at the time of his visit. I reminded Charles Cameron to expect a phone call prior to his visit.  Venetia Maxon, CMA 11/15/2018 7:56 AM   INSTRUCTIONS FOR DOWNLOADING THE MYCHART APP TO SMARTPHONE  - The patient must first make sure to have activated MyChart and know their login information - If Apple, go to [employer] and type in MyChart in the search bar and download the app. If Android, ask patient to go to [employer] and type in [place] in the search bar and download the app. The app is free but as with any other app downloads, their phone may require them to verify saved payment information or Apple/Android password.  - The patient will need to then log into the app with their MyChart username and password, and select [facility] as their healthcare provider to link the account. When it is time for your visit, go to the MyChart app, find appointments, and click Begin Video Visit. Be sure to Select Allow for your device to access the Microphone and Camera for your visit. You will then be connected, and your provider will be with you shortly.  **If they have any issues connecting, or need assistance please contact MyChart service desk [phone])**  **If using a computer, in order to ensure the best quality for their visit they will need to use either of the following Internet Browsers: [employer],  or Google Chrome**  IF USING DOXIMITY or DOXY.ME - The patient will receive a link just prior to their visit by text.     FULL LENGTH CONSENT FOR TELE-HEALTH VISIT   I hereby voluntarily request, consent and authorize [facility] and its employed or contracted physicians, physician assistants, nurse practitioners or other licensed health care professionals (the Practitioner), to provide me with telemedicine health care services (the "Services") as deemed necessary by the treating Practitioner. I acknowledge and consent to receive the Services by the Practitioner via telemedicine. I understand that the telemedicine visit will involve communicating with the Practitioner through live audiovisual communication technology and the disclosure of certain medical information by electronic transmission. I acknowledge that I have been given the opportunity to request an in-person assessment or other available alternative prior to the telemedicine visit and am voluntarily participating in the telemedicine visit.  I  understand that I have the right to withhold or withdraw my consent to the use of telemedicine in the course of my care at any time, without affecting my right to future care or treatment, and that the Practitioner or I may terminate the telemedicine visit at any time. I understand that I have the right to inspect all information obtained and/or recorded in the course of the telemedicine visit and may receive copies of available information for a reasonable fee.  I understand that some of the potential risks of receiving the Services via telemedicine include:  Marland Kitchen Delay or interruption in medical evaluation due to technological equipment failure or disruption; . Information transmitted may not be sufficient (e.g. poor resolution of images) to allow for appropriate medical decision making by the Practitioner; and/or  . In rare instances, security protocols could fail, causing a breach of personal health  information.  Furthermore, I acknowledge that it is my responsibility to provide information about my medical history, conditions and care that is complete and accurate to the best of my ability. I acknowledge that Practitioner's advice, recommendations, and/or decision may be based on factors not within their control, such as incomplete or inaccurate data provided by me or distortions of diagnostic images or specimens that may result from electronic transmissions. I understand that the practice of medicine is not an exact science and that Practitioner makes no warranties or guarantees regarding treatment outcomes. I acknowledge that I will receive a copy of this consent concurrently upon execution via email to the email address I last provided but may also request a printed copy by calling the office of [facility].    I understand that my insurance will be billed for this visit.   I have read or had this consent read to me. . I understand the contents of this consent, which adequately explains the benefits and risks of the Services being provided via telemedicine.  . I have been provided ample opportunity to ask questions regarding this consent and the Services and have had my questions answered to my satisfaction. . I give my informed consent for the services to be provided through the use of telemedicine in my medical care  By participating in this telemedicine visit I agree to the above.

## 2018-11-15 NOTE — Progress Notes (Signed)
Virtual Visit via Phone Note   This visit type was conducted due to national recommendations for restrictions regarding the COVID-19 Pandemic (e.g. social distancing) in an effort to limit this patient's exposure and mitigate transmission in our community.  Due to his co-morbid illnesses, this patient is at least at moderate risk for complications without adequate follow up.  This format is felt to be most appropriate for this patient at this time.  All issues noted in this document were discussed and addressed.  A limited physical exam was performed with this format.  Please refer to the patient's chart for his consent to telehealth for [facility].   Date:  11/15/2018   ID:  Charles Cameron, DOB 1945/11/23, MRN [id number]  Patient Location: Home Provider Location: Home  PCP:  Lauraine Rinne, MD  Cardiologist:  Buford Dresser, MD  Electrophysiologist:  None   Evaluation Performed:  Follow-Up Visit  Chief Complaint:  Shortness of breath, hematuria  History of Present Illness:    Charles Cameron is a 73 y.o. male with paroxysmal atrial flutter/atrial fibrillation (admitted 06/2018 with URI, new AF/AFL).  The patient does not have symptoms concerning for COVID-19 infection (fever, chills, cough, or new shortness of breath).   Concerns today:  Mild shortness of breath when he does activity. Has gained a little weight since being home from social distancing. Has not noticed any changes in breathing at rest. No PND or orthopnea. Mild symptoms, nonlimiting. Stable mild LE edema (sock lines). Reviewed weight monitoring, when to be concerned about fluid buildup.  Since 2/26, BP cuff has only noted abnormal rhyhtm 4 times, last time mid-March. Does not feel any palpitations. Up until recent hematuria, was tolerating medications well without issues.  Reviewed recent bouts of hematuria, ER visits. He has an appt pending with urology today to further investigate this. Bleeding  stopped with stopping DOAC, but when rechallenged bleeding returned. Last ER visit notes stable blood counts. UA did show bacteria and WBC as well as RBC. CT on 4/19 showed 9 mm left renal calculus with possible bladder hemorrhage.   Past Medical History:  Diagnosis Date  . Anticoagulation adequate 07/14/2018  . Cancer ([place])   . Edema 07/14/2018  . Hypertension   . Prostate cancer ([place]) 2016  . URI (upper respiratory infection) 07/14/2018   Past Surgical History:  Procedure Laterality Date  . LUMBAR LAMINECTOMY  1989     Current Meds  Medication Sig  . chlorthalidone (HYGROTON) 25 MG tablet Take 0.5 tablets (12.5 mg total) by mouth daily.  Marland Kitchen diltiazem (CARDIZEM CD) 360 MG 24 hr capsule Take 1 capsule (360 mg total) by mouth daily.  . furosemide (LASIX) 40 MG tablet Take 1 tablet (40 mg total) by mouth as needed (Weight gain of 3 lbs in 1 day or 5 lbs in one week).  Marland Kitchen levocetirizine (XYZAL) 5 MG tablet Take 5 mg by mouth every evening.  . metoprolol tartrate (LOPRESSOR) 25 MG tablet Take 1 tablet (25 mg total) by mouth as needed (Heart Rate greater than 105).  . montelukast (SINGULAIR) 10 MG tablet Take 10 mg by mouth at bedtime.  . [DISCONTINUED] cephALEXin (KEFLEX) 500 MG capsule Take 1 capsule (500 mg total) by mouth 3 (three) times daily.     Allergies:   Patient has no known allergies.   Social History   Tobacco Use  . Smoking status: Former Smoker    Packs/day: 2.00    Years: 27.00    Pack years:  54.00    Types: Cigarettes    Last attempt to quit: 07/14/1983    Years since quitting: 35.3  . Smokeless tobacco: Never Used  Substance Use Topics  . Alcohol use: Yes    Comment: light use; rarely  . Drug use: No     Family Hx: The patient's family history includes Colon cancer in an other family member; Diabetes in his paternal grandfather; Heart attack in his mother; Hypertension in his father and mother; Prostate cancer in his father.  ROS:   Please see the history of  present illness.    ROS otherwise negative   Prior CV studies:   The following studies were reviewed today: Prior echo  Labs/Other Tests and Data Reviewed:    EKG:  An ECG dated 08/15/18 was personally reviewed today and demonstrated:  sinus bradycardia with RBBB  Recent Labs: 07/10/2018: B Natriuretic Peptide 317.2 07/12/2018: Magnesium 2.1 07/14/2018: ALT 49; TSH 1.914 10/30/2018: BUN 22; Creatinine, Ser 0.98; Potassium 3.2; Sodium 141 11/04/2018: Hemoglobin 14.8; Platelets 183   Recent Lipid Panel No results found for: CHOL, TRIG, HDL, CHOLHDL, LDLCALC, LDLDIRECT  Wt Readings from Last 3 Encounters:  11/15/18 233 lb (105.7 kg)  11/04/18 224 lb 13.9 oz (102 kg)  10/30/18 225 lb (102.1 kg)     Objective:    Vital Signs:  BP 135/89   Pulse 65   Ht 5\' 10"  (1.778 m)   Wt 233 lb (105.7 kg)   BMI 33.43 kg/m   Speaking comfortably on the phone, no respiratory distress  ASSESSMENT & PLAN:    Hematuria: has appt with urology/cystoscopy scheduled to attempt to determine source. Anticoagulation on hold until cause of bleeding determined. Given that current bleeding has more risk at this time than daily risk of stroke, would hold anticoagulation until cleared by urology to rechallenge. Does have a history of prostate cancer  Shortness of breath: mild, with exertion. Does have a history of bronchitis in the past. Notes weight gain with social distancing. Mild LE edema (sock lines) but hasn't noticed other swelling. Able to walk >1 mile around the neighborhood. Reviewed weight monitoring instructions, will call us if this progresses.  Paroxysmal Atrial flutter (initially typical, then atypical), also with paroxysmal atrial fibrillation during hospitalization for URI: -Normal echo -reports intermittent arrhythmia based on his blood pressure cuff, but he is asymptomatic and has not seen this in >1 month -continue diltiazem CR for rate control. Has PRN metoprolol for HR control and PRN  lasix for swelling -CHA2DS2/VAS Stroke Risk Points=1 for age only. Plan had been to continue anticoagulation in case of need for cardioversion, also noted that his CV score will increase to 2 when he turns 73. Given risk, ok to hold anticoagulation indefinitely for now while hematuria evaluated.  Hypertension: appreciate assistance from Pharm[facility] HTN clinic. Goal 130/80, just above goal today. Had BMET in ER with K 3.2, Cr stable at 0.98. Would recheck BMET next time in office given prior K, would not increase chlorthalidone at this time. Continue to follow.  COVID-19 Education: The signs and symptoms of COVID-19 were discussed with the patient and how to seek care for testing (follow up with PCP or arrange E-visit).  The importance of social distancing was discussed today.  Patient Instructions  Medication Instructions:  Your Physician recommend you continue on your current medication as directed.    If you need a refill on your cardiac medications before your next appointment, please call your pharmacy.   Lab work: None  Testing/Procedures: None  Follow-Up: At [facility], you and your health needs are our priority.  As part of our continuing mission to provide you with exceptional heart care, we have created designated Provider Care Teams.  These Care Teams include your primary Cardiologist (physician) and Advanced Practice Providers (APPs -  Physician Assistants and Nurse Practitioners) who all work together to provide you with the care you need, when you need it. You will need a follow up appointment in 3 months.  Please call our office 2 months in advance to schedule this appointment.  You may see Buford Dresser, MD or one of the following Advanced Practice Providers on your designated Care Team:   Rosaria Ferries, PA-C . Jory Sims, DNP, ANP       Medication Adjustments/Labs and Tests Ordered: Current medicines are reviewed at length with the patient today.   Concerns regarding medicines are outlined above.   Tests Ordered: No orders of the defined types were placed in this encounter.   Medication Changes: No orders of the defined types were placed in this encounter.   Disposition:  Follow up 3 mos with repeat BMET  Signed, Buford Dresser, MD  11/15/2018 9:34 PM    [place]

## 2019-01-31 ENCOUNTER — Other Ambulatory Visit: Payer: Self-pay | Admitting: Cardiology

## 2019-01-31 NOTE — Telephone Encounter (Signed)
Please advise if ok to refill Diltiazem 360 mg tablet qd. Last filled by Dorene Ar.

## 2019-02-13 ENCOUNTER — Encounter: Payer: Self-pay | Admitting: Cardiology

## 2019-02-13 ENCOUNTER — Other Ambulatory Visit: Payer: Self-pay

## 2019-02-13 ENCOUNTER — Ambulatory Visit (INDEPENDENT_AMBULATORY_CARE_PROVIDER_SITE_OTHER): Payer: Medicare Other | Admitting: Cardiology

## 2019-02-13 VITALS — BP 125/73 | HR 66 | Ht 70.0 in | Wt 224.0 lb

## 2019-02-13 DIAGNOSIS — I4892 Unspecified atrial flutter: Secondary | ICD-10-CM | POA: Diagnosis not present

## 2019-02-13 DIAGNOSIS — I480 Paroxysmal atrial fibrillation: Secondary | ICD-10-CM

## 2019-02-13 DIAGNOSIS — I10 Essential (primary) hypertension: Secondary | ICD-10-CM

## 2019-02-13 NOTE — Progress Notes (Signed)
Cardiology Office Note:    Date:  02/13/2019   ID:  Charles Cameron, DOB 12-18-1945, MRN [id number]  PCP:  Lauraine Rinne, MD  Cardiologist:  Buford Dresser, MD PhD  Referring MD: Thornton Dales I, MD   CC: follow up  History of Present Illness:    Charles Cameron is a 73 y.o. male with a hx of recent pneumonia, new atrial flutter with paroxysmal atrial fibrillation with intermittent atrial fibrillation who is seen for follow up. He was discharged on 07/14/18.  Cardiac history: No prior history until 06/2018, when he developed URI and atrial flutter/atrial fibrillation. He had intermittent RVR while in the hospital, requiring multiple medications for management. He was initially on dronedarone (stopped after 30 days), diltiazem, and metoprolol. Had volume overload with arrhythmia, improved with lasix. EF was normal.  Today:  Wants to know how long he has to be on heart meds. We reviewed this, below.  No further hematuria. Has been active, which was usually the trigger for his bleeding. He is on elmiron from urology, plan is to be on this for three months. It stopped the bleeding within the first week.   Has not need lasix at all. Has not needed metoprolol since January. Has not been taking apixaban for 3 mos. Discussed chadsvasc score, risk/benefit. Will revisit after his birthday when he turns 64.  BP cuff has registered twice that he was out of rhythm, most recently two days ago. BP has been running well at home, highest systolic has been 409, lowest in the 110s. No dizzyness/lightheadedness. No fast heart rates on cuff, even when irregular rhythm.   Denies chest pain, shortness of breath at rest or with normal exertion. No PND, orthopnea, LE edema or unexpected weight gain. No syncope or palpitations. 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   Past Medical History:  Diagnosis Date  . Anticoagulation adequate 07/14/2018  . Cancer ([place])   . Edema  07/14/2018  . Hypertension   . Prostate cancer ([place]) 2016  . URI (upper respiratory infection) 07/14/2018    Past Surgical History:  Procedure Laterality Date  . LUMBAR LAMINECTOMY  1989    Current Medications: Current Outpatient Medications on File Prior to Visit  Medication Sig  . chlorthalidone (HYGROTON) 25 MG tablet Take 0.5 tablets (12.5 mg total) by mouth daily.  Marland Kitchen diltiazem (CARDIZEM CD) 360 MG 24 hr capsule Take 1 capsule (360 mg total) by mouth daily.  Marland Kitchen levocetirizine (XYZAL) 5 MG tablet Take 5 mg by mouth every evening.  . montelukast (SINGULAIR) 10 MG tablet Take 10 mg by mouth at bedtime.  . pentosan polysulfate (ELMIRON) 100 MG capsule Take 200 mg by mouth 2 (two) times a day.   No current facility-administered medications on file prior to visit.      Allergies:   Patient has no known allergies.   Social History   Socioeconomic History  . Marital status: Married    Spouse name: Charles Cameron  . Number of children: 5  . Years of education: 26  . Highest education level: Not on file  Occupational History  . Occupation: retired  Scientific laboratory technician  . Financial resource strain: Not on file  . Food insecurity    Worry: Not on file    Inability: Not on file  . Transportation needs    Medical: Not on file    Non-medical: Not on file  Tobacco Use  . Smoking status: Former Smoker    Packs/day: 2.00  Years: 27.00    Pack years: 54.00    Types: Cigarettes    Quit date: 07/14/1983    Years since quitting: 35.6  . Smokeless tobacco: Never Used  Substance and Sexual Activity  . Alcohol use: Yes    Comment: light use; rarely  . Drug use: No  . Sexual activity: Not on file  Lifestyle  . Physical activity    Days per week: Not on file    Minutes per session: Not on file  . Stress: Not on file  Relationships  . Social Herbalist on phone: Not on file    Gets together: Not on file    Attends religious service: Not on file    Active member of club or  organization: Not on file    Attends meetings of clubs or organizations: Not on file    Relationship status: Not on file  Other Topics Concern  . Not on file  Social History Narrative   Patient lives at home with spouse.   Caffeine Use: 2 cups daily     Family History: The patient's family history includes Colon cancer in an other family member; Diabetes in his paternal grandfather; Heart attack in his mother; Hypertension in his father and mother; Prostate cancer in his father.  ROS:   Please see the history of present illness.  Additional pertinent ROS: Constitutional: Negative for chills, fever, night sweats, unintentional weight loss  HENT: Negative for ear pain and hearing loss.   Eyes: Negative for loss of vision and eye pain.  Respiratory: Negative for cough, sputum, wheezing.   Cardiovascular: See HPI. Gastrointestinal: Negative for abdominal pain, melena, and hematochezia.  Genitourinary: Negative for dysuria and hematuria.  Musculoskeletal: Negative for falls and myalgias.  Skin: Negative for itching and rash.  Neurological: Negative for focal weakness, focal sensory changes and loss of consciousness.  Endo/Heme/Allergies: no active bruise/bleeding.     EKGs/Labs/Other Studies Reviewed:    The following studies were reviewed today: Echo 07/11/18 - Left ventricle: The cavity size was normal. Wall thickness was   increased in a pattern of mild LVH. Systolic function was normal.   The estimated ejection fraction was in the range of 55% to 60%.   Wall motion was normal; there were no regional wall motion   abnormalities. There was no evidence of elevated ventricular   filling pressure by Doppler parameters. - Aortic valve: Trileaflet; moderately thickened, moderately   calcified leaflets. Valve mobility was restricted. There was mild   regurgitation. - Mitral valve: There was moderate regurgitation. - Right ventricle: The cavity size was mildly dilated. Wall    thickness was normal. - Right atrium: The atrium was mildly dilated.  EKG:  EKG is personally reviewed.  The ekg ordered 08/15/18 demonstrates sinus bradycardia at 56 bpm with RBBB  Recent Labs: 07/10/2018: B Natriuretic Peptide 317.2 07/12/2018: Magnesium 2.1 07/14/2018: ALT 49; TSH 1.914 10/30/2018: BUN 22; Creatinine, Ser 0.98; Potassium 3.2; Sodium 141 11/04/2018: Hemoglobin 14.8; Platelets 183  Recent Lipid Panel No results found for: CHOL, TRIG, HDL, CHOLHDL, VLDL, LDLCALC, LDLDIRECT  Physical Exam:    VS:  BP 125/73   Pulse 66   Ht 5\' 10"  (1.778 m)   Wt 224 lb (101.6 kg)   SpO2 95%   BMI 32.14 kg/m     Wt Readings from Last 3 Encounters:  02/13/19 224 lb (101.6 kg)  11/15/18 233 lb (105.7 kg)  11/04/18 224 lb 13.9 oz (102 kg)  GEN: Well nourished, well developed in no acute distress HEENT: Normal NECK: No JVD; No carotid bruits CARDIAC: regular rhythm, normal S1 and S2, no murmurs, rubs, gallops. Radial and DP pulses 2+ bilaterally. RESPIRATORY:  Clear to auscultation without rales, wheezing or rhonchi  ABDOMEN: Soft, non-tender, non-distended MUSCULOSKELETAL:  No edema; No deformity  SKIN: Warm and dry NEUROLOGIC:  Alert and oriented x 3 PSYCHIATRIC:  Normal affect   ASSESSMENT:    1. Paroxysmal atrial fibrillation (H[facility])   2. Atrial flutter, unspecified type ([place])   3. Essential hypertension    PLAN:    Paroxysmal Atrial flutter (initially typical, then atypical), also with paroxysmal atrial fibrillation during hospitalization 07/2018: -new onset in the setting of URI. Normal echo.  -has not required PRN metoprolol or furosemide since shortly after discharge. Will remove from med list -we discussed stopping diltiazem vs. Continuing, risk/benefit. Will keep for now as he is tolerating, no tachycardia -we again discussed risk/benefit of apixaban. With recent bleeding and chadsvasc=1, favor not using at this time. However, his CV will be 2 in December. He also has  a family history of stroke. We will need to rediscuss at follow up.  Hypertension: well controlled -continue diltiazem and chlorthalidone.  Plan for follow up: 6 mos or sooner PRN  Medication Adjustments/Labs and Tests Ordered: Current medicines are reviewed at length with the patient today.  Concerns regarding medicines are outlined above.  No orders of the defined types were placed in this encounter.  No orders of the defined types were placed in this encounter.   Patient Instructions  Medication Instructions:  Your Physician recommend you continue on your current medication as directed.    If you need a refill on your cardiac medications before your next appointment, please call your pharmacy.   Lab work: None  Testing/Procedures: None  Follow-Up: At [facility], you and your health needs are our priority.  As part of our continuing mission to provide you with exceptional heart care, we have created designated Provider Care Teams.  These Care Teams include your primary Cardiologist (physician) and Advanced Practice Providers (APPs -  Physician Assistants and Nurse Practitioners) who all work together to provide you with the care you need, when you need it. You will need a follow up appointment in 6 months.  Please call our office 2 months in advance to schedule this appointment.  You may see Buford Dresser, MD or one of the following Advanced Practice Providers on your designated Care Team:   Rosaria Ferries, PA-C . Jory Sims, DNP, ANP     Signed, Buford Dresser, MD PhD 02/13/2019 8:46 AM    [place]

## 2019-02-13 NOTE — Patient Instructions (Signed)

## 2019-02-14 ENCOUNTER — Other Ambulatory Visit: Payer: Self-pay | Admitting: Cardiology

## 2019-02-14 DIAGNOSIS — I4892 Unspecified atrial flutter: Secondary | ICD-10-CM

## 2019-02-14 DIAGNOSIS — I480 Paroxysmal atrial fibrillation: Secondary | ICD-10-CM

## 2019-02-14 MED ORDER — DILTIAZEM HCL ER COATED BEADS 360 MG PO CP24: 360.0000 mg | ORAL_CAPSULE | Freq: Every day | ORAL | 6 refills | Status: DC

## 2019-06-21 ENCOUNTER — Encounter: Payer: Self-pay | Admitting: Cardiology

## 2019-06-21 ENCOUNTER — Other Ambulatory Visit: Payer: Self-pay

## 2019-06-21 ENCOUNTER — Ambulatory Visit (INDEPENDENT_AMBULATORY_CARE_PROVIDER_SITE_OTHER): Payer: Medicare Other | Admitting: Cardiology

## 2019-06-21 VITALS — BP 124/75 | HR 61 | Ht 70.0 in | Wt 232.0 lb

## 2019-06-21 DIAGNOSIS — I10 Essential (primary) hypertension: Secondary | ICD-10-CM | POA: Diagnosis not present

## 2019-06-21 DIAGNOSIS — Z8679 Personal history of other diseases of the circulatory system: Secondary | ICD-10-CM

## 2019-06-21 DIAGNOSIS — R600 Localized edema: Secondary | ICD-10-CM | POA: Diagnosis not present

## 2019-06-21 MED ORDER — MONTELUKAST SODIUM 10 MG PO TABS: 10.0000 mg | ORAL_TABLET | Freq: Every day | ORAL | 3 refills | Status: DC

## 2019-06-21 NOTE — Progress Notes (Signed)
Cardiology Office Note:    Date:  06/21/2019   ID:  Charles Cameron, DOB 09-17-1945, MRN [id number]  PCP:  Lauraine Rinne, MD  Cardiologist:  Buford Dresser, MD PhD  Referring MD: Thornton Dales I, MD   CC: follow up  History of Present Illness:    Charles Cameron is a 73 y.o. male with a hx of pneumonia 06/2018-07/2018 with atrial flutter with paroxysmal atrial fibrillation diagnosed at the same time. He is seen for follow up today.  Cardiac history: No prior history until 06/2018, when he developed URI and atrial flutter/atrial fibrillation. He had intermittent RVR while in the hospital, requiring multiple medications for management. He was initially on dronedarone (stopped after 30 days), diltiazem, and metoprolol. Had volume overload with arrhythmia, improved with lasix. EF was normal.  Today: Doing well overall. Not taking BP at home frequently, but when he does take it is in the 120s/70s, HR has been in the 50s. No palpitations or abnormal rhythms on his BP cuff. Tolerating diltiazem and chlorthalidone with only mild LE edema.   He just had a birthday and turned 36. We discussed the chadsvasc score and how this now gives him an additional point, placing him at a score of 2. Discussed how this theoretically affects his risk of stroke. We discussed this at length, as well as pros/cons of anticoagulation. Given that he has not had recent recurrence, initial event related to respiratory infection, and he had hematuria on anticoagulation, through shared decision making we decided to hold on starting prophylactic anticoagulation. If he has recurrence then we would restart. Discussed Kardia Mobile device as an optional way to track rhythm.  Denies chest pain, shortness of breath at rest or with normal exertion. No PND, orthopnea, LE edema or unexpected weight gain. No syncope or palpitations.  Past Medical History:  Diagnosis Date  . Anticoagulation adequate 07/14/2018  .  Cancer ([place])   . Edema 07/14/2018  . Hypertension   . Prostate cancer ([place]) 2016  . URI (upper respiratory infection) 07/14/2018    Past Surgical History:  Procedure Laterality Date  . LUMBAR LAMINECTOMY  1989    Current Medications: Current Outpatient Medications on File Prior to Visit  Medication Sig  . chlorthalidone (HYGROTON) 25 MG tablet Take 0.5 tablets (12.5 mg total) by mouth daily.  Marland Kitchen diltiazem (CARDIZEM CD) 360 MG 24 hr capsule Take 1 capsule (360 mg total) by mouth daily.  Marland Kitchen levocetirizine (XYZAL) 5 MG tablet Take 5 mg by mouth every evening.   No current facility-administered medications on file prior to visit.      Allergies:   Patient has no known allergies.   Social History   Tobacco Use  . Smoking status: Former Smoker    Packs/day: 2.00    Years: 27.00    Pack years: 54.00    Types: Cigarettes    Quit date: 07/14/1983    Years since quitting: 35.9  . Smokeless tobacco: Never Used  Substance Use Topics  . Alcohol use: Yes    Comment: light use; rarely  . Drug use: No    Family History: The patient's family history includes Colon cancer in an other family member; Diabetes in his paternal grandfather; Heart attack in his mother; Hypertension in his father and mother; Prostate cancer in his father.  ROS:   Please see the history of present illness.  Additional pertinent ROS: Constitutional: Negative for chills, fever, night sweats, unintentional weight loss  HENT: Negative for ear pain  and hearing loss.   Eyes: Negative for loss of vision and eye pain.  Respiratory: Negative for cough, sputum, wheezing.   Cardiovascular: See HPI. Gastrointestinal: Negative for abdominal pain, melena, and hematochezia.  Genitourinary: Negative for dysuria and hematuria.  Musculoskeletal: Negative for falls and myalgias.  Skin: Negative for itching and rash.  Neurological: Negative for focal weakness, focal sensory changes and loss of consciousness.  Endo/Heme/Allergies:  Does bruise/bleed easily.   EKGs/Labs/Other Studies Reviewed:    The following studies were reviewed today: Echo 07/11/18 - Left ventricle: The cavity size was normal. Wall thickness was   increased in a pattern of mild LVH. Systolic function was normal.   The estimated ejection fraction was in the range of 55% to 60%.   Wall motion was normal; there were no regional wall motion   abnormalities. There was no evidence of elevated ventricular   filling pressure by Doppler parameters. - Aortic valve: Trileaflet; moderately thickened, moderately   calcified leaflets. Valve mobility was restricted. There was mild   regurgitation. - Mitral valve: There was moderate regurgitation. - Right ventricle: The cavity size was mildly dilated. Wall   thickness was normal. - Right atrium: The atrium was mildly dilated.  EKG:  EKG is personally reviewed.  The ekg ordered 08/15/18 demonstrates sinus bradycardia at 56 bpm with RBBB  Recent Labs: 07/10/2018: B Natriuretic Peptide 317.2 07/12/2018: Magnesium 2.1 07/14/2018: ALT 49; TSH 1.914 10/30/2018: BUN 22; Creatinine, Ser 0.98; Potassium 3.2; Sodium 141 11/04/2018: Hemoglobin 14.8; Platelets 183  Recent Lipid Panel No results found for: CHOL, TRIG, HDL, CHOLHDL, VLDL, LDLCALC, LDLDIRECT  Physical Exam:    VS:  BP 124/75   Pulse 61   Ht 5\' 10"  (1.778 m)   Wt 232 lb (105.2 kg)   SpO2 98%   BMI 33.29 kg/m     Wt Readings from Last 3 Encounters:  06/21/19 232 lb (105.2 kg)  02/13/19 224 lb (101.6 kg)  11/15/18 233 lb (105.7 kg)    GEN: Well nourished, well developed in no acute distress HEENT: Normal, moist mucous membranes NECK: No JVD CARDIAC: regular rhythm, normal S1 and S2, no rubs or gallops. No murmur. VASCULAR: Radial and DP pulses 2+ bilaterally. No carotid bruits RESPIRATORY:  Clear to auscultation without rales, wheezing or rhonchi  ABDOMEN: Soft, non-tender, non-distended MUSCULOSKELETAL:  Ambulates independently SKIN: Warm and  dry, trace bilateral LE edema NEUROLOGIC:  Alert and oriented x 3. No focal neuro deficits noted. PSYCHIATRIC:  Normal affect   ASSESSMENT:    1. History of atrial fibrillation   2. History of atrial flutter   3. Essential hypertension   4. Bilateral leg edema    PLAN:    History of paroxysmal Atrial flutter (initially typical, then atypical), also with paroxysmal atrial fibrillation during hospitalization 07/2018: -new onset in the setting of pneumonia/bronchitis. Normal echo.  -discussed that now that he is 73, his chadsvasc is 2. However, he has had no further documented arrhythmias since recovering fully from his respiratory infection. He also had hematuria on anticoagulation. We discussed risk/benefit, shared decision making, and elect not to restart anticoagulation at this time -if he has another documented episode, then restart anticoagulation -discussed [place] for PRN monitoring  Hypertension: well controlled -continue diltiazem and chlorthalidone. -does have trace bilateral LE edema. May be due to diltiazem. Unchanged, mild. Continue to monitor.  Non-cardiac: -asking for refill on singulair as he no longer has a provider that fills it. Refilled today -asking about utility of Covid antibody  testing. Discussed today  Plan for follow up: 1 year or sooner PRN  TIME SPENT WITH PATIENT: 27 minutes of direct patient care. More than 50% of that time was spent on coordination of care and counseling regarding shared decision making re: anticoagulation, monitoring for arrhythmia.  Buford Dresser, MD, PhD [place]  [facility]   Medication Adjustments/Labs and Tests Ordered: Current medicines are reviewed at length with the patient today.  Concerns regarding medicines are outlined above.  No orders of the defined types were placed in this encounter.  Meds ordered this encounter  Medications  . montelukast (SINGULAIR) 10 MG tablet    Sig: Take 1 tablet (10 mg  total) by mouth at bedtime.    Dispense:  90 tablet    Refill:  3    Patient Instructions  Medication Instructions:  Your Physician recommend you continue on your current medication as directed.    *If you need a refill on your cardiac medications before your next appointment, please call your pharmacy*  Lab Work: None  Testing/Procedures: None  Follow-Up: At [facility], you and your health needs are our priority.  As part of our continuing mission to provide you with exceptional heart care, we have created designated Provider Care Teams.  These Care Teams include your primary Cardiologist (physician) and Advanced Practice Providers (APPs -  Physician Assistants and Nurse Practitioners) who all work together to provide you with the care you need, when you need it.  Your next appointment:   1 year(s)  The format for your next appointment:   In Person  Provider:   Buford Dresser, MD  Consider [place] for monitoring rhythm.  Signed, Buford Dresser, MD PhD 06/21/2019 12:56 PM    [place]

## 2019-06-21 NOTE — Patient Instructions (Addendum)
Medication Instructions:  Your Physician recommend you continue on your current medication as directed.    *If you need a refill on your cardiac medications before your next appointment, please call your pharmacy*  Lab Work: None  Testing/Procedures: None  Follow-Up: At [facility], you and your health needs are our priority.  As part of our continuing mission to provide you with exceptional heart care, we have created designated Provider Care Teams.  These Care Teams include your primary Cardiologist (physician) and Advanced Practice Providers (APPs -  Physician Assistants and Nurse Practitioners) who all work together to provide you with the care you need, when you need it.  Your next appointment:   1 year(s)  The format for your next appointment:   In Person  Provider:   Buford Dresser, MD  Consider [place] for monitoring rhythm.

## 2019-09-20 ENCOUNTER — Other Ambulatory Visit: Payer: Self-pay | Admitting: Cardiology

## 2020-04-01 ENCOUNTER — Telehealth: Payer: Self-pay | Admitting: Cardiology

## 2020-04-01 NOTE — Telephone Encounter (Signed)
Returned call to patient of Dr. Harrell Gave. He reports his BP monitor indicated he is out of rhythm. He reports the BP monitor indicated this over the last week, a couple of times. He reports "fluttering". Denies chest pain & shortness of breath. Confirmed med list is up to take & he is compliant.   He has an appointment with MD tomorrow. No further assistance needed at this time.   Recent vitals: 133/80 - HR 64 116/72 - HR 62 120/89 - HR 74

## 2020-04-01 NOTE — Telephone Encounter (Signed)
Karee requested an appointment per pt schedule with Dr. Harrell Gave stating "Heart out of rhythm again recently. Any day/time in the selected timeframe 9/20-10/1." An appt with Dr. Harrell Gave has been scheduled for tomorrow at 2:20 PM due to this. Please advise.

## 2020-04-02 ENCOUNTER — Ambulatory Visit (INDEPENDENT_AMBULATORY_CARE_PROVIDER_SITE_OTHER): Payer: Medicare Other | Admitting: Cardiology

## 2020-04-02 ENCOUNTER — Encounter: Payer: Self-pay | Admitting: Cardiology

## 2020-04-02 ENCOUNTER — Other Ambulatory Visit: Payer: Self-pay

## 2020-04-02 VITALS — BP 125/75 | HR 58 | Temp 97.0°F | Ht 70.0 in | Wt 222.6 lb

## 2020-04-02 DIAGNOSIS — R319 Hematuria, unspecified: Secondary | ICD-10-CM | POA: Diagnosis not present

## 2020-04-02 DIAGNOSIS — I480 Paroxysmal atrial fibrillation: Secondary | ICD-10-CM | POA: Diagnosis not present

## 2020-04-02 DIAGNOSIS — Z8679 Personal history of other diseases of the circulatory system: Secondary | ICD-10-CM

## 2020-04-02 DIAGNOSIS — I10 Essential (primary) hypertension: Secondary | ICD-10-CM

## 2020-04-02 NOTE — Patient Instructions (Addendum)
Medication Instructions:  Stop Chlorthalidone 25 mg daily  *If you need a refill on your cardiac medications before your next appointment, please call your pharmacy*   Lab Work: None ordered   Testing/Procedures: None ordered    Follow-Up: At [facility], you and your health needs are our priority.  As part of our continuing mission to provide you with exceptional heart care, we have created designated Provider Care Teams.  These Care Teams include your primary Cardiologist (physician) and Advanced Practice Providers (APPs -  Physician Assistants and Nurse Practitioners) who all work together to provide you with the care you need, when you need it.  We recommend signing up for the patient portal called "MyChart".  Sign up information is provided on this After Visit Summary.  MyChart is used to connect with patients for Virtual Visits (Telemedicine).  Patients are able to view lab/test results, encounter notes, upcoming appointments, etc.  Non-urgent messages can be sent to your provider as well.   To learn more about what you can do with MyChart, go to [email or web address].    Your next appointment:   1 year(s)  The format for your next appointment:   In Person  Provider:   Buford Dresser, MD  Consider KardiaMobile to monitor heart rhythm periodically. Can send me the strips via mychart.   Kardia Mobile [employer]: Website: [email or web address]  DR. Harrell Gave RECOMMENDS YOU PURCHASE  " Kardia" By [employer]  [employer] FROM THE  [employer]OOGLE[employer]  APP PLAY STORE.  THE APP IS FREE , BUT THE  EQUIPMENT HAS A COST. IT ALLOWS YOU TO OBTAIN A RECORDING OF YOUR HEART RATE AND RHYTHM BY PROVIDING A SHORT STRIP THAT YOU CAN SHARE WITH YOUR PROVIDER.

## 2020-04-02 NOTE — Progress Notes (Signed)
Cardiology Office Note:    Date:  04/02/2020   ID:  NISHAN OVENS, DOB [birth year], MRN [id number]  PCP:  Lauraine Rinne, MD  Cardiologist:  Buford Dresser, MD PhD  Referring MD: Thornton Dales I, MD   CC: follow up  History of Present Illness:    Charles Cameron is a 74 y.o. male with a hx of pneumonia 06/2018-07/2018 with atrial flutter with paroxysmal atrial fibrillation diagnosed at the same time. He is seen for follow up today.  Cardiac history: No prior history until 06/2018, when he developed URI and atrial flutter/atrial fibrillation. He had intermittent RVR while in the hospital, requiring multiple medications for management. He was initially on dronedarone (stopped after 30 days), diltiazem, and metoprolol. Had volume overload with arrhythmia, improved with lasix. EF was normal.  Today: BP cuff has noted several instances in the last week or so that he may be out of rhythm.  Almost out of chlorthalidone. Wants to see if he can come off, will trial for a week. BP has been well controlled at home. Currently taking 12.5 mg daily.  Denies chest pain, shortness of breath at rest or with normal exertion. No PND, orthopnea, LE edema or unexpected weight gain. No syncope or palpitations.  Exercising 3-4 times/week at the gym, no issues.   Past Medical History:  Diagnosis Date  . Anticoagulation adequate 07/14/2018  . Cancer ([place])   . Edema 07/14/2018  . Hypertension   . Prostate cancer ([place]) 2016  . URI (upper respiratory infection) 07/14/2018    Past Surgical History:  Procedure Laterality Date  . LUMBAR LAMINECTOMY  1989    Current Medications: Current Outpatient Medications on File Prior to Visit  Medication Sig  . chlorthalidone (HYGROTON) 25 MG tablet TAKE 1/2 TABLET BY MOUTH EVERY DAY  . diltiazem (CARDIZEM CD) 360 MG 24 hr capsule Take 1 capsule (360 mg total) by mouth daily.  Marland Kitchen levocetirizine (XYZAL) 5 MG tablet Take 5 mg by mouth every evening.  .  montelukast (SINGULAIR) 10 MG tablet Take 1 tablet (10 mg total) by mouth at bedtime.   No current facility-administered medications on file prior to visit.     Allergies:   Patient has no known allergies.   Social History   Tobacco Use  . Smoking status: Former Smoker    Packs/day: 2.00    Years: 27.00    Pack years: 54.00    Types: Cigarettes    Quit date: 07/14/1983    Years since quitting: 36.7  . Smokeless tobacco: Never Used  Substance Use Topics  . Alcohol use: Yes    Comment: light use; rarely  . Drug use: No    Family History: The patient's family history includes Colon cancer in an other family member; Diabetes in his paternal grandfather; Heart attack in his mother; Hypertension in his father and mother; Prostate cancer in his father.  ROS:   Please see the history of present illness.  Additional pertinent ROS otherwise unremarkable  EKGs/Labs/Other Studies Reviewed:    The following studies were reviewed today: Echo 07/11/18 - Left ventricle: The cavity size was normal. Wall thickness was   increased in a pattern of mild LVH. Systolic function was normal.   The estimated ejection fraction was in the range of 55% to 60%.   Wall motion was normal; there were no regional wall motion   abnormalities. There was no evidence of elevated ventricular   filling pressure by Doppler parameters. - Aortic valve: Trileaflet;  moderately thickened, moderately   calcified leaflets. Valve mobility was restricted. There was mild   regurgitation. - Mitral valve: There was moderate regurgitation. - Right ventricle: The cavity size was mildly dilated. Wall   thickness was normal. - Right atrium: The atrium was mildly dilated.  EKG:  EKG is personally reviewed.  The ekg ordered today demonstrates sinus bradycardia at 58 bpm with RBBB  Recent Labs: No results found for requested labs within last 8760 hours.  Recent Lipid Panel No results found for: CHOL, TRIG, HDL, CHOLHDL,  VLDL, LDLCALC, LDLDIRECT  Physical Exam:    VS:  BP 125/75   Pulse (!) 58   Temp (!) 97 F (36.1 C)   Ht 5\' 10"  (1.778 m)   Wt 222 lb 9.6 oz (101 kg)   SpO2 96%   BMI 31.94 kg/m     Wt Readings from Last 3 Encounters:  04/02/20 222 lb 9.6 oz (101 kg)  06/21/19 232 lb (105.2 kg)  02/13/19 224 lb (101.6 kg)    GEN: Well nourished, well developed in no acute distress HEENT: Normal, moist mucous membranes NECK: No JVD CARDIAC: regular rhythm, normal S1 and S2, no rubs or gallops. No murmur. VASCULAR: Radial and DP pulses 2+ bilaterally. No carotid bruits RESPIRATORY:  Clear to auscultation without rales, wheezing or rhonchi  ABDOMEN: Soft, non-tender, non-distended MUSCULOSKELETAL:  Ambulates independently SKIN: Warm and dry, no edema NEUROLOGIC:  Alert and oriented x 3. No focal neuro deficits noted. PSYCHIATRIC:  Normal affect   ASSESSMENT:    No diagnosis found. PLAN:    History of paroxysmal Atrial flutter (initially typical, then atypical), also with paroxysmal atrial fibrillation during hospitalization 07/2018: -new onset in the setting of pneumonia/bronchitis. Normal echo.  -discussed that now that he is 73, his chadsvasc is 2. However, he has had no further documented arrhythmias since recovering fully from his respiratory infection. He also had hematuria on anticoagulation. We discussed risk/benefit, shared decision making, and elect not to restart anticoagulation at this time  -if he has prolonged/documented episode of arrhythmia, will need to re-discuss anticoagulation -discussed [place] for PRN monitoring  Hypertension: well controlled -continue diltiazem -wishes to trial coming off chlorthalidone. He will monitor his blood pressure and LE edema and contact me with issues  CV risk counseling and prevention -recommend heart healthy/Mediterranean diet, with whole grains, fruits, vegetable, fish, lean meats, nuts, and olive oil. Limit salt. -recommend  moderate walking, 3-5 times/week for 30-50 minutes each session. Aim for at least 150 minutes.week. Goal should be pace of 3 miles/hours, or walking 1.5 miles in 30 minutes -recommend avoidance of tobacco products. Avoid excess alcohol. -ASCVD risk score: The 10-year ASCVD risk score Mikey Bussing DC Brooke Bonito., et al., 2013) is: 31%   Values used to calculate the score:     Age: 27 years     Sex: Male     Is Non-Hispanic African American: No     Diabetic: No     Tobacco smoker: No     Systolic Blood Pressure: 096 mmHg     Is BP treated: Yes     HDL Cholesterol: 48 MG/DL     Total Cholesterol: 213 MG/DL  -given elevated ASCVD risk, have discussed statin.  Plan for follow up: 1 year or sooner PRN  Buford Dresser, MD, PhD [place]  [facility]   Medication Adjustments/Labs and Tests Ordered: Current medicines are reviewed at length with the patient today.  Concerns regarding medicines are outlined above.  Orders Placed  This Encounter  Procedures  . EKG 12-Lead   No orders of the defined types were placed in this encounter.   Patient Instructions  Medication Instructions:  Stop Chlorthalidone 25 mg daily  *If you need a refill on your cardiac medications before your next appointment, please call your pharmacy*   Lab Work: None ordered   Testing/Procedures: None ordered    Follow-Up: At [facility], you and your health needs are our priority.  As part of our continuing mission to provide you with exceptional heart care, we have created designated Provider Care Teams.  These Care Teams include your primary Cardiologist (physician) and Advanced Practice Providers (APPs -  Physician Assistants and Nurse Practitioners) who all work together to provide you with the care you need, when you need it.  We recommend signing up for the patient portal called "MyChart".  Sign up information is provided on this After Visit Summary.  MyChart is used to connect with patients for  Virtual Visits (Telemedicine).  Patients are able to view lab/test results, encounter notes, upcoming appointments, etc.  Non-urgent messages can be sent to your provider as well.   To learn more about what you can do with MyChart, go to [email or web address].    Your next appointment:   1 year(s)  The format for your next appointment:   In Person  Provider:   Buford Dresser, MD  Consider KardiaMobile to monitor heart rhythm periodically. Can send me the strips via mychart.   Kardia Mobile [employer]: Website: [email or web address]  DR. Harrell Gave RECOMMENDS YOU PURCHASE  " Kardia" By [employer]  [employer] FROM THE  [employer]  APP PLAY STORE.  THE APP IS FREE , BUT THE  EQUIPMENT HAS A COST. IT ALLOWS YOU TO OBTAIN A RECORDING OF YOUR HEART RATE AND RHYTHM BY PROVIDING A SHORT STRIP THAT YOU CAN SHARE WITH YOUR PROVIDER.    Signed, Buford Dresser, MD PhD 04/02/2020    [place]

## 2020-04-08 ENCOUNTER — Other Ambulatory Visit: Payer: Self-pay | Admitting: Cardiology

## 2020-04-08 DIAGNOSIS — I4892 Unspecified atrial flutter: Secondary | ICD-10-CM

## 2020-04-08 DIAGNOSIS — I480 Paroxysmal atrial fibrillation: Secondary | ICD-10-CM

## 2020-05-19 ENCOUNTER — Encounter: Payer: Self-pay | Admitting: Cardiology

## 2020-07-15 ENCOUNTER — Telehealth: Payer: Self-pay | Admitting: Cardiology

## 2020-07-15 NOTE — Telephone Encounter (Signed)
Pt c/o medication issue:  1. Name of Medication: diltiazem (CARDIZEM CD) 360 MG 24 hr capsule  2. How are you currently taking this medication (dosage and times per day)? As directed  3. Are you having a reaction (difficulty breathing--STAT)? No   4. What is your medication issue? Patient states his local pharmacy informed him that he can get the non-CD version of the medication for only $12 and he would like to know if he is eligible. Please return call to discuss.

## 2020-07-15 NOTE — Telephone Encounter (Signed)
Spoke with patient, pharmacy is [facility].  Diltiazem CD 360 not covered, but [place] is cheaper.  Pharmacy will work with patent to determine which formulation will be the best price and let us know

## 2020-07-17 NOTE — Telephone Encounter (Signed)
Noted, thanks!

## 2020-07-17 NOTE — Telephone Encounter (Signed)
Will route to MD to make aware.   Thanks! 

## 2020-07-17 NOTE — Telephone Encounter (Signed)
° ° °  Macey with [facility] called to let us know they found alternative for diltiazem. She said they will refill Tiadylt ER 360 mg with same formulation as diltiazem CD 360. She said if Dr. Cristal Deer have any questions just give them a call

## 2021-01-06 ENCOUNTER — Encounter (HOSPITAL_BASED_OUTPATIENT_CLINIC_OR_DEPARTMENT_OTHER): Payer: Self-pay

## 2021-01-09 ENCOUNTER — Encounter (HOSPITAL_BASED_OUTPATIENT_CLINIC_OR_DEPARTMENT_OTHER): Payer: Self-pay

## 2021-01-15 ENCOUNTER — Ambulatory Visit (HOSPITAL_COMMUNITY)
Admission: RE | Admit: 2021-01-15 | Discharge: 2021-01-15 | Disposition: A | Discharge status: Home / Self Care | Payer: Medicare Other | Source: Ambulatory Visit | Attending: Nurse Practitioner | Admitting: Nurse Practitioner

## 2021-01-15 ENCOUNTER — Other Ambulatory Visit: Payer: Self-pay

## 2021-01-15 ENCOUNTER — Encounter (HOSPITAL_COMMUNITY): Payer: Self-pay | Admitting: Nurse Practitioner

## 2021-01-15 VITALS — BP 126/74 | HR 92 | Ht 70.0 in | Wt 236.8 lb

## 2021-01-15 DIAGNOSIS — Z87891 Personal history of nicotine dependence: Secondary | ICD-10-CM | POA: Insufficient documentation

## 2021-01-15 DIAGNOSIS — I480 Paroxysmal atrial fibrillation: Secondary | ICD-10-CM | POA: Diagnosis not present

## 2021-01-15 DIAGNOSIS — Z7901 Long term (current) use of anticoagulants: Secondary | ICD-10-CM | POA: Diagnosis not present

## 2021-01-15 DIAGNOSIS — I4510 Unspecified right bundle-branch block: Secondary | ICD-10-CM | POA: Diagnosis not present

## 2021-01-15 DIAGNOSIS — Z7289 Other problems related to lifestyle: Secondary | ICD-10-CM | POA: Diagnosis not present

## 2021-01-15 DIAGNOSIS — Z79899 Other long term (current) drug therapy: Secondary | ICD-10-CM | POA: Insufficient documentation

## 2021-01-15 DIAGNOSIS — I10 Essential (primary) hypertension: Secondary | ICD-10-CM | POA: Diagnosis not present

## 2021-01-15 DIAGNOSIS — I4891 Unspecified atrial fibrillation: Secondary | ICD-10-CM | POA: Insufficient documentation

## 2021-01-15 DIAGNOSIS — R600 Localized edema: Secondary | ICD-10-CM | POA: Diagnosis not present

## 2021-01-15 DIAGNOSIS — D6869 Other thrombophilia: Secondary | ICD-10-CM | POA: Diagnosis not present

## 2021-01-15 MED ORDER — APIXABAN 5 MG PO TABS: 5.0000 mg | ORAL_TABLET | Freq: Two times a day (BID) | ORAL | 3 refills | Status: DC

## 2021-01-15 MED ORDER — FUROSEMIDE 20 MG PO TABS: ORAL_TABLET | ORAL | 1 refills | Status: DC

## 2021-01-15 MED ORDER — POTASSIUM CHLORIDE CRYS ER 20 MEQ PO TBCR: EXTENDED_RELEASE_TABLET | ORAL | 1 refills | Status: DC

## 2021-01-15 NOTE — Patient Instructions (Signed)
Stop aspirin  Start Eliquis 5mg  twice a day   Lasix 20mg  -- take 1 tablet daily for the next 3 days then only as needed for weight gain/ swelling   Potassium 14meq -- take 1 tablet daily for the next 3 days then only as needed when lasix is taken

## 2021-01-15 NOTE — Progress Notes (Signed)
Primary Care Physician: Lauraine Rinne, MD Referring Physician: Dr. Mervin Kung Charles Cameron is a 75 y.o. male with a h/o afb in the setting of pneumonia 07/2019. He converted to SR in the hospital. He continued on diltiazem but eliquis was stopped a few months later as he had a CHA2DS2VASc  score of 1. He returned to afib, rate controlled 6/30. His CHA2DS2VASc  score is now 3. He restarted an asa last week but will now stop this as he will need to go back on eliquis. Ekg shows afib today with a v rate of 92 bpm.   In the clinic, his weight is up 6 lbs from when he had his last weight in Epic and has seen more pedal edema for the last few days. No issues with PND/orthopnea. He does exercise 3 x a week and goes to yoga 2x a week. He feels he could do better with his diet. He does drink an alcoholic beverage a night. No tobacco. Denies any snoring issues.   Today, he denies symptoms of palpitations, chest pain, shortness of breath, orthopnea, PND, lower extremity edema, dizziness, presyncope, syncope, or neurologic sequela. The patient is tolerating medications without difficulties and is otherwise without complaint today.   Past Medical History:  Diagnosis Date   Anticoagulation adequate 07/14/2018   Cancer ([place])    Edema 07/14/2018   Hypertension    Prostate cancer ([place]) 2016   URI (upper respiratory infection) 07/14/2018   Past Surgical History:  Procedure Laterality Date   LUMBAR LAMINECTOMY  1989    Current Outpatient Medications  Medication Sig Dispense Refill   diltiazem (CARDIZEM CD) 360 MG 24 hr capsule TAKE 1 CAPSULE BY MOUTH EVERY DAY 90 capsule 3   levocetirizine (XYZAL) 5 MG tablet Take 5 mg by mouth every evening.     montelukast (SINGULAIR) 10 MG tablet Take 1 tablet (10 mg total) by mouth at bedtime. 90 tablet 3   No current facility-administered medications for this encounter.    No Known Allergies  Social History   Socioeconomic History   Marital status:  Married    Spouse name: [name]   Number of children: 5   Years of education: 16   Highest education level: Not on file  Occupational History   Occupation: retired  Tobacco Use   Smoking status: Former    Packs/day: 2.00    Years: 27.00    Pack years: 54.00    Types: Cigarettes    Quit date: 07/14/1983    Years since quitting: 37.5   Smokeless tobacco: Never  Substance and Sexual Activity   Alcohol use: Yes    Comment: light use; rarely   Drug use: No   Sexual activity: Not on file  Other Topics Concern   Not on file  Social History Narrative   Patient lives at home with spouse.   Caffeine Use: 2 cups daily   Social Determinants of Health   Financial Resource Strain: Not on file  Food Insecurity: Not on file  Transportation Needs: Not on file  Physical Activity: Not on file  Stress: Not on file  Social Connections: Not on file  Intimate Partner Violence: Not on file    Family History  Problem Relation Age of Onset   Heart attack Mother    Hypertension Mother    Prostate cancer Father    Hypertension Father    Colon cancer Other    Diabetes Paternal Grandfather     ROS-  All systems are reviewed and negative except as per the HPI above  Physical Exam: There were no vitals filed for this visit. Wt Readings from Last 3 Encounters:  04/02/20 101 kg  06/21/19 105.2 kg  02/13/19 101.6 kg    Labs: Lab Results  Component Value Date   NA 141 10/30/2018   K 3.2 (L) 10/30/2018   CL 110 10/30/2018   CO2 25 10/30/2018   GLUCOSE 112 (H) 10/30/2018   BUN 22 10/30/2018   CREATININE 0.98 10/30/2018   CALCIUM 9.0 10/30/2018   MG 2.1 07/12/2018   Lab Results  Component Value Date   INR 1.43 07/11/2018   No results found for: CHOL, HDL, LDLCALC, TRIG   GEN- The patient is well appearing, alert and oriented x 3 today.   Head- normocephalic, atraumatic Eyes-  Sclera clear, conjunctiva pink Ears- hearing intact Oropharynx- clear Neck- supple, no JVP Lymph- no  cervical lymphadenopathy Lungs- Clear to ausculation bilaterally, normal work of breathing Heart- irregular rate and rhythm, no murmurs, rubs or gallops, PMI not laterally displaced GI- soft, NT, ND, + BS Extremities- no clubbing, cyanosis, trace bilateral edema MS- no significant deformity or atrophy Skin- no rash or lesion Psych- euthymic mood, full affect Neuro- strength and sensation are intact  EKG-afib at 92 bpm, RBBB, qrs int 138 ms, qtc 484 ms  Echo 2019-Left ventricle: The cavity size was normal. Wall thickness was    increased in a pattern of mild LVH. Systolic function was normal.    The estimated ejection fraction was in the range of 55% to 60%.    Wall motion was normal; there were no regional wall motion    abnormalities. There was no evidence of elevated ventricular    filling pressure by Doppler parameters.  - Aortic valve: Trileaflet; moderately thickened, moderately    calcified leaflets. Valve mobility was restricted. There was mild    regurgitation.  - Mitral valve: There was moderate regurgitation.  - Right ventricle: The cavity size was mildly dilated. Wall    thickness was normal.  - Right atrium: The atrium was mildly dilated.   Epic records reviewed   Assessment and Plan:  1. Afib  Has reoccurred from initial onset with pneumonia 07/2018 No current trigger noted  He is rate controlled  Continue diltiazem at 360 mg daily  2. CHA2DS2VASc  score of 3  He will restart eliquis 5 mg qd I anticipate with this CHA2DS2VASc  score he will need long term  Stop asa  Bleeding precautions discussed   3. HTN Stable  4.Lifestyle issues He was advised to stop alcohol use  He already has a regular exercise but encouraged to modify diet for weight loss   He denies snoring  5. Mild weight gain/pedal edema  I will rx lasix 20 mg daily with 20 meq k+ x 3 days for current LLE and then as needed for 3-5 lbs of weight in 1-2 days He will weigh daily He currently  denies any PND/orthopnea   I will see back in 1-2 weeks, I anticipate that he may need cardioversion in 21 days,  if he does not convert   Butch Penny C. Chauncey Sciulli, [facility] [address] [place], [place] [address] [phone]

## 2021-01-28 ENCOUNTER — Ambulatory Visit (HOSPITAL_COMMUNITY): Payer: Medicare Other | Admitting: Nurse Practitioner

## 2021-02-02 ENCOUNTER — Emergency Department (HOSPITAL_COMMUNITY): Payer: Medicare Other

## 2021-02-02 ENCOUNTER — Emergency Department (HOSPITAL_COMMUNITY)
Admission: EM | Admit: 2021-02-02 | Discharge: 2021-02-02 | Disposition: A | Discharge status: Home / Self Care | Payer: Medicare Other | Attending: Emergency Medicine | Admitting: Emergency Medicine

## 2021-02-02 ENCOUNTER — Encounter (HOSPITAL_COMMUNITY): Payer: Self-pay | Admitting: Emergency Medicine

## 2021-02-02 DIAGNOSIS — I10 Essential (primary) hypertension: Secondary | ICD-10-CM | POA: Insufficient documentation

## 2021-02-02 DIAGNOSIS — Z79899 Other long term (current) drug therapy: Secondary | ICD-10-CM | POA: Diagnosis not present

## 2021-02-02 DIAGNOSIS — Z7901 Long term (current) use of anticoagulants: Secondary | ICD-10-CM | POA: Insufficient documentation

## 2021-02-02 DIAGNOSIS — Z87891 Personal history of nicotine dependence: Secondary | ICD-10-CM | POA: Diagnosis not present

## 2021-02-02 DIAGNOSIS — R059 Cough, unspecified: Secondary | ICD-10-CM | POA: Insufficient documentation

## 2021-02-02 DIAGNOSIS — Z8546 Personal history of malignant neoplasm of prostate: Secondary | ICD-10-CM | POA: Diagnosis not present

## 2021-02-02 DIAGNOSIS — I4891 Unspecified atrial fibrillation: Secondary | ICD-10-CM | POA: Insufficient documentation

## 2021-02-02 LAB — CBC WITH DIFFERENTIAL/PLATELET
Abs Immature Granulocytes: 0.02 10*3/uL (ref 0.00–0.07)
Basophils Absolute: 0 10*3/uL (ref 0.0–0.1)
Basophils Relative: 1 %
Eosinophils Absolute: 0.3 10*3/uL (ref 0.0–0.5)
Eosinophils Relative: 4 %
HCT: 48.7 % (ref 39.0–52.0)
Hemoglobin: 16.9 g/dL (ref 13.0–17.0)
Immature Granulocytes: 0 %
Lymphocytes Relative: 26 %
Lymphs Abs: 2 10*3/uL (ref 0.7–4.0)
MCH: 32.8 pg (ref 26.0–34.0)
MCHC: 34.7 g/dL (ref 30.0–36.0)
MCV: 94.6 fL (ref 80.0–100.0)
Monocytes Absolute: 0.8 10*3/uL (ref 0.1–1.0)
Monocytes Relative: 10 %
Neutro Abs: 4.4 10*3/uL (ref 1.7–7.7)
Neutrophils Relative %: 59 %
Platelets: 190 10*3/uL (ref 150–400)
RBC: 5.15 MIL/uL (ref 4.22–5.81)
RDW: 12.5 % (ref 11.5–15.5)
WBC: 7.5 10*3/uL (ref 4.0–10.5)
nRBC: 0 % (ref 0.0–0.2)

## 2021-02-02 LAB — COMPREHENSIVE METABOLIC PANEL
ALT: 36 U/L (ref 0–44)
AST: 28 U/L (ref 15–41)
Albumin: 3.4 g/dL — ABNORMAL LOW (ref 3.5–5.0)
Alkaline Phosphatase: 116 U/L (ref 38–126)
Anion gap: 7 (ref 5–15)
BUN: 13 mg/dL (ref 8–23)
CO2: 24 mmol/L (ref 22–32)
Calcium: 9.4 mg/dL (ref 8.9–10.3)
Chloride: 108 mmol/L (ref 98–111)
Creatinine, Ser: 1.17 mg/dL (ref 0.61–1.24)
GFR, Estimated: >60 mL/min (ref >60)
Glucose, Bld: 151 mg/dL — ABNORMAL HIGH (ref 70–99)
Potassium: 3.7 mmol/L (ref 3.5–5.1)
Sodium: 139 mmol/L (ref 135–145)
Total Bilirubin: 0.7 mg/dL (ref 0.3–1.2)
Total Protein: 6.4 g/dL — ABNORMAL LOW (ref 6.5–8.1)

## 2021-02-02 LAB — MAGNESIUM: Magnesium: 2.2 mg/dL (ref 1.7–2.4)

## 2021-02-02 LAB — TROPONIN I (HIGH SENSITIVITY): Troponin I (High Sensitivity): 6 ng/L (ref <18)

## 2021-02-02 MED ORDER — SODIUM CHLORIDE 0.9 % IV SOLN: INTRAVENOUS | Status: DC | PRN

## 2021-02-02 MED ORDER — BEBTELOVIMAB 175 MG/2 ML IV (EUA): 175.0000 mg | Freq: Once | INTRAMUSCULAR | Status: DC

## 2021-02-02 MED ORDER — DILTIAZEM HCL 25 MG/5ML IV SOLN
20.0000 mg | Freq: Once | INTRAVENOUS | Status: AC
  Administered 2021-02-02: 20 mg via INTRAVENOUS
  Filled 2021-02-02: qty 5

## 2021-02-02 MED ORDER — DIPHENHYDRAMINE HCL 50 MG/ML IJ SOLN: 50.0000 mg | Freq: Once | INTRAMUSCULAR | Status: DC | PRN

## 2021-02-02 MED ORDER — FAMOTIDINE IN NACL 20-0.9 MG/50ML-% IV SOLN: 20.0000 mg | Freq: Once | INTRAVENOUS | Status: DC | PRN

## 2021-02-02 MED ORDER — EPINEPHRINE 0.3 MG/0.3ML IJ SOAJ: 0.3000 mg | Freq: Once | INTRAMUSCULAR | Status: DC | PRN

## 2021-02-02 MED ORDER — METHYLPREDNISOLONE SODIUM SUCC 125 MG IJ SOLR: 125.0000 mg | Freq: Once | INTRAMUSCULAR | Status: DC | PRN

## 2021-02-02 MED ORDER — ALBUTEROL SULFATE HFA 108 (90 BASE) MCG/ACT IN AERS: 2.0000 | INHALATION_SPRAY | Freq: Once | RESPIRATORY_TRACT | Status: DC | PRN

## 2021-02-02 NOTE — Discharge Instructions (Addendum)
Call your primary care doctor or specialist as discussed in the next 2-3 days.   Return immediately back to the ER if:  Your symptoms worsen within the next 12-24 hours. You develop new symptoms such as new fevers, persistent vomiting, new pain, shortness of breath, or new weakness or numbness, or if you have any other concerns.  

## 2021-02-02 NOTE — ED Provider Notes (Signed)
[facility] EMERGENCY DEPARTMENT Provider Note   CSN: [id number] Arrival date & time: 02/02/21  1031     History Chief Complaint  Patient presents with   Atrial Fibrillation    Charles Cameron is a 75 y.o. male.  Patient states that he is on day 7 of COVID symptoms.  Describes it as a mild cough.  Denies shortness of breath denies vomiting or diarrhea.  He states that he went to COVID infusion center and was told to go to the ER due to rapid heart rate.  He denies any headache or chest pain.  No fever no cough no vomiting no diarrhea.  He states he has a history of atrial flutter that is intermittent.  Continues on Eliquis at home.      Past Medical History:  Diagnosis Date   Anticoagulation adequate 07/14/2018   Cancer ([place])    Edema 07/14/2018   Hypertension    Prostate cancer ([place]) 2016   URI (upper respiratory infection) 07/14/2018    Patient Active Problem List   Diagnosis Date Noted   Essential hypertension 09/06/2018   Long term (current) use of anticoagulants 08/15/2018   Paroxysmal atrial fibrillation ([place]) 08/15/2018   Atrial flutter ([place]) 07/10/2018    Past Surgical History:  Procedure Laterality Date   LUMBAR LAMINECTOMY  1989       Family History  Problem Relation Age of Onset   Heart attack Mother    Hypertension Mother    Prostate cancer Father    Hypertension Father    Colon cancer Other    Diabetes Paternal Grandfather     Social History   Tobacco Use   Smoking status: Former    Packs/day: 2.00    Years: 27.00    Pack years: 54.00    Types: Cigarettes    Quit date: 07/14/1983    Years since quitting: 37.5   Smokeless tobacco: Never  Substance Use Topics   Alcohol use: Yes    Comment: light use; rarely   Drug use: No    Home Medications Prior to Admission medications   Medication Sig Start Date End Date Taking? Authorizing Provider  apixaban (ELIQUIS) 5 MG TABS tablet Take 1 tablet (5 mg total) by mouth 2 (two)  times daily. 01/15/21  Yes Sherran Needs, NP  Cyanocobalamin (VITAMIN B-12) 5000 MCG SUBL Place 1 tablet under the tongue daily in the afternoon.   Yes [provider]  diltiazem (CARDIZEM CD) 360 MG 24 hr capsule TAKE 1 CAPSULE BY MOUTH EVERY DAY Patient taking differently: Take 360 mg by mouth daily. 04/08/20  Yes Buford Dresser, MD  furosemide (LASIX) 20 MG tablet Take 1 tablet daily as needed for weight gain/swelling Patient taking differently: Take 20 mg by mouth See admin instructions. Take 1 tablet daily as needed for weight gain/swelling 01/15/21  Yes Sherran Needs, NP  GLUCOSAMINE-CHONDROITIN PO Take 1 tablet by mouth 3 (three) times daily. Taking '1500mg'$  by mouth daily   Yes [provider]  ipratropium (ATROVENT) 0.06 % nasal spray Place 2 sprays into both nostrils 3 (three) times daily. 01/30/21  Yes [provider]  levocetirizine (XYZAL) 5 MG tablet Take 5 mg by mouth every evening.   Yes [provider]  loratadine (CLARITIN) 10 MG tablet Take 10 mg by mouth every morning.   Yes [provider]  montelukast (SINGULAIR) 10 MG tablet Take 1 tablet (10 mg total) by mouth at bedtime. 06/21/19  Yes Buford Dresser, MD  Multiple Vitamin (MULTIVITAMIN) tablet Take 1 tablet by mouth daily.   Yes [provider]  Omega-3 Fatty Acids (FISH OIL PO) Take 1,400 mg by mouth daily.   Yes [provider]  potassium chloride (KLOR-CON) 20 MEQ tablet Take 1 tablet daily as needed with lasix use Patient taking differently: Take 20 mEq by mouth See admin instructions. Take 1 tablet daily as needed with lasix use 01/15/21  Yes Sherran Needs, NP  benzonatate (TESSALON) 100 MG capsule Take 100 mg by mouth 3 (three) times daily as needed for cough. 01/30/21   [provider]    Allergies    Patient has no known allergies.  Review of Systems   Review of Systems  Constitutional:  Negative for fever.  HENT:  Negative for  ear pain and sore throat.   Eyes:  Negative for pain.  Respiratory:  Negative for cough.   Cardiovascular:  Negative for chest pain.  Gastrointestinal:  Negative for abdominal pain.  Genitourinary:  Negative for flank pain.  Musculoskeletal:  Negative for back pain.  Skin:  Negative for color change and rash.  Neurological:  Negative for syncope.  All other systems reviewed and are negative.  Physical Exam Updated Vital Signs BP (!) 128/96   Pulse 67   Temp 98.8 F (37.1 C) (Oral)   Resp 18   SpO2 94%   Physical Exam Constitutional:      General: He is not in acute distress.    Appearance: He is well-developed.  HENT:     Head: Normocephalic.     Nose: Nose normal.  Eyes:     Extraocular Movements: Extraocular movements intact.  Cardiovascular:     Rate and Rhythm: Regular rhythm. Tachycardia present.  Pulmonary:     Effort: Pulmonary effort is normal.  Skin:    Coloration: Skin is not jaundiced.  Neurological:     Mental Status: He is alert. Mental status is at baseline.    ED Results / Procedures / Treatments   Labs (all labs ordered are listed, but only abnormal results are displayed) Labs Reviewed  COMPREHENSIVE METABOLIC PANEL - Abnormal; Notable for the following components:      Result Value   Glucose, Bld 151 (*)    Total Protein 6.4 (*)    Albumin 3.4 (*)    All other components within normal limits  CBC WITH DIFFERENTIAL/PLATELET  MAGNESIUM  TROPONIN I (HIGH SENSITIVITY)    EKG EKG shows narrow complex tachycardia heart rate about 140 to 150 bpm.  No ST elevations.  No ST depressions noted.  Rate is tachycardic.  Radiology DG Chest Portable 1 View  Result Date: 02/02/2021 CLINICAL DATA:  Atrial fibrillation and COVID infection. EXAM: PORTABLE CHEST 1 VIEW COMPARISON:  08/12/2018 FINDINGS: Stable top-normal heart size and aortic tortuosity. Lungs demonstrate stable mild pulmonary interstitial prominence without overt infiltrate, edema, pleural  fluid or pneumothorax. IMPRESSION: Stable top-normal heart size. No overt pneumonia or pulmonary edema identified. Electronically Signed   By: Aletta Edouard M.D.   On: 02/02/2021 11:42    Procedures .Critical Care  Date/Time: 02/02/2021 1:14 PM Performed by: Luna Fuse, MD Authorized by: Luna Fuse, MD   Critical care provider statement:    Critical care time (minutes):  30   Critical care time was exclusive of:  Teaching time and separately billable procedures and treating other patients   Critical care was necessary to treat or prevent imminent or life-threatening deterioration of the following conditions:  Cardiac  failure   Medications Ordered in ED Medications  diltiazem (CARDIZEM) injection 20 mg (20 mg Intravenous Given 02/02/21 1147)    ED Course  I have reviewed the triage vital signs and the nursing notes.  Pertinent labs & imaging results that were available during my care of the patient were reviewed by me and considered in my medical decision making (see chart for details).    MDM Rules/Calculators/A&P                           Blood pressure within normal limits heart rate around 1 4150 bpm appears very regular rapid narrow complex tachycardia on EKG.  Patient otherwise comfortable appearing denies any chest pain.  Given diltiazem 20 mg IV with resolution of rapid rate, he now appears to be in a flutter intermittent block heart rate is about 60 to 70 bpm, blood pressure [id number] systolic.  Patient offered monoclonal antibody injection while here in the ER, however after much discussion and explanation of risks and benefits patient declined the procedure.  Will follow up with his doctor within the week.  Advised him to call his cardiologist to follow-up this week.  Advised immediate return for worsening symptoms or any additional concerns.  Final Clinical Impression(s) / ED Diagnoses Final diagnoses:  Atrial fibrillation with rapid ventricular response  [facility])    Rx / DC Orders ED Discharge Orders     None        Luna Fuse, MD 02/02/21 1317

## 2021-02-02 NOTE — ED Triage Notes (Signed)
Pt reports "heart burn" this morning.  Ate breakfast and felt better.  Went for his "Covid infusion" and told HR was elevated to 150s and to come to ED.  Denies pain.  Denies SOB.

## 2021-02-02 NOTE — ED Provider Notes (Signed)
Emergency Medicine Provider Triage Evaluation Note  Charles Cameron , a 75 y.o. male  was evaluated in triage.  Pt complains of COVID symptoms.  COVID x1 week.  Went to infusion clinic sent here due to tachycardia.  History of A. fib/flutter.  Does not have any symptoms from this, no dizziness, lightheadedness, palpitations, chest pain, shortness of breath.  Has taken diltiazem and Eliquis today.  Review of Systems  Positive: Cough (covid) Negative: Cp, sob  Physical Exam  BP (!) 141/107   Pulse (!) 149   Temp 98.8 F (37.1 C) (Oral)   Resp 20   SpO2 100%  Gen:   Awake, no distress   Resp:  Normal effort  MSK:   Moves extremities without difficulty  Other:  Heart rate irregularly irregular and tachycardic.  Medical Decision Making  Medically screening exam initiated at 10:42 AM.  Appropriate orders placed.  RAFIQ OILER was informed that the remainder of the evaluation will be completed by another provider, this initial triage assessment does not replace that evaluation, and the importance of remaining in the ED until their evaluation is complete.  Labs, cxr, and Amado Nash, PA-C 02/02/21 [place], Adam, DO 02/02/21 1231

## 2021-02-03 ENCOUNTER — Other Ambulatory Visit: Payer: Self-pay

## 2021-02-03 ENCOUNTER — Emergency Department (HOSPITAL_COMMUNITY)
Admission: EM | Admit: 2021-02-03 | Discharge: 2021-02-03 | Disposition: A | Discharge status: Home / Self Care | Payer: Medicare Other | Attending: Emergency Medicine | Admitting: Emergency Medicine

## 2021-02-03 ENCOUNTER — Encounter (HOSPITAL_COMMUNITY): Payer: Self-pay

## 2021-02-03 ENCOUNTER — Telehealth: Payer: Self-pay | Admitting: Cardiology

## 2021-02-03 DIAGNOSIS — Z79899 Other long term (current) drug therapy: Secondary | ICD-10-CM | POA: Insufficient documentation

## 2021-02-03 DIAGNOSIS — I483 Typical atrial flutter: Secondary | ICD-10-CM

## 2021-02-03 DIAGNOSIS — I480 Paroxysmal atrial fibrillation: Secondary | ICD-10-CM | POA: Insufficient documentation

## 2021-02-03 DIAGNOSIS — Z7901 Long term (current) use of anticoagulants: Secondary | ICD-10-CM | POA: Insufficient documentation

## 2021-02-03 DIAGNOSIS — Z87891 Personal history of nicotine dependence: Secondary | ICD-10-CM | POA: Diagnosis not present

## 2021-02-03 DIAGNOSIS — R000 Tachycardia, unspecified: Secondary | ICD-10-CM | POA: Diagnosis present

## 2021-02-03 DIAGNOSIS — I4891 Unspecified atrial fibrillation: Secondary | ICD-10-CM

## 2021-02-03 DIAGNOSIS — I4819 Other persistent atrial fibrillation: Secondary | ICD-10-CM

## 2021-02-03 DIAGNOSIS — I10 Essential (primary) hypertension: Secondary | ICD-10-CM | POA: Insufficient documentation

## 2021-02-03 DIAGNOSIS — Z8546 Personal history of malignant neoplasm of prostate: Secondary | ICD-10-CM | POA: Insufficient documentation

## 2021-02-03 DIAGNOSIS — Z8616 Personal history of COVID-19: Secondary | ICD-10-CM | POA: Diagnosis not present

## 2021-02-03 MED ORDER — DILTIAZEM HCL 30 MG PO TABS: 30.0000 mg | ORAL_TABLET | Freq: Four times a day (QID) | ORAL | 0 refills | Status: DC | PRN

## 2021-02-03 NOTE — Telephone Encounter (Signed)
New Message:       Pt called and wanted Dr Harrell Gave to know, he is on his way to [facility] ER for Afib. He said he ws also seen there this weekend.

## 2021-02-03 NOTE — Consult Note (Signed)
Cardiology Consultation:   Patient ID: Charles Cameron MRN: [id number]; DOB: January 01, 1946  Admit date: 02/03/2021 Date of Consult: 02/03/2021  PCP:  Doreatha Lew, MD   [facility] Providers Cardiologist:  Buford Dresser, MD        Patient Profile:   Charles Cameron is a 75 y.o. male with a hx of atrial fibrillation and atrial flutter who is being seen 02/03/2021 for the evaluation of tachycardia at the request of Dr. Eulis Foster.  History of Present Illness:   Charles Cameron has a longstanding history of paroxysmal atrial flutter and paroxysmal atrial fibrillation, but recently has had a lengthy episode of persistent atrial fibrillation.  Relation was initially diagnosed in the setting of pneumonia in January 2020.  Anticoagulation was subsequently discontinued.  He was seen in the atrial fibrillation clinic on July 6 and was in atrial fibrillation at the time.  CHA2DS2-VASc score 3 (age, HTN).  Eliquis was prescribed and started on July 7.  He thought that his arrhythmia resolved since his heart rate was slower after starting oral diltiazem sustained-release, but on a couple of occasions has had heart rates up to the 140-150 range (as measured by his automatic blood pressure cuff).  He was seen for these both yesterday and today in the emergency room.  He presented because he was concerned about the rapid rate, but was minimally symptomatic.  He denies palpitations and did not have dizziness or syncope, dyspnea with usual activity, orthopnea, PND or lower extremity edema.  He has not had any bleeding problems since he started treatment with Eliquis.  Electrocardiograms show that he is well rate controlled when he has coarse atrial fibrillation, but at times the rhythm organizes to typical atrial flutter with 2: 1 AV block and ventricular rate of 150 bpm.  Denies angina, dyspnea at rest or with activity (he exercises 3 times a week and does yoga twice a week), palpitations, dizziness,  syncope, focal neurological events, intermittent claudication or lower extremity edema.   Past Medical History:  Diagnosis Date   Anticoagulation adequate 07/14/2018   Cancer ([place])    Edema 07/14/2018   Hypertension    Prostate cancer ([place]) 2016   URI (upper respiratory infection) 07/14/2018    Past Surgical History:  Procedure Laterality Date   LUMBAR LAMINECTOMY  1989       Inpatient Medications: Scheduled Meds:  Continuous Infusions:  PRN Meds:   Allergies:   No Known Allergies  Social History:   Social History   Socioeconomic History   Marital status: Married    Spouse name: Charles Cameron   Number of children: 5   Years of education: 16   Highest education level: Not on file  Occupational History   Occupation: retired  Tobacco Use   Smoking status: Former    Packs/day: 2.00    Years: 27.00    Pack years: 54.00    Types: Cigarettes    Quit date: 07/14/1983    Years since quitting: 37.5   Smokeless tobacco: Never  Substance and Sexual Activity   Alcohol use: Yes    Comment: light use; rarely   Drug use: No   Sexual activity: Not on file  Other Topics Concern   Not on file  Social History Narrative   Patient lives at home with spouse.   Caffeine Use: 2 cups daily   Social Determinants of Health   Financial Resource Strain: Not on file  Food Insecurity: Not on file  Transportation Needs: Not on file  Physical Activity: Not on file  Stress: Not on file  Social Connections: Not on file  Intimate Partner Violence: Not on file    Family History:    Family History  Problem Relation Age of Onset   Heart attack Mother    Hypertension Mother    Prostate cancer Father    Hypertension Father    Colon cancer Other    Diabetes Paternal Grandfather      ROS:  Please see the history of present illness.   All other ROS reviewed and negative.     Physical Exam/Data:   Vitals:   02/03/21 1412 02/03/21 1445 02/03/21 1515 02/03/21 1545  BP: (!) 122/96 115/80  113/76 (!) 113/94  Pulse: (!) 155 80 78 70  Resp: 20 19 (!) 23 15  Temp: 97.8 F (36.6 C)     TempSrc: Oral     SpO2: 97% 97% 97% 95%   No intake or output data in the 24 hours ending 02/03/21 1605 Last 3 Weights 01/15/2021 04/02/2020 06/21/2019  Weight (lbs) 236 lb 12.8 oz 222 lb 9.6 oz 232 lb  Weight (kg) 107.412 kg 100.971 kg 105.235 kg     There is no height or weight on file to calculate BMI.  General:  Well nourished, well developed, in no acute distress obese HEENT: normal Lymph: no adenopathy Neck: no JVD Endocrine:  No thryomegaly Vascular: No carotid bruits; FA pulses 2+ bilaterally without bruits  Cardiac:  normal S1, S2; irregular; no murmur  Lungs:  clear to auscultation bilaterally, no wheezing, rhonchi or rales  Abd: soft, nontender, no hepatomegaly  Ext: no edema Musculoskeletal:  No deformities, BUE and BLE strength normal and equal Skin: warm and dry  Neuro:  CNs 2-12 intact, no focal abnormalities noted Psych:  Normal affect   EKG:  The EKG was personally reviewed and demonstrates: Atrial fibrillation with controlled ventricular rate, right bundle branch block; the tracing from 02/02/2021 at 1035 hrs. shows typical counterclockwise atrial flutter with 2: 1 AV conduction and a ventricular rate of 150 bpm Telemetry:  Telemetry was personally reviewed and demonstrates: Atrial fibrillation with controlled ventricular response  Relevant CV Studies: 07/11/2018 echocardiogram  - Left ventricle: The cavity size was normal. Wall thickness was    increased in a pattern of mild LVH. Systolic function was normal.    The estimated ejection fraction was in the range of 55% to 60%.    Wall motion was normal; there were no regional wall motion    abnormalities. There was no evidence of elevated ventricular    filling pressure by Doppler parameters.  - Aortic valve: Trileaflet; moderately thickened, moderately    calcified leaflets. Valve mobility was restricted. There was  mild    regurgitation.  - Mitral valve: There was moderate regurgitation.  - Right ventricle: The cavity size was mildly dilated. Wall    thickness was normal.  - Right atrium: The atrium was mildly dilated.   Laboratory Data:  High Sensitivity Troponin:   Recent Labs  Lab 02/02/21 1113  TROPONINIHS 6     Chemistry Recent Labs  Lab 02/02/21 1047  NA 139  K 3.7  CL 108  CO2 24  GLUCOSE 151*  BUN 13  CREATININE 1.17  CALCIUM 9.4  GFRNONAA >60  ANIONGAP 7    Recent Labs  Lab 02/02/21 1047  PROT 6.4*  ALBUMIN 3.4*  AST 28  ALT 36  ALKPHOS 116  BILITOT 0.7   Hematology Recent Labs  Lab 02/02/21  1047  WBC 7.5  RBC 5.15  HGB 16.9  HCT 48.7  MCV 94.6  MCH 32.8  MCHC 34.7  RDW 12.5  PLT 190   BNPNo results for input(s): BNP, PROBNP in the last 168 hours.  DDimer No results for input(s): DDIMER in the last 168 hours.   Radiology/Studies:  DG Chest Portable 1 View  Result Date: 02/02/2021 CLINICAL DATA:  Atrial fibrillation and COVID infection. EXAM: PORTABLE CHEST 1 VIEW COMPARISON:  08/12/2018 FINDINGS: Stable top-normal heart size and aortic tortuosity. Lungs demonstrate stable mild pulmonary interstitial prominence without overt infiltrate, edema, pleural fluid or pneumothorax. IMPRESSION: Stable top-normal heart size. No overt pneumonia or pulmonary edema identified. Electronically Signed   By: Aletta Edouard M.D.   On: 02/02/2021 11:42     Assessment and Plan:   Persistent atrial fibrillation: Currently well rate controlled.  He has been on anticoagulants now since July 7 (18 days).  We will be able to undergo a cardioversion next week and will go ahead and schedule that.  No evidence of heart failure at this time.  Compliant with anticoagulation. Paroxysmal atrial flutter: The atrial fibrillation intermittently organizes to a more coherent typical right atrial flutter pattern which conducts much more rapidly through the AV node. I recommend a small  supply of diltiazem 30 mg immediate release tablets that he can take as needed for extreme tachycardia.  Fortunately, the arrhythmia appears to be minimally symptomatic.  It is possible that he will require some type of antiarrhythmic intervention, either pharmacological or with ablation, if he has recurrent episodes of rapid ventricular response due to atrial flutter.  Consider EP referral.  Okay for discharge from the emergency room.  He should keep his follow-up appointment in the A. fib clinic.  We will go ahead and schedule the cardioversion now, for earliest available next week.   Risk Assessment/Risk Scores:          CHA2DS2-VASc Score =    This indicates a  % annual risk of stroke. The patient's score is based upon:           For questions or updates, please contact [place] Please consult [email or web address] for contact info under    Signed, Sanda Klein, MD  02/03/2021 4:05 PM

## 2021-02-03 NOTE — Discharge Instructions (Addendum)
Continue taking home medication as prescribed including your daily eliquis and diltiazem.  If you heart rate goes up (over 120), take one of the immediate release diltiazem pills.  Follow up with the a fib clinic tomorrow as scheduled.  Return to the ER with any new, worsening, or concerning symptoms.

## 2021-02-03 NOTE — ED Provider Notes (Signed)
  Face-to-face evaluation   History: Alert male who is having intermittent atrial fibrillation with tachycardia, for several days.  Evaluate here for same yesterday and required diltiazem.  He returns now because of recurrence of same symptoms.  He does not take extra medication for periods of atrial fibrillation.  He has recently started on anticoagulation.  He is not having chest pain, syncope or weakness.  Since arriving in the ED, his tachycardia has resolved.  Physical exam: Alert elderly male.  At this time he has a slow atrial fibrillation, rates between 80 and 90 during evaluation.  No respiratory distress.  No dysarthria or aphasia.  Medical screening examination/treatment/procedure(s) were conducted as a shared visit with non-physician practitioner(s) and myself.  I personally evaluated the patient during the encounter    Daleen Bo, MD 02/04/21 [phone]

## 2021-02-03 NOTE — ED Triage Notes (Signed)
Pt reports his a fib is back. Pt took his vitals this morning and his heart rate was reading 150s. Denies chest pain or SOB

## 2021-02-03 NOTE — ED Provider Notes (Signed)
Emergency Medicine Provider Triage Evaluation Note  Charles Cameron , a 75 y.o. male  was evaluated in triage.  Pt complains of A. fib.  Patient was seen here yesterday for A. fib, A. fib started yesterday morning.  Was converted with Cardizem.  Patient is on Eliquis and diltiazem that he takes daily, has been compliant with it.  Patient denies any palpitations chest pain or shortness of breath, does admit to some fatigue.  Patient states that he never knows he is in A. fib, he does take his blood every morning and yesterday morning he was in the 150s, states that today was also in the 150s.  When he left the hospital yesterday he was feeling fine.  Review of Systems  Positive: A. fib fatigue Negative: Chest pain, shortness of breath  Physical Exam  There were no vitals taken for this visit. Gen:   Awake, no distress, A. fib with rate in the 160s Resp:  Normal effort  MSK:   Moves extremities without difficulty  Other:    Medical Decision Making  Medically screening exam initiated at 2:08 PM.  Appropriate orders placed.  Charles Cameron was informed that the remainder of the evaluation will be completed by another provider, this initial triage assessment does not replace that evaluation, and the importance of remaining in the ED until their evaluation is complete.  EKG with A. fib in the 160s, patient to be pulled back next.  Patient is stable.   Charles Client, PA-C 02/03/21 1409    Charles Bo, MD 02/04/21 [phone]

## 2021-02-03 NOTE — ED Provider Notes (Signed)
[facility] EMERGENCY DEPARTMENT Provider Note   CSN: [id number] Arrival date & time: 02/03/21  1315     History Chief Complaint  Patient presents with   Atrial Fibrillation    Charles Cameron is a 75 y.o. male presenting for evaluation of A. fib.  Patient states he is feeling well, he has been asymptomatic all day.  He was in the ER yesterday for A. fib with RVR.  Treated with IV medicine, was feeling well on discharge.  Today he went about his normal routine, took his Eliquis and his dose of Tyzine as prescribed.  At lunch, he was found to have an elevated heart rate when his wife checked it.  He denies any symptoms at the time including dizziness, lightheadedness, chest pain, shortness of breath, cough, chest heaviness.  Of note, patient had COVID approximately 10 days ago, has completely recovered from this.  He denies fevers, chills, urinary symptoms, normal bowel movements.  Has been eating and drinking well.  Additional history obtained in chart review.  Patient with a history of hypertension, prostate cancer, paroxysmal A. fib on anticoagulation, a flutter.  I reviewed patient's ER note from yesterday.  HPI     Past Medical History:  Diagnosis Date   Anticoagulation adequate 07/14/2018   Cancer ([place])    Edema 07/14/2018   Hypertension    Prostate cancer ([place]) 2016   URI (upper respiratory infection) 07/14/2018    Patient Active Problem List   Diagnosis Date Noted   Essential hypertension 09/06/2018   Long term (current) use of anticoagulants 08/15/2018   Paroxysmal atrial fibrillation ([place]) 08/15/2018   Atrial flutter ([place]) 07/10/2018    Past Surgical History:  Procedure Laterality Date   LUMBAR LAMINECTOMY  1989       Family History  Problem Relation Age of Onset   Heart attack Mother    Hypertension Mother    Prostate cancer Father    Hypertension Father    Colon cancer Other    Diabetes Paternal Grandfather     Social History    Tobacco Use   Smoking status: Former    Packs/day: 2.00    Years: 27.00    Pack years: 54.00    Types: Cigarettes    Quit date: 07/14/1983    Years since quitting: 37.5   Smokeless tobacco: Never  Substance Use Topics   Alcohol use: Yes    Comment: light use; rarely   Drug use: No    Home Medications Prior to Admission medications   Medication Sig Start Date End Date Taking? Authorizing Provider  diltiazem (CARDIZEM) 30 MG tablet Take 1 tablet (30 mg total) by mouth 4 (four) times daily as needed (tachycardia). 02/03/21  Yes Chadwick Reiswig, PA-C  apixaban (ELIQUIS) 5 MG TABS tablet Take 1 tablet (5 mg total) by mouth 2 (two) times daily. 01/15/21   Sherran Needs, NP  benzonatate (TESSALON) 100 MG capsule Take 100 mg by mouth 3 (three) times daily as needed for cough. 01/30/21   [provider]  Cyanocobalamin (VITAMIN B-12) 5000 MCG SUBL Place 1 tablet under the tongue daily in the afternoon.    [provider]  diltiazem (CARDIZEM CD) 360 MG 24 hr capsule TAKE 1 CAPSULE BY MOUTH EVERY DAY Patient taking differently: Take 360 mg by mouth daily. 04/08/20   Buford Dresser, MD  furosemide (LASIX) 20 MG tablet Take 1 tablet daily as needed for weight gain/swelling Patient taking differently: Take 20 mg by mouth See  admin instructions. Take 1 tablet daily as needed for weight gain/swelling 01/15/21   Sherran Needs, NP  GLUCOSAMINE-CHONDROITIN PO Take 1 tablet by mouth 3 (three) times daily. Taking '1500mg'$  by mouth daily    [provider]  ipratropium (ATROVENT) 0.06 % nasal spray Place 2 sprays into both nostrils 3 (three) times daily. 01/30/21   [provider]  levocetirizine (XYZAL) 5 MG tablet Take 5 mg by mouth every evening.    [provider]  loratadine (CLARITIN) 10 MG tablet Take 10 mg by mouth every morning.    [provider]  montelukast (SINGULAIR) 10 MG tablet Take 1 tablet (10 mg total) by mouth at bedtime.  06/21/19   Buford Dresser, MD  Multiple Vitamin (MULTIVITAMIN) tablet Take 1 tablet by mouth daily.    [provider]  Omega-3 Fatty Acids (FISH OIL PO) Take 1,400 mg by mouth daily.    [provider]  potassium chloride (KLOR-CON) 20 MEQ tablet Take 1 tablet daily as needed with lasix use Patient taking differently: Take 20 mEq by mouth See admin instructions. Take 1 tablet daily as needed with lasix use 01/15/21   Sherran Needs, NP    Allergies    Patient has no known allergies.  Review of Systems   Review of Systems  Hematological:  Bruises/bleeds easily.  All other systems reviewed and are negative.  Physical Exam Updated Vital Signs BP (!) 113/94   Pulse 70   Temp 97.8 F (36.6 C) (Oral)   Resp 15   SpO2 95%   Physical Exam Vitals and nursing note reviewed.  Constitutional:      General: He is not in acute distress.    Appearance: Normal appearance.     Comments: Resting comfortably in the bed in no acute distress  HENT:     Head: Normocephalic and atraumatic.  Eyes:     Conjunctiva/sclera: Conjunctivae normal.     Pupils: Pupils are equal, round, and reactive to light.  Cardiovascular:     Rate and Rhythm: Normal rate. Rhythm irregular.     Pulses: Normal pulses.     Comments: Irregularly irregular. Not tachycardic on my exam (HR btwn 70 and 85) Pulmonary:     Effort: Pulmonary effort is normal. No respiratory distress.     Breath sounds: Normal breath sounds. No wheezing.     Comments: Speaking in full sentences.  Clear lung sounds in all fields. Abdominal:     General: There is no distension.     Palpations: Abdomen is soft. There is no mass.     Tenderness: There is no abdominal tenderness. There is no guarding or rebound.  Musculoskeletal:        General: Normal range of motion.     Cervical back: Normal range of motion and neck supple.  Skin:    General: Skin is warm and dry.     Capillary Refill: Capillary refill takes less  than 2 seconds.  Neurological:     Mental Status: He is alert and oriented to person, place, and time.  Psychiatric:        Mood and Affect: Mood and affect normal.        Speech: Speech normal.        Behavior: Behavior normal.    ED Results / Procedures / Treatments   Labs (all labs ordered are listed, but only abnormal results are displayed) Labs Reviewed - No data to display   EKG EKG Interpretation  Date/Time:  Monday February 03 2021 15:11:38 EDT Ventricular Rate:  75 PR Interval:    QRS Duration: 152 QT Interval:  392 QTC Calculation: 438 R Axis:   -37 Text Interpretation: Atrial fibrillation Right bundle branch block Since last tracing of earlier today rate slower, and now irregular Confirmed by Daleen Bo [phone]) on 02/03/2021 3:27:21 PM  Radiology DG Chest Portable 1 View  Result Date: 02/02/2021 CLINICAL DATA:  Atrial fibrillation and COVID infection. EXAM: PORTABLE CHEST 1 VIEW COMPARISON:  08/12/2018 FINDINGS: Stable top-normal heart size and aortic tortuosity. Lungs demonstrate stable mild pulmonary interstitial prominence without overt infiltrate, edema, pleural fluid or pneumothorax. IMPRESSION: Stable top-normal heart size. No overt pneumonia or pulmonary edema identified. Electronically Signed   By: Aletta Edouard M.D.   On: 02/02/2021 11:42    Procedures Procedures   Medications Ordered in ED Medications - No data to display  ED Course  I have reviewed the triage vital signs and the nursing notes.  Pertinent labs & imaging results that were available during my care of the patient were reviewed by me and considered in my medical decision making (see chart for details).    MDM Rules/Calculators/A&P                           Patient presenting for evaluation of A. fib with RVR.  On exam, patient has an irregularly irregular heart rate, however he is no longer in RVR.  He is symptom-free.  He is anticoagulated, but has not been anticoagulated for a full  3 weeks.  As this is patient's second visit in 24 hours for A. fib with RVR, will consult cardiology.  Patient had normal labs and chest x-ray yesterday, will hold off on repeating.  Discussed with Dr. Sallyanne Kuster from cardiology, who evaluated the patient.  Agrees that patient is not optimally anticoagulated, and risks outweigh benefits.  Recommends patient receive a short course of 30 mg immediate release diltiazem to use as needed for episodes of tachycardia, and patient can follow-up with the A. fib clinic tomorrow.  We will discuss outpatient cardioversion in the next week.  Discussed findings and plan with patient and wife, who are agreeable.  Discussed how to use the IR diltiazem, as well as importance of taking his daily diltiazem.  At this time, patient received a discharge.  Return precautions given.  Patient states he understands and agrees to plan.  Final Clinical Impression(s) / ED Diagnoses Final diagnoses:  Atrial fibrillation, unspecified type [facility])    Rx / DC Orders ED Discharge Orders          Ordered    diltiazem (CARDIZEM) 30 MG tablet  4 times daily PRN        02/03/21 1548             Rakeya Glab, PA-C 02/03/21 1611    Daleen Bo, MD 02/04/21 1611

## 2021-02-04 ENCOUNTER — Ambulatory Visit (HOSPITAL_COMMUNITY)
Admission: RE | Admit: 2021-02-04 | Discharge: 2021-02-04 | Disposition: A | Discharge status: Home / Self Care | Payer: Medicare Other | Source: Ambulatory Visit | Attending: Nurse Practitioner | Admitting: Nurse Practitioner

## 2021-02-04 ENCOUNTER — Encounter (HOSPITAL_COMMUNITY): Payer: Self-pay | Admitting: Physician Assistant

## 2021-02-04 VITALS — BP 132/78 | HR 77 | Ht 70.0 in | Wt 235.8 lb

## 2021-02-04 DIAGNOSIS — D6869 Other thrombophilia: Secondary | ICD-10-CM | POA: Diagnosis not present

## 2021-02-04 DIAGNOSIS — J189 Pneumonia, unspecified organism: Secondary | ICD-10-CM | POA: Diagnosis not present

## 2021-02-04 DIAGNOSIS — I4819 Other persistent atrial fibrillation: Secondary | ICD-10-CM | POA: Diagnosis not present

## 2021-02-04 DIAGNOSIS — I10 Essential (primary) hypertension: Secondary | ICD-10-CM | POA: Insufficient documentation

## 2021-02-04 DIAGNOSIS — I4891 Unspecified atrial fibrillation: Secondary | ICD-10-CM | POA: Insufficient documentation

## 2021-02-04 DIAGNOSIS — Z79899 Other long term (current) drug therapy: Secondary | ICD-10-CM | POA: Diagnosis not present

## 2021-02-04 DIAGNOSIS — Z8249 Family history of ischemic heart disease and other diseases of the circulatory system: Secondary | ICD-10-CM | POA: Insufficient documentation

## 2021-02-04 DIAGNOSIS — Z7901 Long term (current) use of anticoagulants: Secondary | ICD-10-CM | POA: Insufficient documentation

## 2021-02-04 DIAGNOSIS — Z87891 Personal history of nicotine dependence: Secondary | ICD-10-CM | POA: Diagnosis not present

## 2021-02-04 DIAGNOSIS — I4892 Unspecified atrial flutter: Secondary | ICD-10-CM | POA: Insufficient documentation

## 2021-02-04 NOTE — Progress Notes (Signed)
Primary Care Physician: Patrecia Pour, Christean Grief, MD Referring Physician: Dr. Mervin Kung Charles Cameron is a 75 y.o. male with a h/o afb in the setting of pneumonia 07/2019. He converted to SR in the hospital. He continued on diltiazem but eliquis was stopped a few months later as he had a CHA2DS2VASc  score of 1. He returned to afib, rate controlled 6/30. His CHA2DS2VASc  score is now 3. He restarted an asa last week but will now stop this as he will need to go back on eliquis. Ekg shows afib today with a v rate of 92 bpm.   In the clinic, his weight is up 6 lbs from when he had his last weight in Epic and has seen more pedal edema for the last few days. No issues with PND/orthopnea. He does exercise 3 x a week and goes to yoga 2x a week. He feels he could do better with his diet. He does drink an alcoholic beverage a night. No tobacco. Denies any snoring issues.   Follow up in the AF clinic 02/04/21. Patient was seen at the ED twice 7/24 and 7/25 with rapid rates. Appears he was in rate controlled afib which converted to rapidly conducting atrial flutter at times. He was minimally symptomatic but does feel that he has more energy in SR. He has converted to SR this AM.  Today, he denies symptoms of palpitations, chest pain, shortness of breath, orthopnea, PND, lower extremity edema, dizziness, presyncope, syncope, or neurologic sequela. The patient is tolerating medications without difficulties and is otherwise without complaint today.   Past Medical History:  Diagnosis Date   Anticoagulation adequate 07/14/2018   Cancer ([place])    Edema 07/14/2018   Hypertension    Prostate cancer ([place]) 2016   URI (upper respiratory infection) 07/14/2018   Past Surgical History:  Procedure Laterality Date   LUMBAR LAMINECTOMY  1989    Current Outpatient Medications  Medication Sig Dispense Refill   apixaban (ELIQUIS) 5 MG TABS tablet Take 1 tablet (5 mg total) by mouth 2 (two) times daily. 60 tablet 3    benzonatate (TESSALON) 100 MG capsule Take 100 mg by mouth 3 (three) times daily as needed for cough.     Cyanocobalamin (VITAMIN B-12) 5000 MCG SUBL Place 1 tablet under the tongue daily in the afternoon.     diltiazem (CARDIZEM CD) 360 MG 24 hr capsule TAKE 1 CAPSULE BY MOUTH EVERY DAY (Patient taking differently: Take 360 mg by mouth daily.) 90 capsule 3   diltiazem (CARDIZEM) 30 MG tablet Take 1 tablet (30 mg total) by mouth 4 (four) times daily as needed (tachycardia). 15 tablet 0   furosemide (LASIX) 20 MG tablet Take 1 tablet daily as needed for weight gain/swelling (Patient taking differently: Take 20 mg by mouth See admin instructions. Take 1 tablet daily as needed for weight gain/swelling) 20 tablet 1   GLUCOSAMINE-CHONDROITIN PO Take 1 tablet by mouth 3 (three) times daily. Taking '1500mg'$  by mouth daily     ipratropium (ATROVENT) 0.06 % nasal spray Place 2 sprays into both nostrils 3 (three) times daily.     levocetirizine (XYZAL) 5 MG tablet Take 5 mg by mouth every evening.     loratadine (CLARITIN) 10 MG tablet Take 10 mg by mouth every morning.     montelukast (SINGULAIR) 10 MG tablet Take 1 tablet (10 mg total) by mouth at bedtime. 90 tablet 3   Multiple Vitamin (MULTIVITAMIN) tablet Take 1 tablet by mouth  daily.     Omega-3 Fatty Acids (FISH OIL PO) Take 1,400 mg by mouth daily.     potassium chloride (KLOR-CON) 20 MEQ tablet Take 1 tablet daily as needed with lasix use (Patient taking differently: Take 20 mEq by mouth See admin instructions. Take 1 tablet daily as needed with lasix use) 20 tablet 1   No current facility-administered medications for this visit.    No Known Allergies  Social History   Socioeconomic History   Marital status: Married    Spouse name: [name]   Number of children: 5   Years of education: 16   Highest education level: Not on file  Occupational History   Occupation: retired  Tobacco Use   Smoking status: Former    Packs/day: 2.00    Years:  27.00    Pack years: 54.00    Types: Cigarettes    Quit date: 07/14/1983    Years since quitting: 37.5   Smokeless tobacco: Never  Substance and Sexual Activity   Alcohol use: Yes    Comment: light use; rarely   Drug use: No   Sexual activity: Not on file  Other Topics Concern   Not on file  Social History Narrative   Patient lives at home with spouse.   Caffeine Use: 2 cups daily   Social Determinants of Health   Financial Resource Strain: Not on file  Food Insecurity: Not on file  Transportation Needs: Not on file  Physical Activity: Not on file  Stress: Not on file  Social Connections: Not on file  Intimate Partner Violence: Not on file    Family History  Problem Relation Age of Onset   Heart attack Mother    Hypertension Mother    Prostate cancer Father    Hypertension Father    Colon cancer Other    Diabetes Paternal Grandfather     ROS- All systems are reviewed and negative except as per the HPI above  Physical Exam: There were no vitals filed for this visit. Wt Readings from Last 3 Encounters:  01/15/21 107.4 kg  04/02/20 101 kg  06/21/19 105.2 kg    Labs: Lab Results  Component Value Date   NA 139 02/02/2021   K 3.7 02/02/2021   CL 108 02/02/2021   CO2 24 02/02/2021   GLUCOSE 151 (H) 02/02/2021   BUN 13 02/02/2021   CREATININE 1.17 02/02/2021   CALCIUM 9.4 02/02/2021   MG 2.2 02/02/2021   Lab Results  Component Value Date   INR 1.43 07/11/2018   No results found for: CHOL, HDL, LDLCALC, TRIG   GEN- The patient is a well appearing obese male, alert and oriented x 3 today.   HEENT-head normocephalic, atraumatic, sclera clear, conjunctiva pink, hearing intact, trachea midline. Lungs- Clear to ausculation bilaterally, normal work of breathing Heart- Regular rate and rhythm, no murmurs, rubs or gallops  GI- soft, NT, ND, + BS Extremities- no clubbing, cyanosis, or edema MS- no significant deformity or atrophy Skin- no rash or lesion Psych-  euthymic mood, full affect Neuro- strength and sensation are intact   EKG- SR, RBBB Vent. rate 77 BPM PR interval 174 ms QRS duration 144 ms QT/QTcB 424/479 ms   Echo 2019-Left ventricle: The cavity size was normal. Wall thickness was    increased in a pattern of mild LVH. Systolic function was normal.    The estimated ejection fraction was in the range of 55% to 60%.    Wall motion was normal; there were no  regional wall motion    abnormalities. There was no evidence of elevated ventricular    filling pressure by Doppler parameters.  - Aortic valve: Trileaflet; moderately thickened, moderately    calcified leaflets. Valve mobility was restricted. There was mild    regurgitation.  - Mitral valve: There was moderate regurgitation.  - Right ventricle: The cavity size was mildly dilated. Wall    thickness was normal.  - Right atrium: The atrium was mildly dilated.   Epic records reviewed   Assessment and Plan:  1. Afib/atrial flutter Has reoccurred from initial onset with pneumonia 07/2018 He is back in SR today. Will cancel DCCV. We discussed long term therapeutic options including AAD vs ablation. He would like to be considered for ablation, will refer to EP. Continue diltiazem at 360 mg daily with 30 mg PRN q 4 hours for heart racing.  2. CHA2DS2VASc  score of 3  Continue eliquis 5 mg BID  3. HTN Stable, no changes today.   Follow up with EP for ablation consideration.    [facility] [address] [place], [place] [address] [phone]

## 2021-02-05 ENCOUNTER — Ambulatory Visit (HOSPITAL_COMMUNITY): Payer: Medicare Other | Admitting: Physician Assistant

## 2021-02-10 ENCOUNTER — Encounter (HOSPITAL_BASED_OUTPATIENT_CLINIC_OR_DEPARTMENT_OTHER): Payer: Self-pay

## 2021-02-11 ENCOUNTER — Encounter (HOSPITAL_COMMUNITY): Payer: Self-pay

## 2021-02-11 ENCOUNTER — Ambulatory Visit (HOSPITAL_COMMUNITY): Admit: 2021-02-11 | Payer: Medicare Other | Admitting: Cardiovascular Disease

## 2021-02-11 SURGERY — CARDIOVERSION
Anesthesia: General

## 2021-02-19 ENCOUNTER — Other Ambulatory Visit: Payer: Self-pay

## 2021-02-19 ENCOUNTER — Encounter (HOSPITAL_COMMUNITY): Payer: Self-pay | Admitting: Physician Assistant

## 2021-02-19 ENCOUNTER — Encounter (HOSPITAL_BASED_OUTPATIENT_CLINIC_OR_DEPARTMENT_OTHER): Payer: Self-pay

## 2021-02-19 ENCOUNTER — Telehealth (HOSPITAL_BASED_OUTPATIENT_CLINIC_OR_DEPARTMENT_OTHER): Payer: Self-pay | Admitting: Cardiology

## 2021-02-19 ENCOUNTER — Ambulatory Visit (HOSPITAL_COMMUNITY)
Admission: RE | Admit: 2021-02-19 | Discharge: 2021-02-19 | Disposition: A | Discharge status: Home / Self Care | Payer: Medicare Other | Source: Ambulatory Visit | Attending: Physician Assistant | Admitting: Physician Assistant

## 2021-02-19 VITALS — BP 82/62 | HR 79 | Ht 70.0 in | Wt 235.6 lb

## 2021-02-19 DIAGNOSIS — Z87891 Personal history of nicotine dependence: Secondary | ICD-10-CM | POA: Insufficient documentation

## 2021-02-19 DIAGNOSIS — I480 Paroxysmal atrial fibrillation: Secondary | ICD-10-CM | POA: Insufficient documentation

## 2021-02-19 DIAGNOSIS — I10 Essential (primary) hypertension: Secondary | ICD-10-CM | POA: Diagnosis not present

## 2021-02-19 DIAGNOSIS — D6869 Other thrombophilia: Secondary | ICD-10-CM | POA: Diagnosis not present

## 2021-02-19 DIAGNOSIS — Z8249 Family history of ischemic heart disease and other diseases of the circulatory system: Secondary | ICD-10-CM | POA: Insufficient documentation

## 2021-02-19 DIAGNOSIS — Z79899 Other long term (current) drug therapy: Secondary | ICD-10-CM | POA: Diagnosis not present

## 2021-02-19 DIAGNOSIS — I4892 Unspecified atrial flutter: Secondary | ICD-10-CM | POA: Diagnosis not present

## 2021-02-19 DIAGNOSIS — Z7901 Long term (current) use of anticoagulants: Secondary | ICD-10-CM | POA: Diagnosis not present

## 2021-02-19 NOTE — Progress Notes (Signed)
Primary Care Physician: Patrecia Pour, Christean Grief, MD Referring Physician: Dr. Mervin Kung Charles Cameron is a 75 y.o. male with a h/o afb in the setting of pneumonia 07/2019. He converted to SR in the hospital. He continued on diltiazem but eliquis was stopped a few months later as he had a CHA2DS2VASc  score of 1. He returned to afib, rate controlled 6/30. His CHA2DS2VASc  score is now 3. He restarted an asa last week but will now stop this as he will need to go back on eliquis. Ekg shows afib today with a v rate of 92 bpm.   In the clinic, his weight is up 6 lbs from when he had his last weight in Epic and has seen more pedal edema for the last few days. No issues with PND/orthopnea. He does exercise 3 x a week and goes to yoga 2x a week. He feels he could do better with his diet. He does drink an alcoholic beverage a night. No tobacco. Denies any snoring issues.   Follow up in the AF clinic 02/04/21. Patient was seen at the ED twice 7/24 and 7/25 with rapid rates. Appears he was in rate controlled afib which converted to rapidly conducting atrial flutter at times. He was minimally symptomatic but does feel that he has more energy in SR. He has converted to SR this AM.  Follow up in the AF clinic 02/19/21. Patient reports that he went to the gym and noted his heart rate was elevated on the treadmill at 140-150 bpm. He walked a mile on the treadmill and was asymptomatic. He took a PRN diltiazem once he got home. His heart rate remained elevated and he presented to the AF clinic for follow up. ECG here shows rate controlled afib. He remains asymptomatic.   Today, he denies symptoms of palpitations, chest pain, shortness of breath, orthopnea, PND, lower extremity edema, dizziness, presyncope, syncope, or neurologic sequela. The patient is tolerating medications without difficulties and is otherwise without complaint today.   Past Medical History:  Diagnosis Date   Anticoagulation adequate 07/14/2018    Cancer ([place])    Edema 07/14/2018   Hypertension    Prostate cancer ([place]) 2016   URI (upper respiratory infection) 07/14/2018   Past Surgical History:  Procedure Laterality Date   LUMBAR LAMINECTOMY  1989    Current Outpatient Medications  Medication Sig Dispense Refill   apixaban (ELIQUIS) 5 MG TABS tablet Take 1 tablet (5 mg total) by mouth 2 (two) times daily. 60 tablet 3   benzonatate (TESSALON) 100 MG capsule Take 100 mg by mouth 3 (three) times daily as needed for cough.     Cyanocobalamin (VITAMIN B-12) 5000 MCG SUBL Place 1 tablet under the tongue daily in the afternoon.     diltiazem (CARDIZEM CD) 360 MG 24 hr capsule TAKE 1 CAPSULE BY MOUTH EVERY DAY 90 capsule 3   diltiazem (CARDIZEM) 30 MG tablet Take 1 tablet (30 mg total) by mouth 4 (four) times daily as needed (tachycardia). 15 tablet 0   furosemide (LASIX) 20 MG tablet Take 1 tablet daily as needed for weight gain/swelling 20 tablet 1   GLUCOSAMINE-CHONDROITIN PO Take 1 tablet by mouth 3 (three) times daily. Taking '1500mg'$  by mouth daily     ipratropium (ATROVENT) 0.06 % nasal spray Place 2 sprays into both nostrils 3 (three) times daily.     levocetirizine (XYZAL) 5 MG tablet Take 5 mg by mouth every evening.     loratadine (  CLARITIN) 10 MG tablet Take 10 mg by mouth every morning.     montelukast (SINGULAIR) 10 MG tablet Take 1 tablet (10 mg total) by mouth at bedtime. 90 tablet 3   Multiple Vitamin (MULTIVITAMIN) tablet Take 1 tablet by mouth daily.     Omega-3 Fatty Acids (FISH OIL PO) Take 1,400 mg by mouth daily.     potassium chloride (KLOR-CON) 20 MEQ tablet Take 1 tablet daily as needed with lasix use 20 tablet 1   No current facility-administered medications for this encounter.    No Known Allergies  Social History   Socioeconomic History   Marital status: Married    Spouse name: [name]   Number of children: 5   Years of education: 16   Highest education level: Not on file  Occupational History    Occupation: retired  Tobacco Use   Smoking status: Former    Packs/day: 2.00    Years: 27.00    Pack years: 54.00    Types: Cigarettes    Quit date: 07/14/1983    Years since quitting: 37.6   Smokeless tobacco: Never  Substance and Sexual Activity   Alcohol use: Yes    Alcohol/week: 4.0 standard drinks    Types: 1 Glasses of wine, 1 Cans of beer, 1 Shots of liquor, 1 Standard drinks or equivalent per week    Comment: light use; rarely   Drug use: No   Sexual activity: Not on file  Other Topics Concern   Not on file  Social History Narrative   Patient lives at home with spouse.   Caffeine Use: 2 cups daily   Social Determinants of Health   Financial Resource Strain: Not on file  Food Insecurity: Not on file  Transportation Needs: Not on file  Physical Activity: Not on file  Stress: Not on file  Social Connections: Not on file  Intimate Partner Violence: Not on file    Family History  Problem Relation Age of Onset   Heart attack Mother    Hypertension Mother    Prostate cancer Father    Hypertension Father    Colon cancer Other    Diabetes Paternal Grandfather     ROS- All systems are reviewed and negative except as per the HPI above  Physical Exam: Vitals:   02/19/21 1355  BP: (!) 82/62  Pulse: 79  SpO2: 96%  Weight: 106.9 kg  Height: '5\' 10"'$  (1.778 m)   Wt Readings from Last 3 Encounters:  02/19/21 106.9 kg  02/04/21 107 kg  01/15/21 107.4 kg    Labs: Lab Results  Component Value Date   NA 139 02/02/2021   K 3.7 02/02/2021   CL 108 02/02/2021   CO2 24 02/02/2021   GLUCOSE 151 (H) 02/02/2021   BUN 13 02/02/2021   CREATININE 1.17 02/02/2021   CALCIUM 9.4 02/02/2021   MG 2.2 02/02/2021   Lab Results  Component Value Date   INR 1.43 07/11/2018   No results found for: CHOL, HDL, LDLCALC, TRIG   GEN- The patient is a well appearing obese elderly male, alert and oriented x 3 today.   HEENT-head normocephalic, atraumatic, sclera clear,  conjunctiva pink, hearing intact, trachea midline. Lungs- Clear to ausculation bilaterally, normal work of breathing Heart- irregular rate and rhythm, no murmurs, rubs or gallops  GI- soft, NT, ND, + BS Extremities- no clubbing, cyanosis, or edema MS- no significant deformity or atrophy Skin- no rash or lesion Psych- euthymic mood, full affect Neuro- strength and sensation  are intact   EKG-  Afib, RBBB Vent. rate 57 BPM PR interval * ms QRS duration 140 ms QT/QTcB 436/424 ms   Echo 2019-Left ventricle: The cavity size was normal. Wall thickness was    increased in a pattern of mild LVH. Systolic function was normal.    The estimated ejection fraction was in the range of 55% to 60%.    Wall motion was normal; there were no regional wall motion    abnormalities. There was no evidence of elevated ventricular    filling pressure by Doppler parameters.  - Aortic valve: Trileaflet; moderately thickened, moderately    calcified leaflets. Valve mobility was restricted. There was mild    regurgitation.  - Mitral valve: There was moderate regurgitation.  - Right ventricle: The cavity size was mildly dilated. Wall    thickness was normal.  - Right atrium: The atrium was mildly dilated.   Epic records reviewed   Assessment and Plan:  1. Paroxysmal Afib/atrial flutter Patient in rate controlled afib. Suspect he is going in and out of SR, afib, and atrial flutter. Recall he was in SR at his last visit 7/26. We discussed therapeutic options including continuing present therapy vs short term AAD as a bridge to ablation (Multaq, amio). Patient would like to continue PRN diltiazem for now. Discussed ED precautions, encouraged him to call clinic before going to ED if asymptomatic.  He has follow up with Dr Rayann Heman to be considered for an ablation.  Continue diltiazem at 360 mg daily with 30 mg PRN q 4 hours for heart racing.  2. CHA2DS2VASc  score of 3  Continue eliquis 5 mg BID  3.  HTN   Follow up with EP for ablation consideration.    [facility] [address] [place], [place] [address] [phone]

## 2021-02-19 NOTE — Telephone Encounter (Signed)
STAT if HR is under 50 or over 120 (normal HR is 60-100 beats per minute)  What is your heart rate? Before the medicine 150 and after he took diltiazem (CARDIZEM) 30 MG tablet and went 156  Do you have a log of your heart rate readings (document readings)? no  Do you have any other symptoms? no

## 2021-02-19 NOTE — Telephone Encounter (Signed)
Took call from the pt he states that about 2 hours ago his HR was 154. He took his diltiazem 30 mg fast acting and this did not help. His HR after was 155. He is asking if he can take another diltiazem, his BP is 104/92. He denies any sx, no CP or pressure, no headache, he is not feeling syncope.   Discussed with Dr Gardiner Rhyme and he states that pt cannot take another Diltiazem. Call Afib and see if they can get him in this week. Go to the ER with any new symptoms.  Called afbi clinic and they have opening at 2pm today arrive at 1:45 PM  Pt notified of above DOD message and AFIB appointment. Informed pt once again to go to the ER if he has ANY symptoms before this appointment. Verbalizes understanding and agrees

## 2021-03-12 ENCOUNTER — Encounter: Payer: Self-pay | Admitting: *Deleted

## 2021-03-12 ENCOUNTER — Ambulatory Visit (INDEPENDENT_AMBULATORY_CARE_PROVIDER_SITE_OTHER): Payer: Medicare Other | Admitting: Internal Medicine

## 2021-03-12 ENCOUNTER — Other Ambulatory Visit: Payer: Self-pay

## 2021-03-12 ENCOUNTER — Encounter: Payer: Self-pay | Admitting: Internal Medicine

## 2021-03-12 VITALS — BP 116/72 | HR 92 | Ht 70.0 in | Wt 236.8 lb

## 2021-03-12 DIAGNOSIS — D6869 Other thrombophilia: Secondary | ICD-10-CM | POA: Diagnosis not present

## 2021-03-12 DIAGNOSIS — I4819 Other persistent atrial fibrillation: Secondary | ICD-10-CM | POA: Diagnosis not present

## 2021-03-12 DIAGNOSIS — I10 Essential (primary) hypertension: Secondary | ICD-10-CM | POA: Diagnosis not present

## 2021-03-12 DIAGNOSIS — G4733 Obstructive sleep apnea (adult) (pediatric): Secondary | ICD-10-CM | POA: Diagnosis not present

## 2021-03-12 MED ORDER — METOPROLOL TARTRATE 25 MG PO TABS: 25.0000 mg | ORAL_TABLET | Freq: Once | ORAL | 0 refills | Status: DC | PRN

## 2021-03-12 NOTE — Patient Instructions (Addendum)
Medication Instructions:  Your physician recommends that you continue on your current medications as directed. Please refer to the Current Medication list given to you today.  Labwork: None ordered.  Testing/Procedures: Your physician has requested that you have an echocardiogram. Echocardiography is a painless test that uses sound waves to create images of your heart. It provides your doctor with information about the size and shape of your heart and how well your heart's chambers and valves are working. This procedure takes approximately one hour. There are no restrictions for this procedure.  Your physician has requested that you have cardiac CT. Cardiac computed tomography (CT) is a painless test that uses an x-ray machine to take clear, detailed pictures of your heart. For further information please visit [email or web address]. Please follow instruction sheet as given.   Your physician has recommended that you have an ablation. Catheter ablation is a medical procedure used to treat some cardiac arrhythmias (irregular heartbeats). During catheter ablation, a long, thin, flexible tube is put into a blood vessel in your groin (upper thigh), or neck. This tube is called an ablation catheter. It is then guided to your heart through the blood vessel. Radio frequency waves destroy small areas of heart tissue where abnormal heartbeats may cause an arrhythmia to start. Please see the instruction sheet given to you today.    Any Other Special Instructions Will Be Listed Below (If Applicable).  If you need a refill on your cardiac medications before your next appointment, please call your pharmacy.     Cardiac Ablation Cardiac ablation is a procedure to destroy (ablate) some heart tissue that is sending bad signals. These bad signals cause problems in heart rhythm. The heart has many areas that make these signals. If there are problems in these areas, they can make the heart beat in a way that is not  normal. Destroying some tissues can help make the heart rhythm normal. Tell your doctor about: Any allergies you have. All medicines you are taking. These include vitamins, herbs, eye drops, creams, and over-the-counter medicines. Any problems you or family members have had with medicines that make you fall asleep (anesthetics). Any blood disorders you have. Any surgeries you have had. Any medical conditions you have, such as kidney failure. Whether you are pregnant or may be pregnant. What are the risks? This is a safe procedure. But problems may occur, including: Infection. Bruising and bleeding. Bleeding into the chest. Stroke or blood clots. Damage to nearby areas of your body. Allergies to medicines or dyes. The need for a pacemaker if the normal system is damaged. Failure of the procedure to treat the problem. What happens before the procedure? Medicines Ask your doctor about: Changing or stopping your normal medicines. This is important. Taking aspirin and ibuprofen. Do not take these medicines unless your doctor tells you to take them. Taking other medicines, vitamins, herbs, and supplements. General instructions Follow instructions from your doctor about what you cannot eat or drink. Plan to have someone take you home from the hospital or clinic. If you will be going home right after the procedure, plan to have someone with you for 24 hours. Ask your doctor what steps will be taken to prevent infection. What happens during the procedure?  An IV tube will be put into one of your veins. You will be given a medicine to help you relax. The skin on your neck or groin will be numbed. A cut (incision) will be made in your neck or groin. A needle  will be put through your cut and into a large vein. A tube (catheter) will be put into the needle. The tube will be moved to your heart. Dye may be put through the tube. This helps your doctor see your heart. Small devices (electrodes)  on the tube will send out signals. A type of energy will be used to destroy some heart tissue. The tube will be taken out. Pressure will be held on your cut. This helps stop bleeding. A bandage will be put over your cut. The exact procedure may vary among doctors and hospitals. What happens after the procedure? You will be watched until you leave the hospital or clinic. This includes checking your heart rate, breathing rate, oxygen, and blood pressure. Your cut will be watched for bleeding. You will need to lie still for a few hours. Do not drive for 24 hours or as long as your doctor tells you. Summary Cardiac ablation is a procedure to destroy some heart tissue. This is done to treat heart rhythm problems. Tell your doctor about any medical conditions you may have. Tell him or her about all medicines you are taking to treat them. This is a safe procedure. But problems may occur. These include infection, bruising, bleeding, and damage to nearby areas of your body. Follow what your doctor tells you about food and drink. You may also be told to change or stop some of your medicines. After the procedure, do not drive for 24 hours or as long as your doctor tells you. This information is not intended to replace advice given to you by your health care provider. Make sure you discuss any questions you have with your health care provider. Document Revised: 06/01/2019 Document Reviewed: 06/01/2019 Elsevier Patient Education  2022 [employer].

## 2021-03-12 NOTE — Progress Notes (Signed)
Electrophysiology Office Note   Date:  03/12/2021   ID:  Charles Cameron, DOB 04/12/1946, MRN [id number]  PCP:  Charles Cameron, Charles Grief, MD  Cardiologist:  Dr Harrell Gave Primary Electrophysiologist: Thompson Grayer, MD    CC: afib   History of Present Illness: Charles Cameron is a 75 y.o. male who presents today for electrophysiology evaluation.   He is referred by Dr Harrell Gave and Adline Peals for EP consultation. The patient was initially diagnosed with afib in the setting of pneumonia 07/2019.  He converted to sinus prior to discharge.  He returned to afib in June, 2021.  He has had increasing afib since that time.  Rate control has been difficult.  + palpitations and fatigue with afib.  + SOB with activity.  He also has a history of atrial flutter.  Today, he denies symptoms of   chest pain, shortness of breath, orthopnea, PND, lower extremity edema, claudication, dizziness, presyncope, syncope, bleeding, or neurologic sequela. The patient is tolerating medications without difficulties and is otherwise without complaint today.    Past Medical History:  Diagnosis Date   Anticoagulation adequate 07/14/2018   Cancer ([place])    Edema 07/14/2018   Hypertension    Prostate cancer ([place]) 2016   URI (upper respiratory infection) 07/14/2018   Past Surgical History:  Procedure Laterality Date   LUMBAR LAMINECTOMY  1989     Current Outpatient Medications  Medication Sig Dispense Refill   apixaban (ELIQUIS) 5 MG TABS tablet Take 1 tablet (5 mg total) by mouth 2 (two) times daily. 60 tablet 3   Cyanocobalamin (VITAMIN B-12) 5000 MCG SUBL Place 1 tablet under the tongue daily in the afternoon.     diltiazem (CARDIZEM CD) 360 MG 24 hr capsule TAKE 1 CAPSULE BY MOUTH EVERY DAY 90 capsule 3   diltiazem (CARDIZEM) 30 MG tablet Take 1 tablet (30 mg total) by mouth 4 (four) times daily as needed (tachycardia). 15 tablet 0   furosemide (LASIX) 20 MG tablet Take 1 tablet daily as needed for weight  gain/swelling 20 tablet 1   GLUCOSAMINE-CHONDROITIN PO Take 1 tablet by mouth 3 (three) times daily. Taking '1500mg'$  by mouth daily     levocetirizine (XYZAL) 5 MG tablet Take 5 mg by mouth every evening.     loratadine (CLARITIN) 10 MG tablet Take 10 mg by mouth every morning.     montelukast (SINGULAIR) 10 MG tablet Take 1 tablet (10 mg total) by mouth at bedtime. 90 tablet 3   Multiple Vitamin (MULTIVITAMIN) tablet Take 1 tablet by mouth daily.     Omega-3 Fatty Acids (FISH OIL PO) Take 1,400 mg by mouth daily.     potassium chloride (KLOR-CON) 20 MEQ tablet Take 1 tablet daily as needed with lasix use 20 tablet 1   No current facility-administered medications for this visit.    Allergies:   Patient has no known allergies.   Social History:  The patient  reports that he quit smoking about 37 years ago. His smoking use included cigarettes. He has a 54.00 pack-year smoking history. He has never used smokeless tobacco. He reports current alcohol use of about 4.0 standard drinks per week. He reports that he does not use drugs.   Family History:  The patient's family history includes Colon cancer in an other family member; Diabetes in his paternal grandfather; Heart attack in his mother; Hypertension in his father and mother; Prostate cancer in his father.    ROS:  Please see the history of  present illness.   All other systems are personally reviewed and negative.    PHYSICAL EXAM: VS:  BP 116/72   Pulse 92   Ht '5\' 10"'$  (1.778 m)   Wt 236 lb 12.8 oz (107.4 kg)   SpO2 97%   BMI 33.98 kg/m  , BMI Body mass index is 33.98 kg/m. GEN: Well nourished, well developed, in no acute distress HEENT: normal Neck: no JVD, carotid bruits, or masses Cardiac: iRRR; no murmurs, rubs, or gallops,no edema  Respiratory:  clear to auscultation bilaterally, normal work of breathing GI: soft, nontender, nondistended, + BS MS: no deformity or atrophy Skin: warm and dry  Neuro:  Strength and sensation are  intact Psych: euthymic mood, full affect  EKG:  EKG is ordered today. The ekg ordered today is personally reviewed and shows afib, RBBB   Recent Labs: 02/02/2021: ALT 36; BUN 13; Creatinine, Ser 1.17; Hemoglobin 16.9; Magnesium 2.2; Platelets 190; Potassium 3.7; Sodium 139  personally reviewed   Lipid Panel  No results found for: CHOL, TRIG, HDL, CHOLHDL, VLDL, LDLCALC, LDLDIRECT personally reviewed   Wt Readings from Last 3 Encounters:  03/12/21 236 lb 12.8 oz (107.4 kg)  02/19/21 235 lb 9.6 oz (106.9 kg)  02/04/21 235 lb 12.8 oz (107 kg)    Echo 07/11/18-  EF 55%, mild lVH, moderate MR, la 21m   Other studies personally reviewed: Additional studies/ records that were reviewed today include: AF clinic notes, prior echo  Review of the above records today demonstrates: as above   ASSESSMENT AND PLAN:  1.  Persistent afib, atrial flutter The patient has symptomatic, recurrent atrial fibrillation and atrial flutter. Chads2vasc score is 3.  he is anticoagulated with eliquis . Therapeutic strategies for afib including medicine (flecainide, multaq, tikosyn,) and ablation were discussed in detail with the patient today. Risk, benefits, and alternatives to EP study and radiofrequency ablation for afib were also discussed in detail today. These risks include but are not limited to stroke, bleeding, vascular damage, tamponade, perforation, damage to the esophagus, lungs, and other structures, pulmonary vein stenosis, worsening renal function, and death. The patient understands these risk and wishes to proceed.  We will therefore proceed with catheter ablation at the next available time.  Carto, ICE, anesthesia are requested for the procedure.  Will also obtain cardiac CT prior to the procedure to exclude LAA thrombus and further evaluate atrial anatomy.  Echo to evaluate for any structural changes related to afib  2. HTN Stable No change required today  3. OSA Not compliant with  CPAP  Risks, benefits and potential toxicities for medications prescribed and/or refilled reviewed with patient today.     SArmy Fossa MD  03/12/2021 10:11 AM     [facility] [address] [phone](office) ([phone](fax)

## 2021-03-21 ENCOUNTER — Ambulatory Visit (HOSPITAL_COMMUNITY): Payer: Medicare Other | Attending: Internal Medicine

## 2021-03-21 ENCOUNTER — Other Ambulatory Visit: Payer: Self-pay

## 2021-03-21 DIAGNOSIS — I10 Essential (primary) hypertension: Secondary | ICD-10-CM | POA: Diagnosis present

## 2021-03-21 DIAGNOSIS — D6869 Other thrombophilia: Secondary | ICD-10-CM | POA: Insufficient documentation

## 2021-03-21 DIAGNOSIS — G4733 Obstructive sleep apnea (adult) (pediatric): Secondary | ICD-10-CM | POA: Insufficient documentation

## 2021-03-21 DIAGNOSIS — I4819 Other persistent atrial fibrillation: Secondary | ICD-10-CM | POA: Diagnosis present

## 2021-03-21 LAB — ECHOCARDIOGRAM COMPLETE
P 1/2 time: 635 msec
S' Lateral: 1.5 cm

## 2021-03-27 NOTE — Progress Notes (Signed)
Cardiology Office Note:    Date:  03/28/2021   ID:  Charles Cameron, DOB 1946-04-08, MRN [id number]  PCP:  Patrecia Pour, Christean Grief, MD  Cardiologist:  Buford Dresser, MD PhD  Referring MD: Thornton Dales I, MD   CC: follow up  History of Present Illness:    Charles Cameron is a 75 y.o. male with a hx of pneumonia 06/2018-07/2018 with atrial flutter with paroxysmal atrial fibrillation diagnosed at the same time. He is seen for follow up today.  Cardiac history: No prior history until 06/2018, when he developed URI and atrial flutter/atrial fibrillation. He had intermittent RVR while in the hospital, requiring multiple medications for management. He was initially on dronedarone (stopped after 30 days), diltiazem, and metoprolol. Had volume overload with arrhythmia, improved with lasix. EF was normal. Afib has recurred, planned for ablation 04/2021.  Today: He is accompanied by his spouse today.  He is scheduled for an ablation soon. Since his last visit he has not discovered any triggers for his atrial fibrillation. Every once in a while he feels "off" and has associated fatigue.  On occasion he reports having mild LE edema.  About 1-2 weeks ago his blood pressure spiked to 150s/90s, but this is not typical. He denies any low blood pressure readings.  We reviewed the results of his echocardiogram (03/2021) in detail. Severe dilation of the left atrium was noted.    For exercise, he is considering hiring a Physiological scientist. He is a member of [employer], and tries to do cardio exercises three times a week. Also does yoga about twice a week.  A week ago he began wearing a Fit-Bit which is showing irregular sleep patterns. However, he does not believe the recordings are accurate. He typically feels well rested in the morning. Previously he had a sleep study positive for sleep apnea. For a few months he tried a CPAP, but was unable to tolerate it and he discontinued use.  He denies any  chest pain, or shortness of breath. No lightheadedness, headaches, syncope, orthopnea, or PND. Also has no exertional symptoms. No hematuria or hematochezia.   Past Medical History:  Diagnosis Date   Anticoagulation adequate 07/14/2018   Edema 07/14/2018   Hypertension    OSA (obstructive sleep apnea)    not tolerant of CPAP   Persistent atrial fibrillation ([place])    Prostate cancer ([place]) 2016   Skin Cancer    URI (upper respiratory infection) 07/14/2018    Past Surgical History:  Procedure Laterality Date   LUMBAR LAMINECTOMY  07/14/1987   PROSTATECTOMY      Current Medications: Current Outpatient Medications on File Prior to Visit  Medication Sig   apixaban (ELIQUIS) 5 MG TABS tablet Take 1 tablet (5 mg total) by mouth 2 (two) times daily.   Cyanocobalamin (VITAMIN B-12) 5000 MCG SUBL Place 1 tablet under the tongue daily in the afternoon.   diltiazem (CARDIZEM CD) 360 MG 24 hr capsule TAKE 1 CAPSULE BY MOUTH EVERY DAY   diltiazem (CARDIZEM) 30 MG tablet Take 1 tablet (30 mg total) by mouth 4 (four) times daily as needed (tachycardia).   furosemide (LASIX) 20 MG tablet Take 1 tablet daily as needed for weight gain/swelling   GLUCOSAMINE-CHONDROITIN PO Take 1 tablet by mouth 3 (three) times daily. Taking '1500mg'$  by mouth daily   levocetirizine (XYZAL) 5 MG tablet Take 5 mg by mouth every evening.   loratadine (CLARITIN) 10 MG tablet Take 10 mg by mouth every morning.  metoprolol tartrate (LOPRESSOR) 25 MG tablet Take 1 tablet (25 mg total) by mouth Once PRN for up to 1 dose (IF your heart rate is over 75 TAKE two hours before CT scan).   montelukast (SINGULAIR) 10 MG tablet Take 1 tablet (10 mg total) by mouth at bedtime.   Multiple Vitamin (MULTIVITAMIN) tablet Take 1 tablet by mouth daily.   Omega-3 Fatty Acids (FISH OIL PO) Take 1,400 mg by mouth daily.   potassium chloride (KLOR-CON) 20 MEQ tablet Take 1 tablet daily as needed with lasix use   No current  facility-administered medications on file prior to visit.     Allergies:   Patient has no known allergies.   Social History   Tobacco Use   Smoking status: Former    Packs/day: 2.00    Years: 27.00    Pack years: 54.00    Types: Cigarettes    Quit date: 07/14/1983    Years since quitting: 37.7   Smokeless tobacco: Never  Substance Use Topics   Alcohol use: Yes    Alcohol/week: 4.0 standard drinks    Types: 1 Glasses of wine, 1 Cans of beer, 1 Shots of liquor, 1 Standard drinks or equivalent per week    Comment: light use; rarely   Drug use: No    Family History: The patient's family history includes Colon cancer in an other family member; Diabetes in his paternal grandfather; Heart attack in his mother; Hypertension in his father and mother; Prostate cancer in his father.  ROS:   Please see the history of present illness.   (+) Fatigue (+) Bilateral LE edema Additional pertinent ROS otherwise unremarkable  EKGs/Labs/Other Studies Reviewed:    The following studies were reviewed today:  Echo 03/21/2021: 1. Left ventricular ejection fraction, by estimation, is 70 to 75%. The  left ventricle has hyperdynamic function. The left ventricle has no  regional wall motion abnormalities. There is mild left ventricular  hypertrophy. Left ventricular diastolic  function could not be evaluated.   2. Right ventricular systolic function is mildly reduced. The right  ventricular size is mildly enlarged. The estimated right ventricular  systolic pressure is 123XX123 mmHg.   3. Left atrial size was severely dilated.   4. Right atrial size was mildly dilated.   5. The mitral valve is grossly normal. Mild mitral valve regurgitation.   6. The aortic valve is tricuspid. Aortic valve regurgitation is mild.  Mild to moderate aortic valve sclerosis/calcification is present, without  any evidence of aortic stenosis.   7. Aortic dilatation noted. There is borderline dilatation of the aortic  root,  measuring 39 mm. There is mild dilatation of the ascending aorta,  measuring 41 mm.   8. The inferior vena cava is normal in size with greater than 50%  respiratory variability, suggesting right atrial pressure of 3 mmHg.   Comparison(s): Changes from prior study are noted. 07/11/2018: LVEF  55-60%, mild RAE, mildly dilated RV, moderate MR.   AAA screening 09/17/2020 ([place]): COMPARISON:  None.   FINDINGS:  Abdominal aortic measurements as follows:   Proximal:  32 cm   Mid:  28 cm   Distal:  23 cm   IMPRESSION:  Ectasia of the proximal abdominal aorta. Recommend follow-up  ultrasound every 3 years. This recommendation follows ACR consensus  guidelines: White Paper of the ACR Incidental Findings Committee II  on Vascular Findings. J Am Coll Radiol 2013; 10:789-794.   Echo 07/11/18 - Left ventricle: The cavity size was normal. Wall thickness  was   increased in a pattern of mild LVH. Systolic function was normal.   The estimated ejection fraction was in the range of 55% to 60%.   Wall motion was normal; there were no regional wall motion   abnormalities. There was no evidence of elevated ventricular   filling pressure by Doppler parameters. - Aortic valve: Trileaflet; moderately thickened, moderately   calcified leaflets. Valve mobility was restricted. There was mild   regurgitation. - Mitral valve: There was moderate regurgitation. - Right ventricle: The cavity size was mildly dilated. Wall   thickness was normal. - Right atrium: The atrium was mildly dilated.  EKG:  EKG is personally reviewed.   03/28/2021: Atrial fibrillation, RBBB at 89 bpm 04/02/2020: sinus bradycardia at 58 bpm with RBBB  Recent Labs: 02/02/2021: ALT 36; BUN 13; Creatinine, Ser 1.17; Hemoglobin 16.9; Magnesium 2.2; Platelets 190; Potassium 3.7; Sodium 139  Recent Lipid Panel No results found for: CHOL, TRIG, HDL, CHOLHDL, VLDL, LDLCALC, LDLDIRECT  Physical Exam:    VS:  BP 118/72   Pulse 89   Ht 5'  10" (1.778 m)   Wt 237 lb 14.4 oz (107.9 kg)   BMI 34.14 kg/m     Wt Readings from Last 3 Encounters:  03/28/21 237 lb 14.4 oz (107.9 kg)  03/12/21 236 lb 12.8 oz (107.4 kg)  02/19/21 235 lb 9.6 oz (106.9 kg)    GEN: Well nourished, well developed in no acute distress HEENT: Normal, moist mucous membranes NECK: No JVD CARDIAC: irregularly irregular rhythm, normal S1 and S2, no rubs or gallops. No murmur. VASCULAR: Radial and DP pulses 2+ bilaterally. No carotid bruits RESPIRATORY:  Clear to auscultation without rales, wheezing or rhonchi  ABDOMEN: Soft, non-tender, non-distended MUSCULOSKELETAL:  Ambulates independently SKIN: Warm and dry, no edema NEUROLOGIC:  Alert and oriented x 3. No focal neuro deficits noted. PSYCHIATRIC:  Normal affect    ASSESSMENT:    1. Persistent atrial fibrillation ([place])   2. Acquired thrombophilia ([place])   3. Secondary hypercoagulable state ([place])   4. Essential hypertension   5. Encounter to discuss test results    PLAN:    History of paroxysmal Atrial flutter (initially typical, then atypical), also with paroxysmal atrial fibrillation during hospitalization 07/2018 Now with persistent atrial fibrillation, pending ablation 04/2021 -tolerating apixaban without hematuria/hematochezia/melena -rate control currently on diltiazem standing and PRN, pending ablation -reviewed recent echo, EF 70-75%, moderate MR, concern for severe LA dilation. Reviewed today. -CHA2DS2/VAS Stroke Risk Points= 3, on anticoagulation for secondary hypercoagulable state    Hypertension: well controlled -continue diltiazem  CV risk counseling and prevention -recommend heart healthy/Mediterranean diet, with whole grains, fruits, vegetable, fish, lean meats, nuts, and olive oil. Limit salt. -recommend moderate walking, 3-5 times/week for 30-50 minutes each session. Aim for at least 150 minutes.week. Goal should be pace of 3 miles/hours, or walking 1.5 miles in 30  minutes -recommend avoidance of tobacco products. Avoid excess alcohol. -ASCVD risk score: The 10-year ASCVD risk score (Arnett DK, et al., 2019) is: 27.3%   Values used to calculate the score:     Age: 22 years     Sex: Male     Is Non-Hispanic African American: No     Diabetic: No     Tobacco smoker: No     Systolic Blood Pressure: 123456 mmHg     Is BP treated: Yes     HDL Cholesterol: 48 MG/DL     Total Cholesterol: 213 MG/DL  -given elevated ASCVD risk, have  discussed statin.  Plan for follow up: 6 months or sooner PRN  Buford Dresser, MD, PhD, [facility]   Medication Adjustments/Labs and Tests Ordered: Current medicines are reviewed at length with the patient today.  Concerns regarding medicines are outlined above.   Orders Placed This Encounter  Procedures   EKG 12-Lead    No orders of the defined types were placed in this encounter.  Patient Instructions  Medication Instructions:  Continue current medications  *If you need a refill on your cardiac medications before your next appointment, please call your pharmacy*   Lab Work: None Ordered   Testing/Procedures: None Ordered   Follow-Up: At [facility], you and your health needs are our priority.  As part of our continuing mission to provide you with exceptional heart care, we have created designated Provider Care Teams.  These Care Teams include your primary Cardiologist (physician) and Advanced Practice Providers (APPs -  Physician Assistants and Nurse Practitioners) who all work together to provide you with the care you need, when you need it.  We recommend signing up for the patient portal called "MyChart".  Sign up information is provided on this After Visit Summary.  MyChart is used to connect with patients for Virtual Visits (Telemedicine).  Patients are able to view lab/test results, encounter notes, upcoming appointments, etc.  Non-urgent messages can be sent to your  provider as well.   To learn more about what you can do with MyChart, go to [email or web address].    Your next appointment:   6 month(s)  The format for your next appointment:   In Person  Provider:   Buford Dresser, MD  Signed,  I,Mathew Stumpf,acting as a scribe for Buford Dresser, MD.,have documented all relevant documentation on the behalf of Buford Dresser, MD,as directed by  Buford Dresser, MD while in the presence of Buford Dresser, MD.  I, Buford Dresser, MD, have reviewed all documentation for this visit. The documentation on 03/28/21 for the exam, diagnosis, procedures, and orders are all accurate and complete.   Buford Dresser, MD PhD 03/28/2021    [place]

## 2021-03-28 ENCOUNTER — Other Ambulatory Visit: Payer: Self-pay

## 2021-03-28 ENCOUNTER — Encounter (HOSPITAL_BASED_OUTPATIENT_CLINIC_OR_DEPARTMENT_OTHER): Payer: Self-pay | Admitting: Cardiology

## 2021-03-28 ENCOUNTER — Ambulatory Visit (INDEPENDENT_AMBULATORY_CARE_PROVIDER_SITE_OTHER): Payer: Medicare Other | Admitting: Cardiology

## 2021-03-28 VITALS — BP 118/72 | HR 89 | Ht 70.0 in | Wt 237.9 lb

## 2021-03-28 DIAGNOSIS — D6869 Other thrombophilia: Secondary | ICD-10-CM

## 2021-03-28 DIAGNOSIS — I4819 Other persistent atrial fibrillation: Secondary | ICD-10-CM | POA: Diagnosis not present

## 2021-03-28 DIAGNOSIS — I10 Essential (primary) hypertension: Secondary | ICD-10-CM | POA: Diagnosis not present

## 2021-03-28 DIAGNOSIS — Z712 Person consulting for explanation of examination or test findings: Secondary | ICD-10-CM | POA: Diagnosis not present

## 2021-03-28 NOTE — Patient Instructions (Signed)
Medication Instructions:  Continue current medications  *If you need a refill on your cardiac medications before your next appointment, please call your pharmacy*   Lab Work: None Ordered   Testing/Procedures: None Ordered   Follow-Up: At [facility], you and your health needs are our priority.  As part of our continuing mission to provide you with exceptional heart care, we have created designated Provider Care Teams.  These Care Teams include your primary Cardiologist (physician) and Advanced Practice Providers (APPs -  Physician Assistants and Nurse Practitioners) who all work together to provide you with the care you need, when you need it.  We recommend signing up for the patient portal called "MyChart".  Sign up information is provided on this After Visit Summary.  MyChart is used to connect with patients for Virtual Visits (Telemedicine).  Patients are able to view lab/test results, encounter notes, upcoming appointments, etc.  Non-urgent messages can be sent to your provider as well.   To learn more about what you can do with MyChart, go to [email or web address].    Your next appointment:   6 month(s)  The format for your next appointment:   In Person  Provider:   [name], MD              

## 2021-04-08 ENCOUNTER — Other Ambulatory Visit: Payer: Medicare Other

## 2021-04-08 ENCOUNTER — Other Ambulatory Visit: Payer: Self-pay

## 2021-04-08 ENCOUNTER — Other Ambulatory Visit (HOSPITAL_BASED_OUTPATIENT_CLINIC_OR_DEPARTMENT_OTHER): Payer: Self-pay | Admitting: Cardiology

## 2021-04-08 DIAGNOSIS — I4819 Other persistent atrial fibrillation: Secondary | ICD-10-CM

## 2021-04-08 LAB — CBC WITH DIFFERENTIAL/PLATELET
Basophils Absolute: 0.1 10*3/uL (ref 0.0–0.2)
Basos: 1 %
EOS (ABSOLUTE): 0.2 10*3/uL (ref 0.0–0.4)
Eos: 3 %
Hematocrit: 48.1 % (ref 37.5–51.0)
Hemoglobin: 16.6 g/dL (ref 13.0–17.7)
Immature Grans (Abs): 0 10*3/uL (ref 0.0–0.1)
Immature Granulocytes: 0 %
Lymphocytes Absolute: 1.7 10*3/uL (ref 0.7–3.1)
Lymphs: 28 %
MCH: 32.1 pg (ref 26.6–33.0)
MCHC: 34.5 g/dL (ref 31.5–35.7)
MCV: 93 fL (ref 79–97)
Monocytes Absolute: 0.7 10*3/uL (ref 0.1–0.9)
Monocytes: 11 %
Neutrophils Absolute: 3.4 10*3/uL (ref 1.4–7.0)
Neutrophils: 57 %
Platelets: 190 10*3/uL (ref 150–450)
RBC: 5.17 x10E6/uL (ref 4.14–5.80)
RDW: 12 % (ref 11.6–15.4)
WBC: 6.1 10*3/uL (ref 3.4–10.8)

## 2021-04-08 LAB — BASIC METABOLIC PANEL
BUN/Creatinine Ratio: 13 (ref 10–24)
BUN: 15 mg/dL (ref 8–27)
CO2: 25 mmol/L (ref 20–29)
Calcium: 9.6 mg/dL (ref 8.6–10.2)
Chloride: 103 mmol/L (ref 96–106)
Creatinine, Ser: 1.17 mg/dL (ref 0.76–1.27)
Glucose: 161 mg/dL — ABNORMAL HIGH (ref 70–99)
Potassium: 4 mmol/L (ref 3.5–5.2)
Sodium: 141 mmol/L (ref 134–144)
eGFR: 65 mL/min/{1.73_m2} (ref >59)

## 2021-04-21 ENCOUNTER — Telehealth (HOSPITAL_COMMUNITY): Payer: Self-pay | Admitting: Emergency Medicine

## 2021-04-21 NOTE — Telephone Encounter (Signed)
Attempted to call patient regarding upcoming cardiac CT appointment. °Left message on voicemail with name and callback number °Manford Sprong RN Navigator Cardiac Imaging °[facility] °[phone] Office °[phone] Cell ° °

## 2021-04-21 NOTE — Telephone Encounter (Signed)
Pt returning phone call regarding upcoming cardiac imaging study; pt verbalizes understanding of appt date/time, parking situation and where to check in, pre-test NPO status and medications ordered, and verified current allergies; name and call back number provided for further questions should they arise Marchia Bond RN Navigator Cardiac Imaging [facility] [phone] office [phone] cell   25mg  metop if HR >  75 bpm  (pt will check HR at 6am)

## 2021-04-23 ENCOUNTER — Other Ambulatory Visit: Payer: Self-pay

## 2021-04-23 ENCOUNTER — Ambulatory Visit (HOSPITAL_COMMUNITY)
Admission: RE | Admit: 2021-04-23 | Discharge: 2021-04-23 | Disposition: A | Discharge status: Home / Self Care | Payer: Medicare Other | Source: Ambulatory Visit | Attending: Internal Medicine | Admitting: Internal Medicine

## 2021-04-23 DIAGNOSIS — I4819 Other persistent atrial fibrillation: Secondary | ICD-10-CM | POA: Diagnosis present

## 2021-04-23 MED ORDER — IOHEXOL 350 MG/ML SOLN
80.0000 mL | Freq: Once | INTRAVENOUS | Status: AC | PRN
  Administered 2021-04-23: 80 mL via INTRAVENOUS

## 2021-04-29 NOTE — Anesthesia Preprocedure Evaluation (Addendum)
Anesthesia Evaluation  Patient identified by MRN, date of birth, ID band Patient awake    Reviewed: Allergy & Precautions, H&P , NPO status , Patient's Chart, lab work & pertinent test results, reviewed documented beta blocker date and time   Airway Mallampati: III  TM Distance: >3 FB Neck ROM: Full    Dental no notable dental hx. (+) Teeth Intact, Dental Advisory Given   Pulmonary sleep apnea , COPD, former smoker,    Pulmonary exam normal breath sounds clear to auscultation       Cardiovascular Exercise Tolerance: Good hypertension, Pt. on medications and Pt. on home beta blockers + dysrhythmias Atrial Fibrillation  Rhythm:Irregular Rate:Normal     Neuro/Psych negative neurological ROS  negative psych ROS   GI/Hepatic negative GI ROS, Neg liver ROS,   Endo/Other  negative endocrine ROS  Renal/GU negative Renal ROS  negative genitourinary   Musculoskeletal   Abdominal   Peds  Hematology negative hematology ROS (+)   Anesthesia Other Findings   Reproductive/Obstetrics negative OB ROS                            Anesthesia Physical Anesthesia Plan  ASA: 3  Anesthesia Plan: General   Post-op Pain Management:    Induction: Intravenous  PONV Risk Score and Plan: 3 and Ondansetron, Dexamethasone and Midazolam  Airway Management Planned: Oral ETT  Additional Equipment:   Intra-op Plan:   Post-operative Plan: Extubation in OR  Informed Consent: I have reviewed the patients History and Physical, chart, labs and discussed the procedure including the risks, benefits and alternatives for the proposed anesthesia with the patient or authorized representative who has indicated his/her understanding and acceptance.     Dental advisory given  Plan Discussed with: CRNA  Anesthesia Plan Comments:        Anesthesia Quick Evaluation

## 2021-04-29 NOTE — Pre-Procedure Instructions (Signed)
Instructed patient on the following items: Arrival time 0630 Nothing to eat or drink after midnight No meds AM of procedure Responsible person to drive you home and stay with you for 24 hrs  Have you missed any doses of anti-coagulant Eliquis- hasn't missed

## 2021-04-30 ENCOUNTER — Encounter (HOSPITAL_COMMUNITY)
Admission: RE | Disposition: A | Discharge status: Home / Self Care | Payer: Self-pay | Source: Home / Self Care | Attending: Internal Medicine

## 2021-04-30 ENCOUNTER — Ambulatory Visit (HOSPITAL_COMMUNITY)
Admission: RE | Admit: 2021-04-30 | Discharge: 2021-04-30 | Disposition: A | Discharge status: Home / Self Care | Payer: Medicare Other | Attending: Internal Medicine | Admitting: Internal Medicine

## 2021-04-30 ENCOUNTER — Ambulatory Visit (HOSPITAL_COMMUNITY): Payer: Medicare Other | Admitting: Anesthesiology

## 2021-04-30 ENCOUNTER — Other Ambulatory Visit: Payer: Self-pay

## 2021-04-30 DIAGNOSIS — I4892 Unspecified atrial flutter: Secondary | ICD-10-CM | POA: Diagnosis not present

## 2021-04-30 DIAGNOSIS — I4819 Other persistent atrial fibrillation: Secondary | ICD-10-CM | POA: Diagnosis present

## 2021-04-30 DIAGNOSIS — Z7901 Long term (current) use of anticoagulants: Secondary | ICD-10-CM | POA: Diagnosis not present

## 2021-04-30 DIAGNOSIS — Z8249 Family history of ischemic heart disease and other diseases of the circulatory system: Secondary | ICD-10-CM | POA: Diagnosis not present

## 2021-04-30 DIAGNOSIS — Z79899 Other long term (current) drug therapy: Secondary | ICD-10-CM | POA: Insufficient documentation

## 2021-04-30 DIAGNOSIS — Z87891 Personal history of nicotine dependence: Secondary | ICD-10-CM | POA: Insufficient documentation

## 2021-04-30 HISTORY — PX: ATRIAL FIBRILLATION ABLATION: EP1191

## 2021-04-30 SURGERY — ATRIAL FIBRILLATION ABLATION
Anesthesia: General

## 2021-04-30 MED ORDER — PANTOPRAZOLE SODIUM 40 MG PO TBEC: 40.0000 mg | DELAYED_RELEASE_TABLET | Freq: Every day | ORAL | 0 refills | Status: DC

## 2021-04-30 MED ORDER — HEPARIN SODIUM (PORCINE) 1000 UNIT/ML IJ SOLN
INTRAMUSCULAR | Status: DC | PRN
  Administered 2021-04-30: 15000 [IU] via INTRAVENOUS
  Administered 2021-04-30: 1000 [IU] via INTRAVENOUS

## 2021-04-30 MED ORDER — FENTANYL CITRATE (PF) 250 MCG/5ML IJ SOLN
INTRAMUSCULAR | Status: DC | PRN
  Administered 2021-04-30: 50 ug via INTRAVENOUS

## 2021-04-30 MED ORDER — ACETAMINOPHEN 500 MG PO TABS
1000.0000 mg | ORAL_TABLET | Freq: Once | ORAL | Status: AC
  Administered 2021-04-30: 1000 mg via ORAL
  Filled 2021-04-30: qty 2

## 2021-04-30 MED ORDER — HEPARIN (PORCINE) IN NACL 1000-0.9 UT/500ML-% IV SOLN
INTRAVENOUS | Status: AC
  Filled 2021-04-30: qty 500

## 2021-04-30 MED ORDER — SODIUM CHLORIDE 0.9% FLUSH: 3.0000 mL | INTRAVENOUS | Status: DC | PRN

## 2021-04-30 MED ORDER — SUGAMMADEX SODIUM 200 MG/2ML IV SOLN
INTRAVENOUS | Status: DC | PRN
  Administered 2021-04-30: 200 mg via INTRAVENOUS

## 2021-04-30 MED ORDER — PROPOFOL 10 MG/ML IV BOLUS
INTRAVENOUS | Status: DC | PRN
  Administered 2021-04-30: 150 mg via INTRAVENOUS

## 2021-04-30 MED ORDER — HEPARIN (PORCINE) IN NACL 1000-0.9 UT/500ML-% IV SOLN
INTRAVENOUS | Status: DC | PRN
  Administered 2021-04-30 (×3): 500 mL

## 2021-04-30 MED ORDER — SODIUM CHLORIDE 0.9 % IV SOLN: 250.0000 mL | INTRAVENOUS | Status: DC | PRN

## 2021-04-30 MED ORDER — SODIUM CHLORIDE 0.9 % IV SOLN: INTRAVENOUS | Status: DC

## 2021-04-30 MED ORDER — HEPARIN SODIUM (PORCINE) 1000 UNIT/ML IJ SOLN
INTRAMUSCULAR | Status: DC | PRN
  Administered 2021-04-30: 2000 [IU] via INTRAVENOUS
  Administered 2021-04-30: 3000 [IU] via INTRAVENOUS

## 2021-04-30 MED ORDER — ACETAMINOPHEN 325 MG PO TABS
650.0000 mg | ORAL_TABLET | ORAL | Status: DC | PRN
  Filled 2021-04-30: qty 2

## 2021-04-30 MED ORDER — ROCURONIUM BROMIDE 10 MG/ML (PF) SYRINGE
PREFILLED_SYRINGE | INTRAVENOUS | Status: DC | PRN
  Administered 2021-04-30: 20 mg via INTRAVENOUS
  Administered 2021-04-30: 50 mg via INTRAVENOUS

## 2021-04-30 MED ORDER — LIDOCAINE 2% (20 MG/ML) 5 ML SYRINGE
INTRAMUSCULAR | Status: DC | PRN
  Administered 2021-04-30: 60 mg via INTRAVENOUS

## 2021-04-30 MED ORDER — ONDANSETRON HCL 4 MG/2ML IJ SOLN
INTRAMUSCULAR | Status: DC | PRN
  Administered 2021-04-30: 4 mg via INTRAVENOUS

## 2021-04-30 MED ORDER — APIXABAN 5 MG PO TABS
5.0000 mg | ORAL_TABLET | Freq: Once | ORAL | Status: AC
  Administered 2021-04-30: 5 mg via ORAL
  Filled 2021-04-30: qty 1

## 2021-04-30 MED ORDER — DEXAMETHASONE SODIUM PHOSPHATE 10 MG/ML IJ SOLN
INTRAMUSCULAR | Status: DC | PRN
  Administered 2021-04-30: 10 mg via INTRAVENOUS

## 2021-04-30 MED ORDER — ONDANSETRON HCL 4 MG/2ML IJ SOLN: 4.0000 mg | Freq: Four times a day (QID) | INTRAMUSCULAR | Status: DC | PRN

## 2021-04-30 MED ORDER — HEPARIN SODIUM (PORCINE) 1000 UNIT/ML IJ SOLN
INTRAMUSCULAR | Status: AC
  Filled 2021-04-30: qty 1

## 2021-04-30 MED ORDER — HYDROCODONE-ACETAMINOPHEN 5-325 MG PO TABS: 1.0000 | ORAL_TABLET | ORAL | Status: DC | PRN

## 2021-04-30 MED ORDER — PHENYLEPHRINE HCL-NACL 20-0.9 MG/250ML-% IV SOLN
INTRAVENOUS | Status: DC | PRN
  Administered 2021-04-30: 25 ug/min via INTRAVENOUS

## 2021-04-30 MED ORDER — SODIUM CHLORIDE 0.9% FLUSH: 3.0000 mL | Freq: Two times a day (BID) | INTRAVENOUS | Status: DC

## 2021-04-30 MED ORDER — PROTAMINE SULFATE 10 MG/ML IV SOLN
INTRAVENOUS | Status: DC | PRN
  Administered 2021-04-30: 40 mg via INTRAVENOUS

## 2021-04-30 SURGICAL SUPPLY — 16 items
CATH OCTARAY 2.0 F 3-3-3-3-3 (CATHETERS) ×2 IMPLANT
CATH SMTCH THERMOCOOL SF DF (CATHETERS) ×2 IMPLANT
CATH SOUNDSTAR ECO 8FR (CATHETERS) ×2 IMPLANT
CATH WEB BI DIR CSDF CRV REPRO (CATHETERS) ×2 IMPLANT
CLOSURE PERCLOSE PROSTYLE (VASCULAR PRODUCTS) ×6 IMPLANT
COVER SWIFTLINK CONNECTOR (BAG) ×2 IMPLANT
NEEDLE BAYLIS TRANSSEPTAL 71CM (NEEDLE) ×2 IMPLANT
PACK EP LATEX FREE (CUSTOM PROCEDURE TRAY) ×1
PACK EP LF (CUSTOM PROCEDURE TRAY) ×1 IMPLANT
PAD PRO RADIOLUCENT 2001M-C (PAD) ×2 IMPLANT
PATCH CARTO3 (PAD) ×2 IMPLANT
SHEATH PINNACLE 7F 10CM (SHEATH) ×4 IMPLANT
SHEATH PINNACLE 9F 10CM (SHEATH) ×2 IMPLANT
SHEATH PROBE COVER 6X72 (BAG) ×2 IMPLANT
SHEATH SWARTZ TS SL2 63CM 8.5F (SHEATH) ×2 IMPLANT
TUBING SMART ABLATE COOLFLOW (TUBING) ×2 IMPLANT

## 2021-04-30 NOTE — H&P (Signed)
PCP:  Patrecia Pour, Christean Grief, MD          Cardiologist:  Dr Harrell Gave Primary Electrophysiologist: Thompson Grayer, MD        CC: afib   History of Present Illness: Charles Cameron is a 75 y.o. male who presents today for electrophysiology study and ablation of afib.     The patient was initially diagnosed with afib in the setting of pneumonia 07/2019.  He converted to sinus prior to discharge.  He returned to afib in June, 2021.  He has had increasing afib since that time.  Rate control has been difficult.  + palpitations and fatigue with afib.  + SOB with activity.  He also has a history of atrial flutter.  He has severe LA enlargement by recent echo  + cough for 1 week.  Denies fevers, chills, or worsening SOB from baseline.  Denies known COVID exposures.  Took home covid test several days ago which was negative.         Past Medical History:  Diagnosis Date   Anticoagulation adequate 07/14/2018   Cancer ([place])     Edema 07/14/2018   Hypertension     Prostate cancer ([place]) 2016   URI (upper respiratory infection) 07/14/2018         Past Surgical History:  Procedure Laterality Date   LUMBAR LAMINECTOMY   1989              Current Outpatient Medications  Medication Sig Dispense Refill   apixaban (ELIQUIS) 5 MG TABS tablet Take 1 tablet (5 mg total) by mouth 2 (two) times daily. 60 tablet 3   Cyanocobalamin (VITAMIN B-12) 5000 MCG SUBL Place 1 tablet under the tongue daily in the afternoon.       diltiazem (CARDIZEM CD) 360 MG 24 hr capsule TAKE 1 CAPSULE BY MOUTH EVERY DAY 90 capsule 3   diltiazem (CARDIZEM) 30 MG tablet Take 1 tablet (30 mg total) by mouth 4 (four) times daily as needed (tachycardia). 15 tablet 0   furosemide (LASIX) 20 MG tablet Take 1 tablet daily as needed for weight gain/swelling 20 tablet 1   GLUCOSAMINE-CHONDROITIN PO Take 1 tablet by mouth 3 (three) times daily. Taking 1500mg  by mouth daily       levocetirizine (XYZAL) 5 MG tablet Take 5 mg by mouth every  evening.       loratadine (CLARITIN) 10 MG tablet Take 10 mg by mouth every morning.       montelukast (SINGULAIR) 10 MG tablet Take 1 tablet (10 mg total) by mouth at bedtime. 90 tablet 3   Multiple Vitamin (MULTIVITAMIN) tablet Take 1 tablet by mouth daily.       Omega-3 Fatty Acids (FISH OIL PO) Take 1,400 mg by mouth daily.       potassium chloride (KLOR-CON) 20 MEQ tablet Take 1 tablet daily as needed with lasix use 20 tablet 1    No current facility-administered medications for this visit.      Allergies:   Patient has no known allergies.    Social History:  The patient  reports that he quit smoking about 37 years ago. His smoking use included cigarettes. He has a 54.00 pack-year smoking history. He has never used smokeless tobacco. He reports current alcohol use of about 4.0 standard drinks per week. He reports that he does not use drugs.    Family History:  The patient's family history includes Colon cancer in an other family member; Diabetes in his paternal grandfather;  Heart attack in his mother; Hypertension in his father and mother; Prostate cancer in his father.      ROS:  Please see the history of present illness.   All other systems are personally reviewed and negative.      PHYSICAL EXAM: Vitals:   04/30/21 0638  BP: (!) 157/96  Pulse: 95  Resp: 17  Temp: 97.6 F (36.4 C)  SpO2: 98%    GEN: Well nourished, well developed, in no acute distress HEENT: normal Neck: no JVD, carotid bruits, or masses Cardiac: iRRR; no murmurs, rubs, or gallops,no edema  Respiratory:  clear to auscultation bilaterally, normal work of breathing GI: soft, nontender, nondistended, + BS MS: no deformity or atrophy Skin: warm and dry  Neuro:  Strength and sensation are intact Psych: euthymic mood, full affect        ASSESSMENT AND PLAN:   1.  Persistent afib, atrial flutter The patient has symptomatic, recurrent atrial fibrillation and atrial flutter. Chads2vasc score is 3.  he is  anticoagulated with eliquis .  Risk, benefits, and alternatives to EP study and radiofrequency ablation for afib were again discussed in detail today. These risks include but are not limited to stroke, bleeding, vascular damage, tamponade, perforation, damage to the esophagus, lungs, and other structures, pulmonary vein stenosis, worsening renal function, and death. The patient understands these risk and wishes to proceed.    Cardiac CT reviewed at length with the patient today.  he reports compliance with [place] without interruption.  Thompson Grayer MD, [facility] 04/30/2021 8:34 AM

## 2021-04-30 NOTE — Anesthesia Postprocedure Evaluation (Signed)
Anesthesia Post Note  Patient: Charles Cameron  Procedure(s) Performed: ATRIAL FIBRILLATION ABLATION     Patient location during evaluation: Cath Lab Anesthesia Type: General Level of consciousness: awake and alert Pain management: pain level controlled Vital Signs Assessment: post-procedure vital signs reviewed and stable Respiratory status: spontaneous breathing, nonlabored ventilation and respiratory function stable Cardiovascular status: blood pressure returned to baseline and stable Postop Assessment: no apparent nausea or vomiting Anesthetic complications: no   No notable events documented.  Last Vitals:  Vitals:   04/30/21 1204 04/30/21 1232  BP: (!) 151/90 (!) 153/92  Pulse:  74  Resp: (!) 27 13  Temp:    SpO2:  96%    Last Pain:  Vitals:   04/30/21 1200  TempSrc:   PainSc: 0-No pain                 Taegan Haider,W. EDMOND

## 2021-04-30 NOTE — Transfer of Care (Signed)
Immediate Anesthesia Transfer of Care Note  Patient: Charles Cameron  Procedure(s) Performed: ATRIAL FIBRILLATION ABLATION  Patient Location: Cath Lab  Anesthesia Type:General  Level of Consciousness: awake, alert  and oriented  Airway & Oxygen Therapy: Patient Spontanous Breathing and Patient connected to nasal cannula oxygen  Post-op Assessment: Report given to RN and Post -op Vital signs reviewed and stable  Post vital signs: Reviewed and stable  Last Vitals:  Vitals Value Taken Time  BP 150/86 04/30/21 1107  Temp    Pulse 72 04/30/21 1111  Resp 0 04/30/21 1111  SpO2 96 % 04/30/21 1111  Vitals shown include unvalidated device data.  Last Pain:  Vitals:   04/30/21 0703  TempSrc:   PainSc: 0-No pain      Patients Stated Pain Goal: 3 (14/10/30 1314)  Complications: No notable events documented.

## 2021-04-30 NOTE — Anesthesia Procedure Notes (Signed)
Procedure Name: Intubation Date/Time: 04/30/2021 8:56 AM Performed by: Valda Favia, CRNA Pre-anesthesia Checklist: Patient identified, Emergency Drugs available, Suction available and Patient being monitored Patient Re-evaluated:Patient Re-evaluated prior to induction Oxygen Delivery Method: Circle System Utilized Preoxygenation: Pre-oxygenation with 100% oxygen Induction Type: IV induction Ventilation: Mask ventilation without difficulty Laryngoscope Size: Mac and 4 Grade View: Grade III Tube type: Oral Tube size: 7.5 mm Number of attempts: 1 Airway Equipment and Method: Stylet and Oral airway Placement Confirmation: ETT inserted through vocal cords under direct vision, positive ETCO2 and breath sounds checked- equal and bilateral Secured at: 23 cm Tube secured with: Tape Dental Injury: Teeth and Oropharynx as per pre-operative assessment

## 2021-05-01 ENCOUNTER — Encounter (HOSPITAL_COMMUNITY): Payer: Self-pay | Admitting: Internal Medicine

## 2021-05-01 LAB — POCT ACTIVATED CLOTTING TIME
Activated Clotting Time: 277 seconds
Activated Clotting Time: 306 seconds

## 2021-05-04 ENCOUNTER — Other Ambulatory Visit (HOSPITAL_COMMUNITY): Payer: Self-pay | Admitting: Nurse Practitioner

## 2021-05-05 ENCOUNTER — Telehealth: Payer: Self-pay | Admitting: Internal Medicine

## 2021-05-05 MED ORDER — LOSARTAN POTASSIUM 25 MG PO TABS: 25.0000 mg | ORAL_TABLET | Freq: Every day | ORAL | 3 refills | Status: DC

## 2021-05-05 NOTE — Telephone Encounter (Signed)
Pt c/o BP issue: STAT if pt c/o blurred vision, one-sided weakness or slurred speech  1. What are your last 5 BP readings?  10/24: 126/105 10/23: 164/100 10/22: 160/93 10/21: 145/79  2. Are you having any other symptoms (ex. Dizziness, headache, blurred vision, passed out)?  No  3. What is your BP issue?   Patient states his BP has been elevated ever since ablation on 10/19.

## 2021-05-05 NOTE — Telephone Encounter (Signed)
Pt called in to clinic regarding elevated BP as described below. Pt denies symptoms but steady increase in BP readings. Discussed with Adline Peals PA will start losartan 25mg  once a day with follow up as scheduled. Pt will continue to monitor and call if issues arise prior to follow up. Pt verbalized understanding of recommendations.

## 2021-05-28 ENCOUNTER — Ambulatory Visit (HOSPITAL_COMMUNITY)
Admission: RE | Admit: 2021-05-28 | Discharge: 2021-05-28 | Disposition: A | Discharge status: Home / Self Care | Payer: Medicare Other | Source: Ambulatory Visit | Attending: Physician Assistant | Admitting: Physician Assistant

## 2021-05-28 ENCOUNTER — Encounter (HOSPITAL_COMMUNITY): Payer: Self-pay | Admitting: Physician Assistant

## 2021-05-28 VITALS — BP 132/88 | HR 74 | Ht 70.0 in | Wt 242.4 lb

## 2021-05-28 DIAGNOSIS — I4819 Other persistent atrial fibrillation: Secondary | ICD-10-CM | POA: Insufficient documentation

## 2021-05-28 DIAGNOSIS — Z7901 Long term (current) use of anticoagulants: Secondary | ICD-10-CM | POA: Insufficient documentation

## 2021-05-28 DIAGNOSIS — Z79899 Other long term (current) drug therapy: Secondary | ICD-10-CM | POA: Diagnosis not present

## 2021-05-28 DIAGNOSIS — G4733 Obstructive sleep apnea (adult) (pediatric): Secondary | ICD-10-CM | POA: Diagnosis not present

## 2021-05-28 DIAGNOSIS — I10 Essential (primary) hypertension: Secondary | ICD-10-CM | POA: Diagnosis not present

## 2021-05-28 DIAGNOSIS — D6869 Other thrombophilia: Secondary | ICD-10-CM

## 2021-05-28 NOTE — Progress Notes (Signed)
Primary Care Physician: Patrecia Pour, Christean Grief, MD Referring Physician: Dr. Mervin Kung Charles Cameron is a 75 y.o. male with a h/o afb in the setting of pneumonia 07/2019. He converted to SR in the hospital. He continued on diltiazem but eliquis was stopped a few months later as he had a CHA2DS2VASc  score of 1. He returned to afib, rate controlled 6/30. His CHA2DS2VASc  score is now 3. He restarted an asa last week but will now stop this as he will need to go back on eliquis. Ekg shows afib today with a v rate of 92 bpm.   In the clinic, his weight is up 6 lbs from when he had his last weight in Epic and has seen more pedal edema for the last few days. No issues with PND/orthopnea. He does exercise 3 x a week and goes to yoga 2x a week. He feels he could do better with his diet. He does drink an alcoholic beverage a night. No tobacco. Denies any snoring issues.   Follow up in the AF clinic 02/04/21. Patient was seen at the ED twice 7/24 and 7/25 with rapid rates. Appears he was in rate controlled afib which converted to rapidly conducting atrial flutter at times. He was minimally symptomatic but does feel that he has more energy in SR. He has converted to SR this AM.  Follow up in the AF clinic 02/19/21. Patient reports that he went to the gym and noted his heart rate was elevated on the treadmill at 140-150 bpm. He walked a mile on the treadmill and was asymptomatic. He took a PRN diltiazem once he got home. His heart rate remained elevated and he presented to the AF clinic for follow up. ECG here shows rate controlled afib. He remains asymptomatic.   Follow up in the AF clinic 05/28/21. Patient is s/p afib ablation with Dr Rayann Heman on 04/30/21. He reports that he is doing well since the procedure. He has noted some higher heart rates at night but nothing during the day. He denies CP, swallowing pain, or groin issues.   Today, he denies symptoms of palpitations, chest pain, shortness of breath,  orthopnea, PND, lower extremity edema, dizziness, presyncope, syncope, or neurologic sequela. The patient is tolerating medications without difficulties and is otherwise without complaint today.   Past Medical History:  Diagnosis Date   Anticoagulation adequate 07/14/2018   Edema 07/14/2018   Hypertension    OSA (obstructive sleep apnea)    not tolerant of CPAP   Persistent atrial fibrillation ([place])    Prostate cancer ([place]) 2016   Skin Cancer    URI (upper respiratory infection) 07/14/2018   Past Surgical History:  Procedure Laterality Date   ATRIAL FIBRILLATION ABLATION N/A 04/30/2021   Procedure: ATRIAL FIBRILLATION ABLATION;  Surgeon: Thompson Grayer, MD;  Location: [facility];  Service: Cardiovascular;  Laterality: N/A;   LUMBAR LAMINECTOMY  07/14/1987   PROSTATECTOMY      Current Outpatient Medications  Medication Sig Dispense Refill   Ascorbic Acid (VITAMIN C) 1000 MG tablet Take 1,000 mg by mouth daily.     Cyanocobalamin (VITAMIN B-12) 5000 MCG SUBL Place 1 tablet under the tongue daily in the afternoon.     diltiazem (CARDIZEM) 30 MG tablet Take 1 tablet (30 mg total) by mouth 4 (four) times daily as needed (tachycardia). 15 tablet 0   ELIQUIS 5 MG TABS tablet TAKE 1 TABLET BY MOUTH TWICE A DAY 60 tablet 3  furosemide (LASIX) 20 MG tablet Take 1 tablet daily as needed for weight gain/swelling 20 tablet 1   ibuprofen (ADVIL) 200 MG tablet Take 600 mg by mouth every 6 (six) hours as needed for moderate pain.     levocetirizine (XYZAL) 5 MG tablet Take 5 mg by mouth every evening.     Misc Natural Products (GLUCOSAMINE CHONDROITIN TRIPLE) TABS Take 2 tablets by mouth daily.     montelukast (SINGULAIR) 10 MG tablet Take 1 tablet (10 mg total) by mouth at bedtime. 90 tablet 3   Multiple Vitamin (MULTIVITAMIN) tablet Take 1 tablet by mouth daily.     Omega-3 Fatty Acids (FISH OIL) 1000 MG CAPS Take 1,000 mg by mouth daily.     pantoprazole (PROTONIX) 40 MG tablet Take 1  tablet (40 mg total) by mouth daily. 45 tablet 0   potassium chloride (KLOR-CON) 20 MEQ tablet Take 1 tablet daily as needed with lasix use 20 tablet 1   pravastatin (PRAVACHOL) 40 MG tablet Take 40 mg by mouth daily.     TIADYLT ER 360 MG 24 hr capsule TAKE 1 CAPSULE BY MOUTH EVERY DAY 90 capsule 2   No current facility-administered medications for this encounter.    No Known Allergies  Social History   Socioeconomic History   Marital status: Married    Spouse name: [name]   Number of children: 5   Years of education: 16   Highest education level: Not on file  Occupational History   Occupation: retired  Tobacco Use   Smoking status: Former    Packs/day: 2.00    Years: 27.00    Pack years: 54.00    Types: Cigarettes    Quit date: 07/14/1983    Years since quitting: 37.8   Smokeless tobacco: Never   Tobacco comments:    Former smoker 05/28/2021  Substance and Sexual Activity   Alcohol use: Yes    Alcohol/week: 4.0 standard drinks    Types: 1 Glasses of wine, 1 Cans of beer, 1 Shots of liquor, 1 Standard drinks or equivalent per week    Comment: light use; rarely   Drug use: No   Sexual activity: Not on file  Other Topics Concern   Not on file  Social History Narrative   Patient lives at [place] with spouse.Caffeine Use: 2 cups daily   Retired Art gallery manager   Social Determinants of Health   Financial Resource Strain: Not on file  Food Insecurity: Not on file  Transportation Needs: Not on file  Physical Activity: Not on file  Stress: Not on file  Social Connections: Not on file  Intimate Partner Violence: Not on file    Family History  Problem Relation Age of Onset   Heart attack Mother    Hypertension Mother    Prostate cancer Father    Hypertension Father    Colon cancer Other    Diabetes Paternal Grandfather     ROS- All systems are reviewed and negative except as per the HPI above  Physical Exam: Vitals:   05/28/21 0833  BP: 132/88  Pulse:  74  Weight: 110 kg  Height: 5\' 10"  (1.778 m)    Wt Readings from Last 3 Encounters:  05/28/21 110 kg  04/30/21 106.6 kg  03/28/21 107.9 kg    Labs: Lab Results  Component Value Date   NA 141 04/08/2021   K 4.0 04/08/2021   CL 103 04/08/2021   CO2 25 04/08/2021   GLUCOSE 161 (H) 04/08/2021  BUN 15 04/08/2021   CREATININE 1.17 04/08/2021   CALCIUM 9.6 04/08/2021   MG 2.2 02/02/2021   Lab Results  Component Value Date   INR 1.43 07/11/2018   No results found for: CHOL, HDL, LDLCALC, TRIG  GEN- The patient is a well appearing obese elderly male, alert and oriented x 3 today.   HEENT-head normocephalic, atraumatic, sclera clear, conjunctiva pink, hearing intact, trachea midline. Lungs- Clear to ausculation bilaterally, normal work of breathing Heart- Regular rate and rhythm, no murmurs, rubs or gallops  GI- soft, NT, ND, + BS Extremities- no clubbing, cyanosis, or edema MS- no significant deformity or atrophy Skin- no rash or lesion Psych- euthymic mood, full affect Neuro- strength and sensation are intact   EKG-  SR, RBBB Vent. rate 74 BPM PR interval 172 ms QRS duration 148 ms QT/QTcB 436/483 ms  Echo 03/21/21 demonstrated 1. Left ventricular ejection fraction, by estimation, is 70 to 75%. The  left ventricle has hyperdynamic function. The left ventricle has no regional wall motion abnormalities. There is mild left ventricular hypertrophy. Left ventricular diastolic  function could not be evaluated.   2. Right ventricular systolic function is mildly reduced. The right ventricular size is mildly enlarged. The estimated right ventricular systolic pressure is 35.3 mmHg.   3. Left atrial size was severely dilated.   4. Right atrial size was mildly dilated.   5. The mitral valve is grossly normal. Mild mitral valve regurgitation.   6. The aortic valve is tricuspid. Aortic valve regurgitation is mild. Mild to moderate aortic valve sclerosis/calcification is present,  without any evidence of aortic stenosis.   7. Aortic dilatation noted. There is borderline dilatation of the aortic root, measuring 39 mm. There is mild dilatation of the ascending aorta, measuring 41 mm.   8. The inferior vena cava is normal in size with greater than 50% respiratory variability, suggesting right atrial pressure of 3 mmHg.   Comparison(s): Changes from prior study are noted. 07/11/2018: LVEF  55-60%, mild RAE, mildly dilated RV, moderate MR.   Epic records reviewed   Assessment and Plan:  1. Persistent Afib/atrial flutter S/p afib ablation 04/30/21 Patient appears to be maintaining SR. Continue Eliquis 5 mg BID with no missed doses for 3 months post ablation.  Continue diltiazem 30 mg PRN q 4 hours for heart racing. Continue diltiazem 360 mg daily  2. CHA2DS2VASc  score of 3  Continue eliquis 5 mg BID  3. HTN Stable, no changes today. He never started losartan. Can consider starting if BP consistently elevated.   4. OSA Not on CPAP   Follow up with Dr Rayann Heman and Dr Harrell Gave as scheduled.    [facility] [address] [place], [place] [address] [phone]

## 2021-06-06 ENCOUNTER — Other Ambulatory Visit: Payer: Self-pay | Admitting: Internal Medicine

## 2021-06-21 ENCOUNTER — Other Ambulatory Visit: Payer: Self-pay | Admitting: Internal Medicine

## 2021-06-29 ENCOUNTER — Encounter (HOSPITAL_BASED_OUTPATIENT_CLINIC_OR_DEPARTMENT_OTHER): Payer: Self-pay

## 2021-06-29 DIAGNOSIS — D6869 Other thrombophilia: Secondary | ICD-10-CM

## 2021-06-29 DIAGNOSIS — I10 Essential (primary) hypertension: Secondary | ICD-10-CM

## 2021-06-30 MED ORDER — LOSARTAN POTASSIUM 50 MG PO TABS: 50.0000 mg | ORAL_TABLET | Freq: Every day | ORAL | 1 refills | Status: DC

## 2021-06-30 NOTE — Telephone Encounter (Signed)
Hey here are the blood pressure readings you requested!

## 2021-07-15 LAB — BASIC METABOLIC PANEL
BUN/Creatinine Ratio: 11 (ref 10–24)
BUN: 12 mg/dL (ref 8–27)
CO2: 24 mmol/L (ref 20–29)
Calcium: 9.4 mg/dL (ref 8.6–10.2)
Chloride: 102 mmol/L (ref 96–106)
Creatinine, Ser: 1.14 mg/dL (ref 0.76–1.27)
Glucose: 134 mg/dL — ABNORMAL HIGH (ref 70–99)
Potassium: 4 mmol/L (ref 3.5–5.2)
Sodium: 141 mmol/L (ref 134–144)
eGFR: 66 mL/min/{1.73_m2} (ref >59)

## 2021-07-18 ENCOUNTER — Encounter (INDEPENDENT_AMBULATORY_CARE_PROVIDER_SITE_OTHER): Payer: Self-pay

## 2021-07-18 NOTE — Telephone Encounter (Signed)
Common causes of tachycardia include dehydration, increased caffeine, or increased stress. Recommend staying well hydrated, avoiding caffeine, and managing stress well. Ensure taking his Diltiazem 360mg  as close to every 24 hours as possible to ensure consistent levels. He may use his Diltiazem 30mg  as needed for persistent tachycardia or palpitations. Recommend follow up as scheduled with Dr. Rayann Heman in February.   Loel Dubonnet, NP

## 2021-07-30 ENCOUNTER — Ambulatory Visit: Payer: Medicare Other | Admitting: Internal Medicine

## 2021-07-31 ENCOUNTER — Other Ambulatory Visit: Payer: Self-pay | Admitting: Family

## 2021-08-27 ENCOUNTER — Other Ambulatory Visit (HOSPITAL_COMMUNITY): Payer: Self-pay | Admitting: Nurse Practitioner

## 2021-08-27 DIAGNOSIS — I480 Paroxysmal atrial fibrillation: Secondary | ICD-10-CM

## 2021-08-27 NOTE — Telephone Encounter (Signed)
Prescription refill request for Eliquis received. Indication: Afib  Last office visit:05/28/21 ([place])  Scr: 1.14 (07/25/21)  Age: 76 Weight: 110kg  Appropriate dose and refill sent to requested pharmacy.

## 2021-08-29 ENCOUNTER — Ambulatory Visit (INDEPENDENT_AMBULATORY_CARE_PROVIDER_SITE_OTHER): Payer: Medicare Other | Admitting: Internal Medicine

## 2021-08-29 ENCOUNTER — Encounter: Payer: Self-pay | Admitting: Internal Medicine

## 2021-08-29 ENCOUNTER — Other Ambulatory Visit: Payer: Self-pay

## 2021-08-29 VITALS — BP 128/80 | HR 75 | Ht 70.0 in | Wt 235.4 lb

## 2021-08-29 DIAGNOSIS — I10 Essential (primary) hypertension: Secondary | ICD-10-CM

## 2021-08-29 DIAGNOSIS — I4819 Other persistent atrial fibrillation: Secondary | ICD-10-CM

## 2021-08-29 DIAGNOSIS — G4733 Obstructive sleep apnea (adult) (pediatric): Secondary | ICD-10-CM | POA: Diagnosis not present

## 2021-08-29 DIAGNOSIS — D6869 Other thrombophilia: Secondary | ICD-10-CM | POA: Diagnosis not present

## 2021-08-29 MED ORDER — DILTIAZEM HCL ER COATED BEADS 180 MG PO CP24: 180.0000 mg | ORAL_CAPSULE | Freq: Every day | ORAL | 0 refills | Status: DC

## 2021-08-29 NOTE — Progress Notes (Signed)
PCP: Patrecia Pour, Christean Grief, MD Primary Cardiologist: Dr Fredia Beets is a 76 y.o. male who presents today for routine electrophysiology followup.  Since his recent afib ablation, the patient reports doing very well.  he denies procedure related complications and is pleased with the results of the procedure.  Today, he denies symptoms of palpitations, chest pain, shortness of breath,  lower extremity edema, dizziness, presyncope, or syncope.  The patient is otherwise without complaint today.   Past Medical History:  Diagnosis Date   Anticoagulation adequate 07/14/2018   Edema 07/14/2018   Hypertension    OSA (obstructive sleep apnea)    not tolerant of CPAP   Persistent atrial fibrillation ([place])    Prostate cancer ([place]) 2016   Skin Cancer    URI (upper respiratory infection) 07/14/2018   Past Surgical History:  Procedure Laterality Date   ATRIAL FIBRILLATION ABLATION N/A 04/30/2021   Procedure: ATRIAL FIBRILLATION ABLATION;  Surgeon: Thompson Grayer, MD;  Location: [facility];  Service: Cardiovascular;  Laterality: N/A;   LUMBAR LAMINECTOMY  07/14/1987   PROSTATECTOMY      ROS- all systems are personally reviewed and negatives except as per HPI above  Current Outpatient Medications  Medication Sig Dispense Refill   apixaban (ELIQUIS) 5 MG TABS tablet TAKE 1 TABLET BY MOUTH TWICE A DAY 60 tablet 5   Ascorbic Acid (VITAMIN C) 1000 MG tablet Take 1,000 mg by mouth daily.     Cyanocobalamin (VITAMIN B-12) 5000 MCG SUBL Place 1 tablet under the tongue daily in the afternoon.     diltiazem (CARDIZEM) 30 MG tablet Take 1 tablet (30 mg total) by mouth 4 (four) times daily as needed (tachycardia). 15 tablet 0   ezetimibe (ZETIA) 10 MG tablet Take 10 mg by mouth daily.     furosemide (LASIX) 20 MG tablet Take 1 tablet daily as needed for weight gain/swelling 20 tablet 1   ibuprofen (ADVIL) 200 MG tablet Take 600 mg by mouth every 6 (six) hours as needed for moderate  pain.     levocetirizine (XYZAL) 5 MG tablet Take 5 mg by mouth every evening.     losartan (COZAAR) 50 MG tablet Take 1 tablet (50 mg total) by mouth daily. 90 tablet 1   Misc Natural Products (GLUCOSAMINE CHONDROITIN TRIPLE) TABS Take 2 tablets by mouth daily.     montelukast (SINGULAIR) 10 MG tablet Take 1 tablet (10 mg total) by mouth at bedtime. 90 tablet 3   Multiple Vitamin (MULTIVITAMIN) tablet Take 1 tablet by mouth daily.     Omega-3 Fatty Acids (FISH OIL) 1000 MG CAPS Take 1,000 mg by mouth daily.     potassium chloride (KLOR-CON) 20 MEQ tablet Take 1 tablet daily as needed with lasix use 20 tablet 1   TIADYLT ER 360 MG 24 hr capsule TAKE 1 CAPSULE BY MOUTH EVERY DAY 90 capsule 2   No current facility-administered medications for this visit.    Physical Exam: Vitals:   08/29/21 0838  Weight: 235 lb 6.4 oz (106.8 kg)  Height: 5\' 10"  (1.778 m)    GEN- The patient is well appearing, alert and oriented x 3 today.   Head- normocephalic, atraumatic Eyes-  Sclera clear, conjunctiva pink Ears- hearing intact Oropharynx- clear Lungs- Clear to ausculation bilaterally, normal work of breathing Heart- Regular rate and rhythm, no murmurs, rubs or gallops, PMI not laterally displaced GI- soft, NT, ND, + BS Extremities- no clubbing, cyanosis, or edema  EKG tracing ordered  today is personally reviewed and shows sinus rhythm, RBBB  Assessment and Plan:  1. Persistent atrial fibrillation and atrial flutter Doing well s/p ablation chads2vasc score is 3.  Continue eliquis Reduce diltiazem to 180mg  daily x 6 weeks then discontinue He is interested in REACT AF trial  2. HTN Stable No change required today  3. OSA Not compliant with CPAP  4. MR Mild by echo 03/21/21 (reviewed)  Return to see me in 6 months  Thompson Grayer MD, [facility] 08/29/2021 8:42 AM

## 2021-08-29 NOTE — Patient Instructions (Addendum)
Medication Instructions:  Your physician has recommended you make the following change in your medication:    STOP diltiazem 360 mg  2.   START taking diltiazem CD 180 mg-  Take one capsule by mouth daily.  You will STOP this medication after 6 weeks.  Labwork: None ordered.  Testing/Procedures: None ordered.  Follow-Up: Your physician wants you to follow-up in: 6 months with Dr. Vallery Ridge will receive a reminder letter in the mail two months in advance. If you don't receive a letter, please call our office to schedule the follow-up appointment.   Any Other Special Instructions Will Be Listed Below (If Applicable).  If you need a refill on your cardiac medications before your next appointment, please call your pharmacy.

## 2021-09-01 MED ORDER — RIVAROXABAN 20 MG PO TABS: 20.0000 mg | ORAL_TABLET | Freq: Every day | ORAL | 6 refills | Status: DC

## 2021-09-25 NOTE — Progress Notes (Signed)
?Cardiology Office Note:   ? ?Date:  09/26/2021  ? ?ID:  Charles Cameron, DOB September 24, 1945, MRN [id number] ? ?PCP:  Patrecia Pour, Christean Grief, MD  ?Cardiologist:  Buford Dresser, MD PhD ? ?Referring MD: Patrecia Pour, Christean Grief, MD  ? ?CC: follow up ? ?History of Present Illness:   ? ?Charles Cameron is a 76 y.o. male with a hx of pneumonia 06/2018-07/2018 with atrial flutter with paroxysmal atrial fibrillation diagnosed at the same time. He is seen for follow up today. ? ?Cardiac history: No prior history until 06/2018, when he developed URI and atrial flutter/atrial fibrillation. He had intermittent RVR while in the hospital, requiring multiple medications for management. He was initially on dronedarone (stopped after 30 days), diltiazem, and metoprolol. Had volume overload with arrhythmia, improved with lasix. EF was normal. Afib has recurred, planned for ablation 04/2021. ? ?He underwent successful atrial fibrillation ablation on 04/30/2021. ? ?Today: ?Overall, he is feeling great. Since his last visit he successfully lost 15 lbs. For the past 3 months he has been working with a Physiological scientist. Lot of cardio workouts and weight lifting (3x a week for each). They are gradually increasing the weight as well. He denies any significant exertional symptoms. ? ?However, he has noticed that his diastolic blood pressure seems to be high. He checks it a few hours after his medication, after he has exercised. Typically he sees diastolic pressures in the high 80s to low 90s. ? ?He last saw Dr. Rayann Heman 08/29/2021. He is weaning off of diltiazem. No complications or symptoms at this time. ? ?He denies any palpitations, chest pain, shortness of breath, or peripheral edema. No lightheadedness, headaches, syncope, orthopnea, or PND. ? ? ? ?Past Medical History:  ?Diagnosis Date  ? Anticoagulation adequate 07/14/2018  ? Edema 07/14/2018  ? Hypertension   ? OSA (obstructive sleep apnea)   ? not tolerant of CPAP  ? Persistent atrial  fibrillation ([place])   ? Prostate cancer ([place]) 2016  ? Skin Cancer   ? URI (upper respiratory infection) 07/14/2018  ? ? ?Past Surgical History:  ?Procedure Laterality Date  ? ATRIAL FIBRILLATION ABLATION N/A 04/30/2021  ? Procedure: ATRIAL FIBRILLATION ABLATION;  Surgeon: Thompson Grayer, MD;  Location: [facility];  Service: Cardiovascular;  Laterality: N/A;  ? LUMBAR LAMINECTOMY  07/14/1987  ? PROSTATECTOMY    ? ? ?Current Medications: ?Current Outpatient Medications on File Prior to Visit  ?Medication Sig  ? Ascorbic Acid (VITAMIN C) 1000 MG tablet Take 1,000 mg by mouth daily.  ? Cyanocobalamin (VITAMIN B-12) 5000 MCG SUBL Place 1 tablet under the tongue daily in the afternoon.  ? diltiazem (CARDIZEM CD) 180 MG 24 hr capsule Take 1 capsule (180 mg total) by mouth daily. Discontinue after 6 weeks  ? diltiazem (CARDIZEM) 30 MG tablet Take 1 tablet (30 mg total) by mouth 4 (four) times daily as needed (tachycardia).  ? ezetimibe (ZETIA) 10 MG tablet Take 10 mg by mouth daily.  ? ibuprofen (ADVIL) 200 MG tablet Take 600 mg by mouth every 6 (six) hours as needed for moderate pain.  ? levocetirizine (XYZAL) 5 MG tablet Take 5 mg by mouth every evening.  ? losartan (COZAAR) 50 MG tablet Take 1 tablet (50 mg total) by mouth daily.  ? Misc Natural Products (GLUCOSAMINE CHONDROITIN TRIPLE) TABS Take 2 tablets by mouth daily.  ? montelukast (SINGULAIR) 10 MG tablet Take 1 tablet (10 mg total) by mouth at bedtime.  ? Multiple Vitamin (MULTIVITAMIN) tablet Take 1 tablet  by mouth daily.  ? Omega-3 Fatty Acids (FISH OIL) 1000 MG CAPS Take 1,000 mg by mouth daily.  ? rivaroxaban (XARELTO) 20 MG TABS tablet Take 1 tablet (20 mg total) by mouth daily with supper.  ? ?No current facility-administered medications on file prior to visit.  ?  ? ?Allergies:   Patient has no known allergies.  ? ?Social History  ? ?Tobacco Use  ? Smoking status: Former  ?  Packs/day: 2.00  ?  Years: 27.00  ?  Pack years: 54.00  ?  Types: Cigarettes   ?  Quit date: 07/14/1983  ?  Years since quitting: 38.2  ?  Passive exposure: Past  ? Smokeless tobacco: Never  ? Tobacco comments:  ?  Former smoker 05/28/2021  ?Substance Use Topics  ? Alcohol use: Yes  ?  Alcohol/week: 4.0 standard drinks  ?  Types: 1 Glasses of wine, 1 Cans of beer, 1 Shots of liquor, 1 Standard drinks or equivalent per week  ?  Comment: light use; rarely  ? Drug use: No  ? ? ?Family History: ?The patient's family history includes Colon cancer in an other family member; Diabetes in his paternal grandfather; Heart attack in his mother; Hypertension in his father and mother; Prostate cancer in his father. ? ?ROS:   ?Please see the history of present illness.   ?Additional pertinent ROS otherwise unremarkable ? ?EKGs/Labs/Other Studies Reviewed:   ? ?The following studies were reviewed today: ? ?Atrial Fibrillation Ablation 04/30/2021: ?CONCLUSIONS: ?1. Atrial fibrillation upon presentation.   ?2. Intracardiac echo reveals a moderate to large sized left atrium. ?3. Successful electrical isolation and anatomical encircling of all four pulmonary veins with radiofrequency current.  The patient converted to sinus rhythm with isolation of the left PVs.  The PVs remained in afib while the patient remained in sinus for the duration of the procedure today. ?4. Cavo-tricuspid isthmus ablation performed with complete bidirectional isthmus block achieved ?5. No inducible arrhythmias following ablation ?6. No early apparent complications. ?  ?Additional instructions: ?The patient takes eliquis '5mg'$  BID for stroke prevention.  We will resume eliquis post procedure today.  The patient should not interrupt oral anticoagulation for any reason for 3 months post ablation. ? ?Cardiac CTA 04/23/2021: ?FINDINGS: ?Image quality: Average. ?  ?Noise artifact is: Limited, moderate, severe. (signal-to-noise ?(obese)/beam/movement/mis-registration). ?  ?Pulmonary Veins: There is normal pulmonary vein drainage into the ?left  atrium (2 on the right and 2 on the left) with ostial ?measurements as follows: ?  ?RUPV: Ostium 18.47m x 14.271m area 1.97cm2 ?  ?RLPV:  Ostium 26.19m819m 17.7mm50mrea 3.56cm2 ?  ?LUPV:  Ostium 22.6mm 53m7.19mm a56m 2.87cm2 ?  ?LLPV:  Ostium 19.3mm x 46m6mm  ar38m2.45cm2 ?  ?Left Atrium: The left atrial size is normal. There is no PFO/ASD. ?The left atrial appendage is large broccoli type. There is no ?thrombus in the left atrial appendage on contrast or delayed ?imaging. The esophagus runs close in proximity to the left pulmonary ?vein ostia. ?  ?Coronary Arteries: CAC score of 297, which is 52nd percentile for ?age-, race-, and sex-matched controls. Normal coronary origin. Right ?dominance. The study was performed without use of NTG and is ?insufficient for plaque evaluation. ?  ?Right Atrium: Right atrial size is moderate dilated. ?  ?Right Ventricle: The right ventricular cavity is mildly dilated. ?  ?Left Ventricle: The ventricular cavity size is within normal limits. ?There are no stigmata of prior infarction. There is no abnormal ?filling defect. ?  ?  Pericardium: Normal thickness with no significant effusion or ?calcium present. ?  ?Pulmonary Artery: Normal caliber without proximal filling defect. ?  ?Cardiac valves: The aortic valve is trileaflet with minimal ?calcifications. The mitral valve is normal structure without ?significant calcification. ?  ?Aorta: Normal caliber with no significant disease. ?  ?Extra-cardiac findings: See attached radiology report for ?non-cardiac structures. ?  ?IMPRESSION: ?1. There is normal pulmonary vein drainage into the left atrium with ?ostial measurements above. ?  ?2. There is no thrombus in the left atrial appendage. ?  ?3. The esophagus runs in the left atrial midline and is not in ?proximity to any of the pulmonary vein ostia. ?  ?4. No PFO/ASD. ?  ?5. Normal coronary origin. Right dominance. ?  ?6. CAC score of 0 which is 0 percentile for age-, race-, and ?sex-matched  controls. ? ?Echo 03/21/2021: ?1. Left ventricular ejection fraction, by estimation, is 70 to 75%. The  ?left ventricle has hyperdynamic function. The left ventricle has no  ?regional wall motion abnormalitie

## 2021-09-26 ENCOUNTER — Ambulatory Visit (INDEPENDENT_AMBULATORY_CARE_PROVIDER_SITE_OTHER): Payer: Medicare Other | Admitting: Cardiology

## 2021-09-26 ENCOUNTER — Encounter (HOSPITAL_BASED_OUTPATIENT_CLINIC_OR_DEPARTMENT_OTHER): Payer: Self-pay | Admitting: Cardiology

## 2021-09-26 ENCOUNTER — Other Ambulatory Visit: Payer: Self-pay

## 2021-09-26 VITALS — BP 130/72 | HR 77 | Ht 70.0 in | Wt 234.1 lb

## 2021-09-26 DIAGNOSIS — D6869 Other thrombophilia: Secondary | ICD-10-CM | POA: Diagnosis not present

## 2021-09-26 DIAGNOSIS — I480 Paroxysmal atrial fibrillation: Secondary | ICD-10-CM

## 2021-09-26 DIAGNOSIS — Z8679 Personal history of other diseases of the circulatory system: Secondary | ICD-10-CM

## 2021-09-26 DIAGNOSIS — Z7901 Long term (current) use of anticoagulants: Secondary | ICD-10-CM

## 2021-09-26 DIAGNOSIS — I10 Essential (primary) hypertension: Secondary | ICD-10-CM

## 2021-09-26 NOTE — Patient Instructions (Signed)
Medication Instructions:  Continue current medications  *If you need a refill on your cardiac medications before your next appointment, please call your pharmacy*   Lab Work: None Ordered   Testing/Procedures: None Ordered   Follow-Up: At [facility], you and your health needs are our priority.  As part of our continuing mission to provide you with exceptional heart care, we have created designated Provider Care Teams.  These Care Teams include your primary Cardiologist (physician) and Advanced Practice Providers (APPs -  Physician Assistants and Nurse Practitioners) who all work together to provide you with the care you need, when you need it.  We recommend signing up for the patient portal called "MyChart".  Sign up information is provided on this After Visit Summary.  MyChart is used to connect with patients for Virtual Visits (Telemedicine).  Patients are able to view lab/test results, encounter notes, upcoming appointments, etc.  Non-urgent messages can be sent to your provider as well.   To learn more about what you can do with MyChart, go to [email or web address].    Your next appointment:   6 month(s)  The format for your next appointment:   In Person  Provider:   [name], MD              

## 2021-10-02 ENCOUNTER — Other Ambulatory Visit: Payer: Self-pay | Admitting: Internal Medicine

## 2021-10-17 ENCOUNTER — Other Ambulatory Visit: Payer: Self-pay

## 2021-10-17 ENCOUNTER — Emergency Department (HOSPITAL_BASED_OUTPATIENT_CLINIC_OR_DEPARTMENT_OTHER)
Admission: EM | Admit: 2021-10-17 | Discharge: 2021-10-17 | Disposition: A | Discharge status: Home / Self Care | Payer: Medicare Other | Attending: Emergency Medicine | Admitting: Emergency Medicine

## 2021-10-17 ENCOUNTER — Emergency Department (HOSPITAL_BASED_OUTPATIENT_CLINIC_OR_DEPARTMENT_OTHER): Payer: Medicare Other

## 2021-10-17 ENCOUNTER — Encounter (HOSPITAL_BASED_OUTPATIENT_CLINIC_OR_DEPARTMENT_OTHER): Payer: Self-pay | Admitting: Urology

## 2021-10-17 DIAGNOSIS — Z8546 Personal history of malignant neoplasm of prostate: Secondary | ICD-10-CM | POA: Diagnosis not present

## 2021-10-17 DIAGNOSIS — R413 Other amnesia: Secondary | ICD-10-CM | POA: Insufficient documentation

## 2021-10-17 DIAGNOSIS — Z7901 Long term (current) use of anticoagulants: Secondary | ICD-10-CM | POA: Insufficient documentation

## 2021-10-17 DIAGNOSIS — I10 Essential (primary) hypertension: Secondary | ICD-10-CM | POA: Diagnosis not present

## 2021-10-17 LAB — URINALYSIS, MICROSCOPIC (REFLEX)

## 2021-10-17 LAB — URINALYSIS, ROUTINE W REFLEX MICROSCOPIC
Bilirubin Urine: NEGATIVE
Glucose, UA: NEGATIVE mg/dL
Ketones, ur: NEGATIVE mg/dL
Leukocytes,Ua: NEGATIVE
Nitrite: NEGATIVE
Protein, ur: NEGATIVE mg/dL
Specific Gravity, Urine: 1.025 (ref 1.005–1.030)
pH: 6 (ref 5.0–8.0)

## 2021-10-17 LAB — CBC WITH DIFFERENTIAL/PLATELET
Abs Immature Granulocytes: 0.02 10*3/uL (ref 0.00–0.07)
Basophils Absolute: 0 10*3/uL (ref 0.0–0.1)
Basophils Relative: 1 %
Eosinophils Absolute: 0.3 10*3/uL (ref 0.0–0.5)
Eosinophils Relative: 4 %
HCT: 43.5 % (ref 39.0–52.0)
Hemoglobin: 14.8 g/dL (ref 13.0–17.0)
Immature Granulocytes: 0 %
Lymphocytes Relative: 36 %
Lymphs Abs: 2.3 10*3/uL (ref 0.7–4.0)
MCH: 32.6 pg (ref 26.0–34.0)
MCHC: 34 g/dL (ref 30.0–36.0)
MCV: 95.8 fL (ref 80.0–100.0)
Monocytes Absolute: 0.7 10*3/uL (ref 0.1–1.0)
Monocytes Relative: 11 %
Neutro Abs: 3 10*3/uL (ref 1.7–7.7)
Neutrophils Relative %: 48 %
Platelets: 172 10*3/uL (ref 150–400)
RBC: 4.54 MIL/uL (ref 4.22–5.81)
RDW: 13 % (ref 11.5–15.5)
WBC: 6.3 10*3/uL (ref 4.0–10.5)
nRBC: 0 % (ref 0.0–0.2)

## 2021-10-17 LAB — COMPREHENSIVE METABOLIC PANEL
ALT: 33 U/L (ref 0–44)
AST: 35 U/L (ref 15–41)
Albumin: 3.6 g/dL (ref 3.5–5.0)
Alkaline Phosphatase: 119 U/L (ref 38–126)
Anion gap: 7 (ref 5–15)
BUN: 20 mg/dL (ref 8–23)
CO2: 26 mmol/L (ref 22–32)
Calcium: 8.9 mg/dL (ref 8.9–10.3)
Chloride: 108 mmol/L (ref 98–111)
Creatinine, Ser: 1.17 mg/dL (ref 0.61–1.24)
GFR, Estimated: >60 mL/min (ref >60)
Glucose, Bld: 100 mg/dL — ABNORMAL HIGH (ref 70–99)
Potassium: 4.2 mmol/L (ref 3.5–5.1)
Sodium: 141 mmol/L (ref 135–145)
Total Bilirubin: 0.5 mg/dL (ref 0.3–1.2)
Total Protein: 6.5 g/dL (ref 6.5–8.1)

## 2021-10-17 NOTE — ED Notes (Signed)
Patient transported to CT 

## 2021-10-17 NOTE — Discharge Instructions (Signed)
Your labs and CT scan were very reassuring today.  There were no abnormalities that could account for you Memory loss aside from mild age-related changes.  If this continues to happen, please follow-up with your PCP.  If you develop symptoms of stroke to include facial droop, slurred speech or weakness on one side of your body, please return to the ED for evaluation. ?

## 2021-10-17 NOTE — ED Triage Notes (Signed)
Pt states I feel like I have had memory problems that started today, does not remember buying a plant this am, memory loss from yesterday  ?Denies HA, Denies weakness, states " I feel off"  ?No h/o stroke  ?A & O x 4  ?

## 2021-10-17 NOTE — ED Notes (Signed)
Patient returned to room from CT. 

## 2021-10-17 NOTE — ED Provider Notes (Signed)
?[facility] ?Provider Note ? ? ?CSN: [id number] ?Arrival date & time: 10/17/21  1922 ? ?  ? ?History ? ?Chief Complaint  ?Patient presents with  ? Memory Loss  ? ? ?Charles Cameron is a 76 y.o. male with history significant for hypertension, prostate cancer, persistent atrial fibrillation on Xarelto who presents to the ED for evaluation of acute memory loss.  Per patient's wife, he picked her up from the airport this afternoon and, when she got home, she asked about 2 new plants sitting by the sink. Per wife and patient, he apparently purchased them this morning, but could not remember that happening. She also states that he was forgetting entire events and conversations that they had yesterday evening, and all of this is entirely unlike him. Additionally, patient states that he felt "off" earlier today while he was at the gym. He lifted weights and then got onto the treadmill where he began to feel nauseous and not right. Patient and wife deny facial droop, slurred speech, unilateral weakness. No prior history of stroke.  ? ?Pt denies chest pain, sob, abd pain, v/d, leg swelling, headache, dizziness, ataxia, vision changes, numbness/tingling. ?HPI ? ?  ? ?Home Medications ?Prior to Admission medications   ?Medication Sig Start Date End Date Taking? Authorizing Provider  ?Ascorbic Acid (VITAMIN C) 1000 MG tablet Take 1,000 mg by mouth daily.    [provider]  ?Cyanocobalamin (VITAMIN B-12) 5000 MCG SUBL Place 1 tablet under the tongue daily in the afternoon.    [provider]  ?diltiazem (CARDIZEM CD) 180 MG 24 hr capsule Take 1 capsule (180 mg total) by mouth daily. Discontinue after 6 weeks 08/29/21 10/10/21  Allred, Jeneen Rinks, MD  ?diltiazem (CARDIZEM) 30 MG tablet Take 1 tablet (30 mg total) by mouth 4 (four) times daily as needed (tachycardia). 02/03/21   Caccavale, Sophia, PA-C  ?ezetimibe (ZETIA) 10 MG tablet Take 10 mg by mouth daily. 08/05/21   [provider]  ?ibuprofen (ADVIL) 200 MG tablet Take 600 mg by mouth every 6 (six) hours as needed for moderate pain.    [provider]  ?levocetirizine (XYZAL) 5 MG tablet Take 5 mg by mouth every evening.    [provider]  ?losartan (COZAAR) 50 MG tablet Take 1 tablet (50 mg total) by mouth daily. 06/30/21 06/25/22  Loel Dubonnet, NP  ?Misc Natural Products (GLUCOSAMINE CHONDROITIN TRIPLE) TABS Take 2 tablets by mouth daily.    [provider]  ?montelukast (SINGULAIR) 10 MG tablet Take 1 tablet (10 mg total) by mouth at bedtime. 06/21/19   Buford Dresser, MD  ?Multiple Vitamin (MULTIVITAMIN) tablet Take 1 tablet by mouth daily.    [provider]  ?Omega-3 Fatty Acids (FISH OIL) 1000 MG CAPS Take 1,000 mg by mouth daily.    [provider]  ?rivaroxaban (XARELTO) 20 MG TABS tablet Take 1 tablet (20 mg total) by mouth daily with supper. 09/01/21   Thompson Grayer, MD  ?   ? ?Allergies    ?Patient has no known allergies.   ? ?Review of Systems   ?Review of Systems ? ?Physical Exam ?Updated Vital Signs ?BP (!) 150/91   Pulse 62   Temp 97.9 ?F (36.6 ?C) (Oral)   Resp 14   Ht '5\' 10"'$  (1.778 m)   Wt 102.1 kg   SpO2 96%   BMI 32.28 kg/m?  ?Physical Exam ?Vitals and nursing note reviewed.  ?Constitutional:   ?   General: He  is not in acute distress. ?   Appearance: He is not ill-appearing.  ?HENT:  ?   Head: Atraumatic.  ?Eyes:  ?   Conjunctiva/sclera: Conjunctivae normal.  ?Cardiovascular:  ?   Rate and Rhythm: Normal rate and regular rhythm.  ?   Pulses: Normal pulses.  ?   Heart sounds: No murmur heard. ?Pulmonary:  ?   Effort: Pulmonary effort is normal. No respiratory distress.  ?   Breath sounds: Normal breath sounds.  ?Abdominal:  ?   General: Abdomen is flat. There is no distension.  ?   Palpations: Abdomen is soft.  ?   Tenderness: There is no abdominal tenderness.  ?Musculoskeletal:     ?   General: Normal range of motion.  ?   Cervical back: Normal  range of motion.  ?Skin: ?   General: Skin is warm and dry.  ?   Capillary Refill: Capillary refill takes less than 2 seconds.  ?Neurological:  ?   General: No focal deficit present.  ?   Mental Status: He is alert.  ?   Comments: Speech is clear, able to follow commands ?CN III-XII intact ?Normal strength in upper and lower extremities bilaterally including dorsiflexion and plantar flexion, strong and equal grip strength ?Sensation normal to light and sharp touch ?Moves extremities without ataxia, coordination intact ?Normal finger to nose and rapid alternating movements ?No pronator drift ?   ?Psychiatric:     ?   Mood and Affect: Mood normal.  ? ? ?ED Results / Procedures / Treatments   ?Labs ?(all labs ordered are listed, but only abnormal results are displayed) ?Labs Reviewed  ?COMPREHENSIVE METABOLIC PANEL - Abnormal; Notable for the following components:  ?    Result Value  ? Glucose, Bld 100 (*)   ? All other components within normal limits  ?URINALYSIS, ROUTINE W REFLEX MICROSCOPIC - Abnormal; Notable for the following components:  ? Hgb urine dipstick MODERATE (*)   ? All other components within normal limits  ?URINALYSIS, MICROSCOPIC (REFLEX) - Abnormal; Notable for the following components:  ? Bacteria, UA RARE (*)   ? All other components within normal limits  ?CBC WITH DIFFERENTIAL/PLATELET  ? ? ?EKG ?None ? ?Radiology ?CT Head Wo Contrast ? ?Result Date: 10/17/2021 ?CLINICAL DATA:  Altered mental status. EXAM: CT HEAD WITHOUT CONTRAST TECHNIQUE: Contiguous axial images were obtained from the base of the skull through the vertex without intravenous contrast. RADIATION DOSE REDUCTION: This exam was performed according to the departmental dose-optimization program which includes automated exposure control, adjustment of the mA and/or kV according to patient size and/or use of iterative reconstruction technique. COMPARISON:  None. FINDINGS: Brain: There is mild cerebral atrophy with widening of the  extra-axial spaces and ventricular dilatation. There are areas of decreased attenuation within the white matter tracts of the supratentorial brain, consistent with microvascular disease changes. Vascular: No hyperdense vessel or unexpected calcification. Skull: Normal. Negative for fracture or focal lesion. Sinuses/Orbits: No acute finding. Other: None. IMPRESSION: 1. No acute intracranial abnormality. 2. Generalized cerebral atrophy. Electronically Signed   By: Virgina Norfolk M.D.   On: 10/17/2021 20:19   ? ?Procedures ?Procedures  ? ? ?Medications Ordered in ED ?Medications - No data to display ? ?ED Course/ Medical Decision Making/ A&P ?  ?                        ?Medical Decision Making ?Amount and/or Complexity of Data Reviewed ?Labs: ordered. ?Radiology: ordered. ? ? ?  History:  ?Per HPI ?Social determinants of health: None ? ?Initial impression: ? ?This patient presents to the ED for concern of memory loss, this involves an extensive number of treatment options, and is a complaint that carries with it a high risk of complications and morbidity.   Emergent differentials include stroke, TIA, head trauma ? ? ?Lab Tests and EKG: ? ?I Ordered, reviewed, and interpreted labs and EKG.  The pertinent results include:  ?CMP, CBC and UA all normal ? ? ?Imaging Studies ordered: ? ?I ordered imaging studies including  ?CT head without acute findings  ?I independently visualized and interpreted imaging and I agree with the radiologist interpretation.  ? ? ?Cardiac Monitoring: ? ?The patient was maintained on a cardiac monitor.  I personally viewed and interpreted the cardiac monitored which showed an underlying rhythm of: NSR ? ? ? ?ED Course: ?Pleasant 76 year old male in no acute distress, presenting to the ED for evaluation of transient memory loss from the events that happened earlier today and yesterday.  Physical exam is benign.  Normal neuro exam.  CT head without acute findings aside from age-related changes  such as cerebral atrophy.  Labs normal.  Given normal clinical findings and MRI findings, patient can likely follow-up with PCP. ? ?Disposition: ? ?After consideration of the diagnostic results, physical exam, history and the p

## 2021-11-22 ENCOUNTER — Encounter (HOSPITAL_BASED_OUTPATIENT_CLINIC_OR_DEPARTMENT_OTHER): Payer: Self-pay

## 2021-11-24 NOTE — Telephone Encounter (Signed)
Please advise 

## 2021-11-25 NOTE — Telephone Encounter (Signed)
FYI

## 2022-02-01 ENCOUNTER — Encounter (HOSPITAL_BASED_OUTPATIENT_CLINIC_OR_DEPARTMENT_OTHER): Payer: Self-pay | Admitting: Emergency Medicine

## 2022-02-01 ENCOUNTER — Other Ambulatory Visit: Payer: Self-pay

## 2022-02-01 ENCOUNTER — Emergency Department (HOSPITAL_COMMUNITY): Payer: Medicare Other

## 2022-02-01 ENCOUNTER — Emergency Department (HOSPITAL_BASED_OUTPATIENT_CLINIC_OR_DEPARTMENT_OTHER)
Admission: EM | Admit: 2022-02-01 | Discharge: 2022-02-01 | Disposition: A | Discharge status: Home / Self Care | Payer: Medicare Other | Attending: Emergency Medicine | Admitting: Emergency Medicine

## 2022-02-01 DIAGNOSIS — R7989 Other specified abnormal findings of blood chemistry: Secondary | ICD-10-CM | POA: Insufficient documentation

## 2022-02-01 DIAGNOSIS — R39198 Other difficulties with micturition: Secondary | ICD-10-CM | POA: Insufficient documentation

## 2022-02-01 DIAGNOSIS — R319 Hematuria, unspecified: Secondary | ICD-10-CM | POA: Diagnosis not present

## 2022-02-01 DIAGNOSIS — N200 Calculus of kidney: Secondary | ICD-10-CM

## 2022-02-01 DIAGNOSIS — M899 Disorder of bone, unspecified: Secondary | ICD-10-CM

## 2022-02-01 DIAGNOSIS — Z7901 Long term (current) use of anticoagulants: Secondary | ICD-10-CM | POA: Insufficient documentation

## 2022-02-01 DIAGNOSIS — R110 Nausea: Secondary | ICD-10-CM | POA: Insufficient documentation

## 2022-02-01 DIAGNOSIS — R1032 Left lower quadrant pain: Secondary | ICD-10-CM | POA: Insufficient documentation

## 2022-02-01 DIAGNOSIS — R3129 Other microscopic hematuria: Secondary | ICD-10-CM

## 2022-02-01 LAB — COMPREHENSIVE METABOLIC PANEL
ALT: 27 U/L (ref 0–44)
AST: 32 U/L (ref 15–41)
Albumin: 4.2 g/dL (ref 3.5–5.0)
Alkaline Phosphatase: 107 U/L (ref 38–126)
Anion gap: 11 (ref 5–15)
BUN: 24 mg/dL — ABNORMAL HIGH (ref 8–23)
CO2: 21 mmol/L — ABNORMAL LOW (ref 22–32)
Calcium: 9.1 mg/dL (ref 8.9–10.3)
Chloride: 106 mmol/L (ref 98–111)
Creatinine, Ser: 1.3 mg/dL — ABNORMAL HIGH (ref 0.61–1.24)
GFR, Estimated: 57 mL/min — ABNORMAL LOW (ref >60)
Glucose, Bld: 165 mg/dL — ABNORMAL HIGH (ref 70–99)
Potassium: 3.5 mmol/L (ref 3.5–5.1)
Sodium: 138 mmol/L (ref 135–145)
Total Bilirubin: 0.8 mg/dL (ref 0.3–1.2)
Total Protein: 6.8 g/dL (ref 6.5–8.1)

## 2022-02-01 LAB — CBC WITH DIFFERENTIAL/PLATELET
Abs Immature Granulocytes: 0.02 10*3/uL (ref 0.00–0.07)
Basophils Absolute: 0 10*3/uL (ref 0.0–0.1)
Basophils Relative: 0 %
Eosinophils Absolute: 0.2 10*3/uL (ref 0.0–0.5)
Eosinophils Relative: 2 %
HCT: 45.1 % (ref 39.0–52.0)
Hemoglobin: 15.9 g/dL (ref 13.0–17.0)
Immature Granulocytes: 0 %
Lymphocytes Relative: 18 %
Lymphs Abs: 1.7 10*3/uL (ref 0.7–4.0)
MCH: 32.7 pg (ref 26.0–34.0)
MCHC: 35.3 g/dL (ref 30.0–36.0)
MCV: 92.8 fL (ref 80.0–100.0)
Monocytes Absolute: 0.8 10*3/uL (ref 0.1–1.0)
Monocytes Relative: 8 %
Neutro Abs: 6.7 10*3/uL (ref 1.7–7.7)
Neutrophils Relative %: 72 %
Platelets: 184 10*3/uL (ref 150–400)
RBC: 4.86 MIL/uL (ref 4.22–5.81)
RDW: 13 % (ref 11.5–15.5)
WBC: 9.4 10*3/uL (ref 4.0–10.5)
nRBC: 0 % (ref 0.0–0.2)

## 2022-02-01 LAB — URINALYSIS, ROUTINE W REFLEX MICROSCOPIC
Bilirubin Urine: NEGATIVE
Glucose, UA: NEGATIVE mg/dL
Ketones, ur: 15 mg/dL — AB
Leukocytes,Ua: NEGATIVE
Nitrite: NEGATIVE
Protein, ur: NEGATIVE mg/dL
Specific Gravity, Urine: 1.02 (ref 1.005–1.030)
pH: 5.5 (ref 5.0–8.0)

## 2022-02-01 LAB — URINALYSIS, MICROSCOPIC (REFLEX)

## 2022-02-01 LAB — MAGNESIUM: Magnesium: 2.1 mg/dL (ref 1.7–2.4)

## 2022-02-01 MED ORDER — KETOROLAC TROMETHAMINE 30 MG/ML IJ SOLN
15.0000 mg | Freq: Once | INTRAMUSCULAR | Status: AC
  Administered 2022-02-01: 15 mg via INTRAVENOUS
  Filled 2022-02-01: qty 1

## 2022-02-01 MED ORDER — SODIUM CHLORIDE 0.9 % IV BOLUS
500.0000 mL | Freq: Once | INTRAVENOUS | Status: AC
  Administered 2022-02-01: 500 mL via INTRAVENOUS

## 2022-02-01 MED ORDER — HYDROCODONE-ACETAMINOPHEN 5-325 MG PO TABS: 1.0000 | ORAL_TABLET | Freq: Four times a day (QID) | ORAL | 0 refills | Status: DC | PRN

## 2022-02-01 MED ORDER — FENTANYL CITRATE PF 50 MCG/ML IJ SOSY
50.0000 ug | PREFILLED_SYRINGE | Freq: Once | INTRAMUSCULAR | Status: AC
  Administered 2022-02-01: 50 ug via INTRAVENOUS
  Filled 2022-02-01: qty 1

## 2022-02-01 MED ORDER — ONDANSETRON HCL 4 MG/2ML IJ SOLN
4.0000 mg | Freq: Once | INTRAMUSCULAR | Status: AC
  Administered 2022-02-01: 4 mg via INTRAVENOUS
  Filled 2022-02-01: qty 2

## 2022-02-01 MED ORDER — ALUM & MAG HYDROXIDE-SIMETH 200-200-20 MG/5ML PO SUSP
30.0000 mL | Freq: Once | ORAL | Status: AC
  Administered 2022-02-01: 30 mL via ORAL
  Filled 2022-02-01: qty 30

## 2022-02-01 NOTE — ED Notes (Signed)
Output 12m

## 2022-02-01 NOTE — ED Provider Notes (Signed)
Patient received in transfer from [facility] for CT scan to rule out kidney stone.  CT is notable for (2) 3 mm stones, 1 in the bladder, one of the UVJ.  Patient's pain is well controlled.  I discussed the CT scan results with the patient and his wife.  He is stable for discharge.  Physical Exam  BP (!) 161/96   Pulse 91   Temp 98.6 F (37 C) (Oral)   Resp 20   Ht '5\' 10"'$  (1.778 m)   Wt 102.1 kg   SpO2 95%   BMI 32.28 kg/m   Physical Exam Vitals and nursing note reviewed.  Constitutional:      General: He is not in acute distress.    Appearance: He is well-developed. He is not ill-appearing.  HENT:     Head: Normocephalic and atraumatic.  Eyes:     Conjunctiva/sclera: Conjunctivae normal.  Cardiovascular:     Rate and Rhythm: Normal rate.  Pulmonary:     Effort: Pulmonary effort is normal. No respiratory distress.  Abdominal:     General: There is no distension.  Musculoskeletal:     Cervical back: Neck supple.     Comments: Moves all extremities  Skin:    General: Skin is warm and dry.  Neurological:     Mental Status: He is alert and oriented to person, place, and time.  Psychiatric:        Mood and Affect: Mood normal.        Behavior: Behavior normal.     Procedures  Procedures  ED Course / MDM    Medical Decision Making Amount and/or Complexity of Data Reviewed Labs: ordered. Radiology: ordered and independent interpretation performed.    Details: kidney stone in bladder, ? stone at UVJ  Risk OTC drugs. Prescription drug management.          Montine Circle, PA-C 02/01/22 [id number]    Molpus, Jenny Reichmann, MD 02/01/22 [phone]

## 2022-02-01 NOTE — ED Provider Notes (Addendum)
[facility] EMERGENCY DEPARTMENT Provider Note   CSN: [id number] Arrival date & time: 02/01/22  0254     History  Chief Complaint  Patient presents with   Abdominal Pain    Charles Cameron is a 76 y.o. male.  The history is provided by the patient.  Abdominal Pain Pain location:  LLQ Pain quality: sharp   Pain radiates to:  L flank Pain severity:  Severe Onset quality:  Sudden Duration:  5 hours Timing:  Constant Progression:  Unchanged Chronicity:  New Context comment:  Took mag citrate in response to pain but has been having normal BMs Relieved by:  Nothing Worsened by:  Nothing Ineffective treatments: mag citrate. Associated symptoms: hematuria and nausea   Associated symptoms: no constipation and no fever   Associated symptoms comment:  Difficulty urinating  Risk factors: being elderly        Home Medications Prior to Admission medications   Medication Sig Start Date End Date Taking? Authorizing Provider  Ascorbic Acid (VITAMIN C) 1000 MG tablet Take 1,000 mg by mouth daily.    [provider]  Cyanocobalamin (VITAMIN B-12) 5000 MCG SUBL Place 1 tablet under the tongue daily in the afternoon.    [provider]  diltiazem (CARDIZEM CD) 180 MG 24 hr capsule Take 1 capsule (180 mg total) by mouth daily. Discontinue after 6 weeks 08/29/21 10/10/21  Allred, Jeneen Rinks, MD  diltiazem (CARDIZEM) 30 MG tablet Take 1 tablet (30 mg total) by mouth 4 (four) times daily as needed (tachycardia). 02/03/21   Caccavale, Sophia, PA-C  ezetimibe (ZETIA) 10 MG tablet Take 10 mg by mouth daily. 08/05/21   [provider]  ibuprofen (ADVIL) 200 MG tablet Take 600 mg by mouth every 6 (six) hours as needed for moderate pain.    [provider]  levocetirizine (XYZAL) 5 MG tablet Take 5 mg by mouth every evening.    [provider]  losartan (COZAAR) 50 MG tablet Take 1 tablet (50 mg total) by mouth daily. 06/30/21 06/25/22  Loel Dubonnet, NP  Misc Natural Products (GLUCOSAMINE CHONDROITIN TRIPLE) TABS Take 2 tablets by mouth daily.    [provider]  montelukast (SINGULAIR) 10 MG tablet Take 1 tablet (10 mg total) by mouth at bedtime. 06/21/19   Buford Dresser, MD  Multiple Vitamin (MULTIVITAMIN) tablet Take 1 tablet by mouth daily.    [provider]  Omega-3 Fatty Acids (FISH OIL) 1000 MG CAPS Take 1,000 mg by mouth daily.    [provider]  rivaroxaban (XARELTO) 20 MG TABS tablet Take 1 tablet (20 mg total) by mouth daily with supper. 09/01/21   Thompson Grayer, MD      Allergies    Patient has no known allergies.    Review of Systems   Review of Systems  Constitutional:  Negative for fever.  HENT:  Negative for facial swelling.   Eyes:  Negative for redness.  Gastrointestinal:  Positive for abdominal pain and nausea. Negative for constipation.  Genitourinary:  Positive for difficulty urinating and hematuria.  All other systems reviewed and are negative.   Physical Exam Updated Vital Signs BP (!) 178/99 (BP Location: Right Arm)   Pulse 87   Temp 98.6 F (37 C) (Oral)   Resp 20   Ht '5\' 10"'$  (1.778 m)   Wt 102.1 kg   SpO2 100%   BMI 32.28 kg/m  Physical Exam Vitals and nursing note reviewed. Exam conducted with a chaperone present.  Constitutional:  General: He is not in acute distress.    Appearance: He is well-developed. He is not diaphoretic.  HENT:     Head: Normocephalic and atraumatic.     Nose: Nose normal.  Eyes:     Conjunctiva/sclera: Conjunctivae normal.     Pupils: Pupils are equal, round, and reactive to light.  Cardiovascular:     Rate and Rhythm: Normal rate and regular rhythm.     Pulses: Normal pulses.     Heart sounds: Normal heart sounds.  Pulmonary:     Effort: Pulmonary effort is normal.     Breath sounds: Normal breath sounds. No wheezing or rales.  Abdominal:     General: Bowel sounds are normal.     Palpations: Abdomen is soft.      Tenderness: There is no abdominal tenderness. There is no guarding or rebound.  Musculoskeletal:        General: Normal range of motion.     Cervical back: Normal range of motion and neck supple.  Skin:    General: Skin is warm and dry.     Capillary Refill: Capillary refill takes less than 2 seconds.  Neurological:     General: No focal deficit present.     Mental Status: He is alert and oriented to person, place, and time.  Psychiatric:        Mood and Affect: Mood normal.        Behavior: Behavior normal.     ED Results / Procedures / Treatments   Labs (all labs ordered are listed, but only abnormal results are displayed) Results for orders placed or performed during the hospital encounter of 02/01/22  CBC with Differential  Result Value Ref Range   WBC 9.4 4.0 - 10.5 K/uL   RBC 4.86 4.22 - 5.81 MIL/uL   Hemoglobin 15.9 13.0 - 17.0 g/dL   HCT 45.1 39.0 - 52.0 %   MCV 92.8 80.0 - 100.0 fL   MCH 32.7 26.0 - 34.0 pg   MCHC 35.3 30.0 - 36.0 g/dL   RDW 13.0 11.5 - 15.5 %   Platelets 184 150 - 400 K/uL   nRBC 0.0 0.0 - 0.2 %   Neutrophils Relative % 72 %   Neutro Abs 6.7 1.7 - 7.7 K/uL   Lymphocytes Relative 18 %   Lymphs Abs 1.7 0.7 - 4.0 K/uL   Monocytes Relative 8 %   Monocytes Absolute 0.8 0.1 - 1.0 K/uL   Eosinophils Relative 2 %   Eosinophils Absolute 0.2 0.0 - 0.5 K/uL   Basophils Relative 0 %   Basophils Absolute 0.0 0.0 - 0.1 K/uL   Immature Granulocytes 0 %   Abs Immature Granulocytes 0.02 0.00 - 0.07 K/uL  Urinalysis, Routine w reflex microscopic Urine, Clean Catch  Result Value Ref Range   Color, Urine YELLOW YELLOW   APPearance CLEAR CLEAR   Specific Gravity, Urine 1.020 1.005 - 1.030   pH 5.5 5.0 - 8.0   Glucose, UA NEGATIVE NEGATIVE mg/dL   Hgb urine dipstick LARGE (A) NEGATIVE   Bilirubin Urine NEGATIVE NEGATIVE   Ketones, ur 15 (A) NEGATIVE mg/dL   Protein, ur NEGATIVE NEGATIVE mg/dL   Nitrite NEGATIVE NEGATIVE   Leukocytes,Ua NEGATIVE  NEGATIVE  Urinalysis, Microscopic (reflex)  Result Value Ref Range   RBC / HPF 11-20 0 - 5 RBC/hpf   WBC, UA 0-5 0 - 5 WBC/hpf   Bacteria, UA RARE (A) NONE SEEN   Squamous Epithelial / LPF 0-5 0 - 5  No results found.   Radiology No results found.  Procedures Procedures    Medications Ordered in ED Medications  fentaNYL (SUBLIMAZE) injection 50 mcg (has no administration in time range)  ketorolac (TORADOL) 30 MG/ML injection 15 mg (15 mg Intravenous Given 02/01/22 0338)  ondansetron (ZOFRAN) injection 4 mg (4 mg Intravenous Given 02/01/22 0338)  alum & mag hydroxide-simeth (MAALOX/MYLANTA) 200-200-20 MG/5ML suspension 30 mL (30 mLs Oral Given 02/01/22 0338)  sodium chloride 0.9 % bolus 500 mL (500 mLs Intravenous New Bag/Given 02/01/22 1583)    ED Course/ Medical Decision Making/ A&P                           Medical Decision Making Patient with LLQ pain and left flank pain with associated nausea   Amount and/or Complexity of Data Reviewed External Data Reviewed: notes.    Details: previous notes reviewed Labs: ordered.    Details: all labs reviewed:  hematuria in urine, normal white count and hemoglobin, normal platelet count.  Normal potassium creatinine 1.3 slight elevation Radiology: ordered.    Details: CT ordered by me Discussion of management or test interpretation with external provider(s): Case d/w Dr. Florina Ou who will accept the patient in transfer   Risk OTC drugs. Prescription drug management. Parenteral controlled substances.    Final Clinical Impression(s) / ED Diagnoses Final diagnoses:  None   Transfer POV to [facility] for CT scan, CT has been ordered      Camden Knotek, MD 02/01/22 0940

## 2022-02-01 NOTE — Discharge Instructions (Addendum)
You had 2 small kidney stones, approximately 3 mm in size.  One of the stones is now in your bladder, the other is about to enter the bladder.  You may still experience some residual pain from this today.  Please take pain medication as prescribed.  Use the urine strainer and if you are able to catch the stones, please take them to your doctor for analysis.  Your kidney function (creatinine) was slightly elevated today, this is likely because of the kidney stone.  I recommend that you have this rechecked in 1 week by your doctor.  You had an incidental finding  on your CT of some sclerotic foci (bony lesions), that are new or more enlarged from comparison imaging done in 2020.  You should discuss this finding with your doctor given your history of prostate cancer, you may need repeat PSA testing.  If your symptoms change or worsen, please return to the emergency department for repeat evaluation.

## 2022-02-01 NOTE — ED Triage Notes (Signed)
Sudden severe LLQ pain that started ~2300 last night. Pt reports hematuria, difficulty urinating since pain onset. Pt reports drinking a bottle of mag citrate thinking he needed to have a bm, LBM was Saturday (yesterday) am. Pt is normally very regular and saw no change in bowel habits lately. Also endorses nausea and diaphoresis.

## 2022-02-02 ENCOUNTER — Ambulatory Visit (HOSPITAL_BASED_OUTPATIENT_CLINIC_OR_DEPARTMENT_OTHER)
Admission: EM | Admit: 2022-02-02 | Discharge: 2022-02-02 | Disposition: A | Discharge status: Home / Self Care | Payer: Medicare Other | Attending: Emergency Medicine | Admitting: Emergency Medicine

## 2022-02-02 ENCOUNTER — Emergency Department (EMERGENCY_DEPARTMENT_HOSPITAL): Payer: Medicare Other | Admitting: Certified Registered Nurse Anesthetist

## 2022-02-02 ENCOUNTER — Encounter (HOSPITAL_BASED_OUTPATIENT_CLINIC_OR_DEPARTMENT_OTHER): Payer: Self-pay | Admitting: Pharmacy Technician

## 2022-02-02 ENCOUNTER — Other Ambulatory Visit: Payer: Self-pay | Admitting: Urology

## 2022-02-02 ENCOUNTER — Emergency Department (HOSPITAL_COMMUNITY): Payer: Medicare Other | Admitting: Certified Registered Nurse Anesthetist

## 2022-02-02 ENCOUNTER — Emergency Department (HOSPITAL_COMMUNITY): Payer: Medicare Other

## 2022-02-02 ENCOUNTER — Encounter (HOSPITAL_COMMUNITY)
Admission: EM | Disposition: A | Discharge status: Home / Self Care | Payer: Self-pay | Source: Home / Self Care | Attending: Emergency Medicine

## 2022-02-02 ENCOUNTER — Emergency Department (HOSPITAL_BASED_OUTPATIENT_CLINIC_OR_DEPARTMENT_OTHER): Payer: Medicare Other

## 2022-02-02 ENCOUNTER — Other Ambulatory Visit: Payer: Self-pay

## 2022-02-02 DIAGNOSIS — Z87891 Personal history of nicotine dependence: Secondary | ICD-10-CM | POA: Diagnosis not present

## 2022-02-02 DIAGNOSIS — N200 Calculus of kidney: Secondary | ICD-10-CM

## 2022-02-02 DIAGNOSIS — N132 Hydronephrosis with renal and ureteral calculous obstruction: Secondary | ICD-10-CM | POA: Diagnosis not present

## 2022-02-02 DIAGNOSIS — Z7901 Long term (current) use of anticoagulants: Secondary | ICD-10-CM | POA: Insufficient documentation

## 2022-02-02 DIAGNOSIS — Z8546 Personal history of malignant neoplasm of prostate: Secondary | ICD-10-CM | POA: Insufficient documentation

## 2022-02-02 DIAGNOSIS — I10 Essential (primary) hypertension: Secondary | ICD-10-CM | POA: Insufficient documentation

## 2022-02-02 DIAGNOSIS — Z923 Personal history of irradiation: Secondary | ICD-10-CM | POA: Diagnosis not present

## 2022-02-02 DIAGNOSIS — N179 Acute kidney failure, unspecified: Secondary | ICD-10-CM | POA: Diagnosis present

## 2022-02-02 DIAGNOSIS — N201 Calculus of ureter: Secondary | ICD-10-CM | POA: Diagnosis not present

## 2022-02-02 HISTORY — PX: CYSTOSCOPY WITH RETROGRADE PYELOGRAM, URETEROSCOPY AND STENT PLACEMENT: SHX5789

## 2022-02-02 LAB — CBC WITH DIFFERENTIAL/PLATELET
Abs Immature Granulocytes: 0.06 10*3/uL (ref 0.00–0.07)
Basophils Absolute: 0 10*3/uL (ref 0.0–0.1)
Basophils Relative: 0 %
Eosinophils Absolute: 0 10*3/uL (ref 0.0–0.5)
Eosinophils Relative: 0 %
HCT: 43.9 % (ref 39.0–52.0)
Hemoglobin: 15.2 g/dL (ref 13.0–17.0)
Immature Granulocytes: 0 %
Lymphocytes Relative: 6 %
Lymphs Abs: 0.9 10*3/uL (ref 0.7–4.0)
MCH: 32.2 pg (ref 26.0–34.0)
MCHC: 34.6 g/dL (ref 30.0–36.0)
MCV: 93 fL (ref 80.0–100.0)
Monocytes Absolute: 1.1 10*3/uL — ABNORMAL HIGH (ref 0.1–1.0)
Monocytes Relative: 7 %
Neutro Abs: 14.6 10*3/uL — ABNORMAL HIGH (ref 1.7–7.7)
Neutrophils Relative %: 87 %
Platelets: 164 10*3/uL (ref 150–400)
RBC: 4.72 MIL/uL (ref 4.22–5.81)
RDW: 13.1 % (ref 11.5–15.5)
WBC: 16.8 10*3/uL — ABNORMAL HIGH (ref 4.0–10.5)
nRBC: 0 % (ref 0.0–0.2)

## 2022-02-02 LAB — BASIC METABOLIC PANEL
Anion gap: 10 (ref 5–15)
BUN: 22 mg/dL (ref 8–23)
CO2: 23 mmol/L (ref 22–32)
Calcium: 8.8 mg/dL — ABNORMAL LOW (ref 8.9–10.3)
Chloride: 101 mmol/L (ref 98–111)
Creatinine, Ser: 1.75 mg/dL — ABNORMAL HIGH (ref 0.61–1.24)
GFR, Estimated: 40 mL/min — ABNORMAL LOW (ref >60)
Glucose, Bld: 169 mg/dL — ABNORMAL HIGH (ref 70–99)
Potassium: 4.1 mmol/L (ref 3.5–5.1)
Sodium: 134 mmol/L — ABNORMAL LOW (ref 135–145)

## 2022-02-02 LAB — LACTIC ACID, PLASMA
Lactic Acid, Venous: 2 mmol/L (ref 0.5–1.9)
Lactic Acid, Venous: 1.8 mmol/L (ref 0.5–1.9)

## 2022-02-02 LAB — URINALYSIS, ROUTINE W REFLEX MICROSCOPIC
Bilirubin Urine: NEGATIVE
Glucose, UA: NEGATIVE mg/dL
Ketones, ur: 15 mg/dL — AB
Leukocytes,Ua: NEGATIVE
Nitrite: NEGATIVE
Protein, ur: 30 mg/dL — AB
Specific Gravity, Urine: 1.02 (ref 1.005–1.030)
pH: 6.5 (ref 5.0–8.0)

## 2022-02-02 LAB — URINALYSIS, MICROSCOPIC (REFLEX): RBC / HPF: >50 RBC/hpf (ref 0–5)

## 2022-02-02 SURGERY — CYSTOURETEROSCOPY, WITH RETROGRADE PYELOGRAM AND STENT INSERTION
Anesthesia: General | Laterality: Left

## 2022-02-02 MED ORDER — FENTANYL CITRATE PF 50 MCG/ML IJ SOSY: 25.0000 ug | PREFILLED_SYRINGE | INTRAMUSCULAR | Status: DC | PRN

## 2022-02-02 MED ORDER — TAMSULOSIN HCL 0.4 MG PO CAPS: 0.4000 mg | ORAL_CAPSULE | Freq: Every day | ORAL | 0 refills | Status: DC

## 2022-02-02 MED ORDER — LIDOCAINE 2% (20 MG/ML) 5 ML SYRINGE
INTRAMUSCULAR | Status: DC | PRN
  Administered 2022-02-02: 60 mg via INTRAVENOUS

## 2022-02-02 MED ORDER — GLYCERIN (LAXATIVE) 2.1 G RE SUPP
1.0000 | Freq: Once | RECTAL | Status: DC
  Filled 2022-02-02: qty 1

## 2022-02-02 MED ORDER — IOHEXOL 300 MG/ML  SOLN
INTRAMUSCULAR | Status: DC | PRN
  Administered 2022-02-02: 9 mL

## 2022-02-02 MED ORDER — PROPOFOL 10 MG/ML IV BOLUS
INTRAVENOUS | Status: DC | PRN
  Administered 2022-02-02: 150 mg via INTRAVENOUS

## 2022-02-02 MED ORDER — PHENYLEPHRINE HCL (PRESSORS) 10 MG/ML IV SOLN
INTRAVENOUS | Status: DC | PRN
  Administered 2022-02-02 (×3): 80 ug via INTRAVENOUS

## 2022-02-02 MED ORDER — CHLORHEXIDINE GLUCONATE 0.12 % MT SOLN
15.0000 mL | Freq: Once | OROMUCOSAL | Status: AC
  Administered 2022-02-02: 15 mL via OROMUCOSAL

## 2022-02-02 MED ORDER — ACETAMINOPHEN 10 MG/ML IV SOLN: 1000.0000 mg | Freq: Once | INTRAVENOUS | Status: DC | PRN

## 2022-02-02 MED ORDER — LACTATED RINGERS IV SOLN: INTRAVENOUS | Status: DC | PRN

## 2022-02-02 MED ORDER — 0.9 % SODIUM CHLORIDE (POUR BTL) OPTIME
TOPICAL | Status: DC | PRN
  Administered 2022-02-02: 1000 mL

## 2022-02-02 MED ORDER — OXYCODONE HCL 5 MG/5ML PO SOLN: 5.0000 mg | Freq: Once | ORAL | Status: DC | PRN

## 2022-02-02 MED ORDER — LIDOCAINE HCL (PF) 2 % IJ SOLN
INTRAMUSCULAR | Status: AC
  Filled 2022-02-02: qty 5

## 2022-02-02 MED ORDER — KETOROLAC TROMETHAMINE 15 MG/ML IJ SOLN
15.0000 mg | Freq: Once | INTRAMUSCULAR | Status: AC
  Administered 2022-02-02: 15 mg via INTRAVENOUS
  Filled 2022-02-02: qty 1

## 2022-02-02 MED ORDER — HYDROCODONE-ACETAMINOPHEN 5-325 MG PO TABS: 1.0000 | ORAL_TABLET | Freq: Four times a day (QID) | ORAL | 0 refills | Status: DC | PRN

## 2022-02-02 MED ORDER — ONDANSETRON HCL 4 MG/2ML IJ SOLN
INTRAMUSCULAR | Status: DC | PRN
  Administered 2022-02-02: 4 mg via INTRAVENOUS

## 2022-02-02 MED ORDER — FENTANYL CITRATE (PF) 100 MCG/2ML IJ SOLN
INTRAMUSCULAR | Status: AC
  Filled 2022-02-02: qty 2

## 2022-02-02 MED ORDER — CEFAZOLIN SODIUM-DEXTROSE 2-4 GM/100ML-% IV SOLN
2.0000 g | INTRAVENOUS | Status: AC
  Administered 2022-02-02: 2 g via INTRAVENOUS
  Filled 2022-02-02: qty 100

## 2022-02-02 MED ORDER — ONDANSETRON HCL 4 MG/2ML IJ SOLN
INTRAMUSCULAR | Status: AC
  Filled 2022-02-02: qty 2

## 2022-02-02 MED ORDER — IOHEXOL 300 MG/ML  SOLN
100.0000 mL | Freq: Once | INTRAMUSCULAR | Status: AC | PRN
  Administered 2022-02-02: 80 mL via INTRAVENOUS

## 2022-02-02 MED ORDER — FENTANYL CITRATE PF 50 MCG/ML IJ SOSY
50.0000 ug | PREFILLED_SYRINGE | Freq: Once | INTRAMUSCULAR | Status: AC
  Administered 2022-02-02: 50 ug via INTRAVENOUS
  Filled 2022-02-02: qty 1

## 2022-02-02 MED ORDER — NITROFURANTOIN MONOHYD MACRO 100 MG PO CAPS: 100.0000 mg | ORAL_CAPSULE | Freq: Two times a day (BID) | ORAL | 0 refills | Status: AC

## 2022-02-02 MED ORDER — SODIUM CHLORIDE 0.9 % IV SOLN
2.0000 g | Freq: Once | INTRAVENOUS | Status: AC
  Administered 2022-02-02: 2 g via INTRAVENOUS
  Filled 2022-02-02: qty 20

## 2022-02-02 MED ORDER — MELOXICAM 15 MG PO TABS: 15.0000 mg | ORAL_TABLET | Freq: Every day | ORAL | 1 refills | Status: AC | PRN

## 2022-02-02 MED ORDER — ACETAMINOPHEN 160 MG/5ML PO SOLN: 1000.0000 mg | Freq: Once | ORAL | Status: DC | PRN

## 2022-02-02 MED ORDER — PROPOFOL 10 MG/ML IV BOLUS
INTRAVENOUS | Status: AC
  Filled 2022-02-02: qty 20

## 2022-02-02 MED ORDER — DEXAMETHASONE SODIUM PHOSPHATE 10 MG/ML IJ SOLN
INTRAMUSCULAR | Status: AC
  Filled 2022-02-02: qty 1

## 2022-02-02 MED ORDER — OXYCODONE HCL 5 MG PO TABS: 5.0000 mg | ORAL_TABLET | Freq: Once | ORAL | Status: DC | PRN

## 2022-02-02 MED ORDER — FENTANYL CITRATE (PF) 100 MCG/2ML IJ SOLN
INTRAMUSCULAR | Status: DC | PRN
  Administered 2022-02-02: 25 ug via INTRAVENOUS
  Administered 2022-02-02: 50 ug via INTRAVENOUS

## 2022-02-02 MED ORDER — ACETAMINOPHEN 500 MG PO TABS: 1000.0000 mg | ORAL_TABLET | Freq: Once | ORAL | Status: DC | PRN

## 2022-02-02 MED ORDER — SODIUM CHLORIDE 0.9 % IR SOLN
Status: DC | PRN
  Administered 2022-02-02: 3000 mL

## 2022-02-02 MED ORDER — LACTATED RINGERS IV BOLUS
1000.0000 mL | Freq: Once | INTRAVENOUS | Status: AC
  Administered 2022-02-02: 1000 mL via INTRAVENOUS

## 2022-02-02 SURGICAL SUPPLY — 31 items
APL SKNCLS STERI-STRIP NONHPOA (GAUZE/BANDAGES/DRESSINGS) ×1
BAG COUNTER SPONGE SURGICOUNT (BAG) IMPLANT
BAG SPNG CNTER NS LX DISP (BAG)
BAG URO CATCHER STRL LF (MISCELLANEOUS) ×2 IMPLANT
BASKET ZERO TIP NITINOL 2.4FR (BASKET) ×1 IMPLANT
BENZOIN TINCTURE PRP APPL 2/3 (GAUZE/BANDAGES/DRESSINGS) ×1 IMPLANT
BSKT STON RTRVL ZERO TP 2.4FR (BASKET) ×1
CATH URETERAL DUAL LUMEN 10F (MISCELLANEOUS) ×1 IMPLANT
CATH URETL OPEN END 6FR 70 (CATHETERS) ×1 IMPLANT
CLOTH BEACON ORANGE TIMEOUT ST (SAFETY) ×2 IMPLANT
DRSG TEGADERM 2-3/8X2-3/4 SM (GAUZE/BANDAGES/DRESSINGS) ×1 IMPLANT
GLOVE SS BIOGEL STRL SZ 7 (GLOVE) ×1 IMPLANT
GLOVE SUPERSENSE BIOGEL SZ 7 (GLOVE) ×1
GLOVE SURG LX 7.5 STRW (GLOVE) ×1
GLOVE SURG LX STRL 7.5 STRW (GLOVE) ×1 IMPLANT
GOWN STRL REUS W/ TWL XL LVL3 (GOWN DISPOSABLE) ×1 IMPLANT
GOWN STRL REUS W/TWL XL LVL3 (GOWN DISPOSABLE) ×2
GUIDEWIRE STR DUAL SENSOR (WIRE) ×2 IMPLANT
GUIDEWIRE ZIPWRE .038 STRAIGHT (WIRE) ×2 IMPLANT
IV NS 1000ML (IV SOLUTION) ×2
IV NS 1000ML BAXH (IV SOLUTION) ×1 IMPLANT
KIT TURNOVER KIT A (KITS) ×1 IMPLANT
LASER FIB FLEXIVA PULSE ID 365 (Laser) IMPLANT
MANIFOLD NEPTUNE II (INSTRUMENTS) ×2 IMPLANT
PACK CYSTO (CUSTOM PROCEDURE TRAY) ×2 IMPLANT
SHEATH NAVIGATOR HD 11/13X36 (SHEATH) IMPLANT
STENT URET 6FRX26 CONTOUR (STENTS) ×1 IMPLANT
TRACTIP FLEXIVA PULS ID 200XHI (Laser) IMPLANT
TRACTIP FLEXIVA PULSE ID 200 (Laser)
TUBING CONNECTING 10 (TUBING) ×2 IMPLANT
TUBING UROLOGY SET (TUBING) ×2 IMPLANT

## 2022-02-02 NOTE — Discharge Instructions (Addendum)
[facility] [phone] Post Ureteroscopy With or Without Stent Instructions *remove stent by pulling on string on Thursday morning  Definitions:  Ureter: The duct that transports urine from the kidney to the bladder. Stent:   A plastic hollow tube that is placed into the ureter, from the kidney to the bladder to prevent the ureter from swelling shut.  GENERAL INSTRUCTIONS:  Despite the fact that no skin incisions were used, the area around the ureter and bladder is raw and irritated. The stent is a foreign body which will further irritate the bladder wall. This irritation is manifested by increased frequency of urination, both day and night, and by an increase in the urge to urinate. In some, the urge to urinate is present almost always. Sometimes the urge is strong enough that you may not be able to stop yourself from urinating. The only real cure is to remove the stent and then give time for the bladder wall to heal which can't be done until the danger of the ureter swelling shut has passed, which varies.  You may see some blood in your urine while the stent is in place and a few days afterwards. Do not be alarmed, even if the urine was clear for a while. Get off your feet and drink lots of fluids until clearing occurs. If you start to pass clots or don't improve, call us.  DIET: You may return to your normal diet immediately. Because of the raw surface of your bladder, alcohol, spicy foods, acid type foods and drinks with caffeine may cause irritation or frequency and should be used in moderation. To keep your urine flowing freely and to avoid constipation, drink plenty of fluids during the day ( 8-10 glasses ). Tip: Avoid cranberry juice because it is very acidic.  ACTIVITY: Your physical activity doesn't need to be restricted. However, if you are very active, you may see some blood in your urine. We suggest that you reduce your activity under these circumstances until the  bleeding has stopped.  BOWELS: It is important to keep your bowels regular during the postoperative period. Straining with bowel movements can cause bleeding. A bowel movement every other day is reasonable. Use a mild laxative if needed, such as Milk of Magnesia 2-3 tablespoons, or 2 Dulcolax tablets. Call if you continue to have problems. If you have been taking narcotics for pain, before, during or after your surgery, you may be constipated. Take a laxative if necessary.   MEDICATION: You should resume your pre-surgery medications unless told not to. In addition you will often be given an antibiotic to prevent infection and likely several as needed medications for stent related discomfort. These should be taken as prescribed until the bottles are finished unless you are having an unusual reaction to one of the drugs.  PROBLEMS YOU SHOULD REPORT TO [place]: Fevers over 100.5 Fahrenheit. Heavy bleeding, or clots ( See above notes about blood in urine ). Inability to urinate. Drug reactions ( hives, rash, nausea, vomiting, diarrhea ). Severe burning or pain with urination that is not improving.

## 2022-02-02 NOTE — Op Note (Signed)
Operative Note  Preoperative diagnosis:  1.  Left ureteral stone 2. Left ureteral obstruction  Postoperative diagnosis: same  Procedure(s): 1.  Left ureteroscopy with laser lithotripsy and basket extraction of stones 2. Cystoscopy  3. Left retrograde pyelogram 4. Left ureteral stent placement (6x26 cm) 5. Fluoroscopy with intraoperative interpretation  Surgeon: Donald Pore, MD  Assistants:  None  Anesthesia:  General  Complications:  None  EBL:  Minimal  Specimens: 1. stones for stone analysis (to be done at [facility])  Drains/Catheters: 1.  6Fr x 26cm ureteral stent with tether string  Intraoperative findings:   Cystoscopy demonstrated unremarkable bladder with prostate absent. There was a small stone in the bladder that was removed Ureteroscopy demonstrated stone in distal ureter Successful stent placement.   Description of procedure: After informed consent was obtained from the patient, the patient was identified and taken to the operating room and placed in the supine position.  General anesthesia was administered as well as perioperative IV antibiotics.  At the beginning of the case, a time-out was performed to properly identify the patient, the surgery to be performed, and the surgical site.  Sequential compression devices were applied to the lower extremities at the beginning of the case for DVT prophylaxis.  The patient was then placed in the dorsal lithotomy supine position, prepped and draped in sterile fashion.  We then passed the 21-French rigid cystoscope through the urethra and into the bladder under vision without any difficulty , noting a normal urethra without strictures and a surgically absent prostate  A systematic evaluation of the bladder revealed no evidence of any suspicious bladder lesions.  Ureteral orifices were in normal position.     Under cystoscopic and flouroscopic guidance, we cannulated the left ureteral orifice with a 5-French  open-ended ureteral catheter and a gentle retrograde pyelogram was performed, revealing a normal caliber ureter without any filling defects. There was mild hydronephrosis of the collecting system. A 0.038 glide wire was then passed up to the level of the renal pelvis and secured to the drape as a safety wire. The ureteral catheter and cystoscope were removed, leaving the safety wire in place.   A semi-rigid ureteroscope was passed alongside the wire up the distal ureter which appeared normal. The stone was identified in the distal ureter and removed with the zero tip basket under direct visualization.  The semi-rigid ureteroscope was removed. the Glidewire was backloaded through the rigid cystoscope, which was then advanced down the urethra and into the bladder. We then used the Glidewire under direct vision through the rigid cystoscope and under fluoroscopic guidance and passed up a 6-French, 26 cm double-pigtail ureteral stent up ureter, making sure that the proximal and distal ends coiled within the kidney and bladder respectively.   Note that we left a  long tether string attached to the distal end of the ureteral stent and it exited the urethral meatus and was secured to the penile shaft.  The cystoscope was then advanced back into the bladder under vision.  We were able to see the distal stent coiling nicely within the bladder.  The bladder was then emptied with irrigation solution. There was a small bladder stone that was evacuated out through the bladder as well. The cystoscope was then removed.    The patient tolerated the procedure well and there was no complication. Patient was awoken from anesthesia and taken to the recovery room in stable condition. I was present and scrubbed for the entirety of the case.  Plan:  Patient will be discharged home with instructions to remove the stent by pulling on the string in 3 days   G. Donald Pore MD [facility]  Pager: [phone]

## 2022-02-02 NOTE — ED Triage Notes (Signed)
Pt here with suprapubic pain after being dx with kidney stones yesterday. Pt states pain medication is not helping. Pt also endorses some constipation with LBM being Saturday. Pt states he took mag citrate without relief.

## 2022-02-02 NOTE — ED Provider Notes (Signed)
[facility] EMERGENCY DEPARTMENT Provider Note   CSN: [id number] Arrival date & time: 02/02/22  2263     History  Chief Complaint  Patient presents with   Abdominal Pain    Charles Cameron is a 76 y.o. male.   Abdominal Pain   76 year old male with recent diagnosis of obstructing kidney stone who presents to the emergency department with worsening pain.  The patient endorses suprapubic pain as well as left flank pain.  He was diagnosed with a 3 mm obstructing UVJ stone on the left yesterday and was discharged with a plan for outpatient management.  He denies any fevers or chills.  He endorses persistent pain and states his pain medication is not helping.  He also endorses constipation with his last bowel movement being on Saturday.  He is not sure if he is passing gas.  He tried mag citrate without relief.  Home Medications Prior to Admission medications   Medication Sig Start Date End Date Taking? Authorizing Provider  amLODipine (NORVASC) 5 MG tablet Take 5 mg by mouth daily. 12/02/21   [provider]  Cyanocobalamin (VITAMIN B-12) 5000 MCG SUBL Place 1 tablet under the tongue daily in the afternoon.    [provider]  diltiazem (CARDIZEM CD) 180 MG 24 hr capsule Take 1 capsule (180 mg total) by mouth daily. Discontinue after 6 weeks Patient not taking: Reported on 02/01/2022 08/29/21 10/10/21  Thompson Grayer, MD  ezetimibe (ZETIA) 10 MG tablet Take 10 mg by mouth daily. 08/05/21   [provider]  HYDROcodone-acetaminophen (NORCO/VICODIN) 5-325 MG tablet Take 1-2 tablets by mouth every 6 (six) hours as needed. 02/01/22   Montine Circle, PA-C  ibuprofen (ADVIL) 200 MG tablet Take 600 mg by mouth every 6 (six) hours as needed for moderate pain.    [provider]  levocetirizine (XYZAL) 5 MG tablet Take 5 mg by mouth in the morning.    [provider]  losartan (COZAAR) 50 MG tablet Take 1 tablet (50 mg total) by mouth  daily. Patient taking differently: Take 100 mg by mouth daily. 06/30/21 06/25/22  Loel Dubonnet, NP  Misc Natural Products (GLUCOSAMINE CHONDROITIN TRIPLE) TABS Take 2 tablets by mouth daily.    [provider]  Multiple Vitamin (MULTIVITAMIN) tablet Take 1 tablet by mouth daily.    [provider]  Omega-3 Fatty Acids (FISH OIL) 1000 MG CAPS Take 1,000 mg by mouth daily.    [provider]  rivaroxaban (XARELTO) 20 MG TABS tablet Take 1 tablet (20 mg total) by mouth daily with supper. Patient taking differently: Take 20 mg by mouth daily. 09/01/21   Thompson Grayer, MD      Allergies    Patient has no known allergies.    Review of Systems   Review of Systems  Gastrointestinal:  Positive for abdominal pain.  All other systems reviewed and are negative.   Physical Exam Updated Vital Signs BP (!) 141/85   Pulse 90   Temp 98.2 F (36.8 C) (Oral)   Resp 17   SpO2 95%  Physical Exam Vitals and nursing note reviewed.  Constitutional:      General: He is not in acute distress.    Appearance: He is well-developed.  HENT:     Head: Normocephalic and atraumatic.  Eyes:     Conjunctiva/sclera: Conjunctivae normal.  Cardiovascular:     Rate and Rhythm: Normal rate and regular rhythm.     Heart sounds: No murmur heard. Pulmonary:  Effort: Pulmonary effort is normal. No respiratory distress.     Breath sounds: Normal breath sounds.  Abdominal:     Palpations: Abdomen is soft.     Tenderness: There is abdominal tenderness in the periumbilical area and left lower quadrant. There is left CVA tenderness and guarding. There is no rebound.  Musculoskeletal:        General: No swelling.     Cervical back: Neck supple.  Skin:    General: Skin is warm and dry.     Capillary Refill: Capillary refill takes less than 2 seconds.  Neurological:     Mental Status: He is alert.  Psychiatric:        Mood and Affect: Mood normal.     ED Results / Procedures /  Treatments   Labs (all labs ordered are listed, but only abnormal results are displayed) Labs Reviewed  URINALYSIS, ROUTINE W REFLEX MICROSCOPIC - Abnormal; Notable for the following components:      Result Value   Color, Urine AMBER (*)    APPearance CLOUDY (*)    Hgb urine dipstick LARGE (*)    Ketones, ur 15 (*)    Protein, ur 30 (*)    All other components within normal limits  CBC WITH DIFFERENTIAL/PLATELET - Abnormal; Notable for the following components:   WBC 16.8 (*)    Neutro Abs 14.6 (*)    Monocytes Absolute 1.1 (*)    All other components within normal limits  BASIC METABOLIC PANEL - Abnormal; Notable for the following components:   Sodium 134 (*)    Glucose, Bld 169 (*)    Creatinine, Ser 1.75 (*)    Calcium 8.8 (*)    GFR, Estimated 40 (*)    All other components within normal limits  LACTIC ACID, PLASMA - Abnormal; Notable for the following components:   Lactic Acid, Venous 2.0 (*)    All other components within normal limits  URINALYSIS, MICROSCOPIC (REFLEX) - Abnormal; Notable for the following components:   Bacteria, UA MANY (*)    All other components within normal limits  URINE CULTURE  LACTIC ACID, PLASMA    EKG None  Radiology CT ABDOMEN PELVIS W CONTRAST  Result Date: 02/02/2022 CLINICAL DATA:  Flank pain, kidney stone suspected. Nausea and vomiting. Left flank and left lower quadrant abdominal pain for 2 days. EXAM: CT ABDOMEN AND PELVIS WITH CONTRAST TECHNIQUE: Multidetector CT imaging of the abdomen and pelvis was performed using the standard protocol following bolus administration of intravenous contrast. RADIATION DOSE REDUCTION: This exam was performed according to the departmental dose-optimization program which includes automated exposure control, adjustment of the mA and/or kV according to patient size and/or use of iterative reconstruction technique. CONTRAST:  36m OMNIPAQUE IOHEXOL 300 MG/ML  SOLN COMPARISON:  Noncontrast abdominopelvic CT  02/01/2022. CT abdomen and pelvis with contrast 10/30/2018. FINDINGS: Lower chest: New subsegmental atelectasis at both lung bases. There is no confluent airspace opacity, pleural or pericardial effusion. Aortic atherosclerosis and aortic valvular calcifications are noted. Hepatobiliary: The liver is normal in density without suspicious focal abnormality. No evidence of gallstones, gallbladder wall thickening or biliary dilatation. Pancreas: Unremarkable. No pancreatic ductal dilatation or surrounding inflammatory changes. Spleen: Normal in size without focal abnormality. Adrenals/Urinary Tract: Bilateral adrenal nodularity is unchanged from 2020, attributed to adenomas based on stability. There is a nonobstructing calculus in the lower pole of the left kidney which measures up to 10 mm on coronal image 60/5. 3 mm obstructing calculus at the left ureterovesical  junction has not significantly changed. There is an adjacent small bladder calculus. There is persistent left-sided hydronephrosis and hydroureter with delayed contrast excretion and increased perinephric soft tissue stranding and fluid. The delayed post-contrast images demonstrate extravasation of contrast into the anterior and medial left perinephric space consistent with pyelosinus extravasation. Renal sinus cysts are present bilaterally, without evidence of right ureteral obstruction. Mild bladder wall thickening. Stomach/Bowel: No enteric contrast was administered. There is a small hiatal hernia. The stomach otherwise appears unremarkable for its degree of distention. No evidence of bowel distension, wall thickening or surrounding inflammation. The appendix appears normal. There are diverticular changes of the descending and sigmoid colon. Vascular/Lymphatic: There are no enlarged abdominal or pelvic lymph nodes. Aortic and branch vessel atherosclerosis without evidence of aneurysm. Reproductive: Previous prostatectomy. Other: No evidence of abdominal  wall mass or hernia. No ascites. Musculoskeletal: Scattered small sclerotic lesions are again noted within the bones. There are no acute osseous findings. There is multilevel spondylosis with ankylosis across the L5-S1 disc space. IMPRESSION: 1. Persistent obstruction at the left ureterovesical junction by a 3 mm calculus. Since the prior CT of yesterday, the patient has developed increased perinephric soft tissue stranding and fluid with delayed contrast extravasation consistent with pyelosinus extravasation. No significant urinoma at this time. Recommend urology consultation. 2. Additional nonobstructing calculi in the lower pole of the left kidney and within the bladder lumen. 3. 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 Distal colonic diverticulosis. 4.  Aortic Atherosclerosis (ICD10-I70.0). 5. New/enlarging sclerotic osseous lesions from 2020 as recently discussed. Correlate with serum PSA levels. Electronically Signed   By: Richardean Sale M.D.   On: 02/02/2022 08:48   CT Renal Stone Study  Result Date: 02/01/2022 CLINICAL DATA:  Flank pain with kidney stone suspected.  Hematuria EXAM: CT ABDOMEN AND PELVIS WITHOUT CONTRAST TECHNIQUE: Multidetector CT imaging of the abdomen and pelvis was performed following the standard protocol without IV contrast. RADIATION DOSE REDUCTION: This exam was performed according to the departmental dose-optimization program which includes automated exposure control, adjustment of the mA and/or kV according to patient size and/or use of iterative reconstruction technique. COMPARISON:  10/30/2018 FINDINGS: Lower chest:  No contributory findings. Hepatobiliary: No focal liver abnormality.No evidence of biliary obstruction or stone. Pancreas: Unremarkable. Spleen: Unremarkable. Adrenals/Urinary Tract: Nodular adrenal glands, the largest in the lateral limb on the left measuring 17 mm. The largest on the right measures 11 mm. These are stable from 2020 and  attributed to adenoma. Left perinephric stranding and hydroureteronephrosis due to a UVJ calculus which measures 3 mm. A similar size stone is layering in the urinary bladder. Left renal calculus at the lower pole measuring 8 mm. Renal sinus cysts. Unremarkable bladder. Stomach/Bowel:  No obstruction. Colonic diverticulosis. Vascular/Lymphatic: Atheromatous calcification of the aorta and iliacs. No mass or adenopathy. Reproductive:Prostatectomy. Other: No ascites or pneumoperitoneum. Musculoskeletal: Generalized lumbar spine degeneration. Bilateral hip osteoarthritis. 11 mm sclerotic area in the anterior inferior corner of the L1 vertebra, larger than in 2020. New even smaller rounded sclerotic focus in the lower half of the L1 vertebra. Small sclerotic focus in the right transverse process of T12, new. 1 cm sclerotic focus in the left sacral ala, larger than in 2020. IMPRESSION: 1. Obstructing 3 mm stone at the left UVJ. A similar size calculus is also present in the urinary bladder. 2. Few small sclerotic foci in the skeleton, larger/new since 2020, please correlate with PSA in this patient with history of prostate cancer. 3. Left nephrolithiasis. Electronically  Signed   By: Jorje Guild M.D.   On: 02/01/2022 05:24    Procedures Procedures    Medications Ordered in ED Medications  Glycerin (Adult) 2.1 g suppository 1 suppository (0 suppositories Rectal Hold 02/02/22 0737)  cefTRIAXone (ROCEPHIN) 2 g in sodium chloride 0.9 % 100 mL IVPB (has no administration in time range)  ketorolac (TORADOL) 15 MG/ML injection 15 mg (15 mg Intravenous Given 02/02/22 0733)  lactated ringers bolus 1,000 mL (1,000 mLs Intravenous New Bag/Given 02/02/22 0737)  fentaNYL (SUBLIMAZE) injection 50 mcg (50 mcg Intravenous Given 02/02/22 0733)  iohexol (OMNIPAQUE) 300 MG/ML solution 100 mL (80 mLs Intravenous Contrast Given 02/02/22 0819)    ED Course/ Medical Decision Making/ A&P Clinical Course as of 02/02/22 0915  Mon  Feb 02, 2022  0801 Creatinine(!): 1.75 [JL]  0801 WBC(!): 16.8 [JL]  0859 Bacteria, UA(!): MANY [JL]  0859 WBC, UA: 6-10 [JL]    Clinical Course User Index [JL] Regan Lemming, MD                           Medical Decision Making Amount and/or Complexity of Data Reviewed Labs: ordered. Decision-making details documented in ED Course. Radiology: ordered.  Risk OTC drugs. Prescription drug management.    76 year old male with recent diagnosis of obstructing kidney stone who presents to the emergency department with worsening pain.  The patient endorses suprapubic pain as well as left flank pain.  He was diagnosed with a 3 mm obstructing UVJ stone on the left yesterday and was discharged with a plan for outpatient management.  He denies any fevers or chills.  He endorses persistent pain and states his pain medication is not helping.  He also endorses constipation with his last bowel movement being on Saturday.  He is not sure if he is passing gas.  He tried mag citrate without relief.  On arrival, the patient was vitally stable, mildly hypertensive BP 150/86, not tachycardic or tachypneic, afebrile.  Physical exam significant for left lower quadrant tenderness to palpation, mild guarding present, left CVA tenderness.  Concern for continued persistent symptoms associated with the patient's nephrolithiasis.  Additionally considered small bowel obstruction as the patient states he is not passing gas and is now having generalized tenderness periumbilically.  Laboratory evaluation significant for leukocytosis to 16.8, worsening AKI with creatinine of 1.75 from baseline of around 1, lactic acidosis 2.0, urinalysis with many bacteria, negative nitrites and leukocytes, 6-10 WBCs.  8:17AM Spoke with Dr Cain Sieve, recommended ED to ED to [facility] for evaluation.   CT Abdomen Pelvis:  IMPRESSION:  1. Persistent obstruction at the left ureterovesical junction by a 3  mm calculus. Since the prior CT of  yesterday, the patient has  developed increased perinephric soft tissue stranding and fluid with  delayed contrast extravasation consistent with pyelosinus  extravasation. No significant urinoma at this time. Recommend  urology consultation.  2. Additional nonobstructing calculi in the lower pole of the left  kidney and within the bladder lumen.  3. Bilateral renal sinus cysts. Unchanged bilateral adrenal  adenomas. Distal colonic diverticulosis.  4.  Aortic Atherosclerosis (ICD10-I70.0).  5. New/enlarging sclerotic osseous lesions from 2020 as recently  discussed. Correlate with serum PSA levels.   Discussed care of the patient with Dr. Roderic Palau of [facility] who accepted the patient in ED to ED transfer.  EMTALA completed prior to transfer.  Patient and family updated bedside on plan of care with plan for urology  evaluation in the ED at [facility].  Given for negative leukocytosis and bacteriuria in the setting of suprapubic pain and known obstructing nephrolithiasis, the patient was administered an IV fluid bolus and IV Rocephin.   Final Clinical Impression(s) / ED Diagnoses Final diagnoses:  Nephrolithiasis  AKI (acute kidney injury) [facility])    Rx / [facility] Orders ED Discharge Orders     None         Regan Lemming, MD 02/02/22 [phone]

## 2022-02-02 NOTE — Anesthesia Postprocedure Evaluation (Signed)
Anesthesia Post Note  Patient: Charles Cameron  Procedure(s) Performed: CYSTOSCOPY WITH RETROGRADE PYELOGRAM, URETEROSCOPY AND STENT PLACEMENT (Left)     Patient location during evaluation: PACU Anesthesia Type: General Level of consciousness: awake and alert, oriented and patient cooperative Pain management: pain level controlled Vital Signs Assessment: post-procedure vital signs reviewed and stable Respiratory status: spontaneous breathing, nonlabored ventilation and respiratory function stable Cardiovascular status: blood pressure returned to baseline and stable Postop Assessment: no apparent nausea or vomiting Anesthetic complications: no   No notable events documented.  Last Vitals:  Vitals:   02/02/22 1340 02/02/22 1454  BP: 140/81 118/67  Pulse: 95 84  Resp: 16 18  Temp: (!) 36.4 C 37.1 C  SpO2: 97% 97%    Last Pain:  Vitals:   02/02/22 1454  TempSrc:   PainSc: [name]

## 2022-02-02 NOTE — H&P (Addendum)
H&P  Chief Complaint: ureteral stone, flank pain  History of Present Illness: Charles Cameron is a 76 y.o. year old M with an obstructing left ureteral stone.  The patient initially presented to the ED complaining of acute left flank pain.  A CT scan was done which demonstrated a 3 mm left stone with some associated hydroureteronephrosis.  There is also a nonobstructing stone in the left kidney and some stranding.  He returned to the ED at [facility] this morning with poorly controlled pain.  His creatinine had trended up as well.  Today he continues to endorse bothersome left flank pain.  He endorses some nausea but no vomiting.  Charles Cameron denies subjective fever symptoms.  He has never had a stone before.  Of note his CT scans from yesterday and today also mention a sclerotic lesion on his pelvis. Per care everywhere, his PSA was 0.83 in March. Charles Charles Cameron has a history of prostate cancer s/p RALP with salvage  XRT in 2018. He is on Elmiron q6 months  Patient's urinalysis is not suspicious for infection.    Past Medical History:  Diagnosis Date   Anticoagulation adequate 07/14/2018   Edema 07/14/2018   Hypertension    OSA (obstructive sleep apnea)    not tolerant of CPAP   Persistent atrial fibrillation ([place])    Prostate cancer ([name]) 2016   Skin Cancer    URI (upper respiratory infection) 07/14/2018    Past Surgical History:  Procedure Laterality Date   ATRIAL FIBRILLATION ABLATION N/A 04/30/2021   Procedure: ATRIAL FIBRILLATION ABLATION;  Surgeon: Thompson Grayer, MD;  Location: [place] [facility];  Service: Cardiovascular;  Laterality: N/A;   LUMBAR LAMINECTOMY  07/14/1987   PROSTATECTOMY      Home Medications:  No outpatient medications have been marked as taking for the 02/02/22 encounter [facility] Encounter).    Allergies: No Known Allergies  Family History  Problem Relation Age of Onset   Heart attack Mother    Hypertension Mother    Prostate cancer Father     Hypertension Father    Colon cancer Other    Diabetes Paternal Grandfather     Social History:  reports that he quit smoking about 38 years ago. His smoking use included cigarettes. He has a 54.00 pack-year smoking history. He has been exposed to tobacco smoke. He has never used smokeless tobacco. He reports current alcohol use of about 4.0 standard drinks of alcohol per week. He reports that he does not use drugs.  ROS: A complete review of systems was performed.  All systems are negative except for pertinent findings as noted.  Physical Exam:  Vital signs in last 24 hours: Temp:  [98.2 F (36.8 C)-98.7 F (37.1 C)] 98.7 F (37.1 C) (07/24 1012) Pulse Rate:  [82-95] 95 (07/24 1011) Resp:  [16-19] 17 (07/24 1000) BP: (135-150)/(79-91) 137/79 (07/24 1011) SpO2:  [93 %-96 %] 95 % (07/24 1011) Constitutional:  Alert and oriented, No acute distress Cardiovascular: Regular rate and rhythm, No JVD Respiratory: Normal respiratory effort, Lungs clear bilaterally GI: Abdomen is soft, nontender, nondistended, no abdominal masses GU: No CVA tenderness Lymphatic: No lymphadenopathy Neurologic: Grossly intact, no focal deficits Psychiatric: Normal mood and affect Drains/Tubes: none   Laboratory Data:  Recent Labs    02/01/22 0334 02/02/22 0730  WBC 9.4 16.8*  HGB 15.9 15.2  HCT 45.1 43.9  PLT 184 164    Recent Labs    02/01/22 0334 02/02/22 0730  NA 138 134*  K 3.5 4.1  CL 106 101  GLUCOSE 165* 169*  BUN 24* 22  CALCIUM 9.1 8.8*  CREATININE 1.30* 1.75*     Results for orders placed or performed during the hospital encounter of 02/02/22 (from the past 24 hour(s))  CBC with Differential     Status: Abnormal   Collection Time: 02/02/22  7:30 AM  Result Value Ref Range   WBC 16.8 (H) 4.0 - 10.5 K/uL   RBC 4.72 4.22 - 5.81 MIL/uL   Hemoglobin 15.2 13.0 - 17.0 g/dL   HCT 43.9 39.0 - 52.0 %   MCV 93.0 80.0 - 100.0 fL   MCH 32.2 26.0 - 34.0 pg   MCHC 34.6 30.0 - 36.0 g/dL    RDW 13.1 11.5 - 15.5 %   Platelets 164 150 - 400 K/uL   nRBC 0.0 0.0 - 0.2 %   Neutrophils Relative % 87 %   Neutro Abs 14.6 (H) 1.7 - 7.7 K/uL   Lymphocytes Relative 6 %   Lymphs Abs 0.9 0.7 - 4.0 K/uL   Monocytes Relative 7 %   Monocytes Absolute 1.1 (H) 0.1 - 1.0 K/uL   Eosinophils Relative 0 %   Eosinophils Absolute 0.0 0.0 - 0.5 K/uL   Basophils Relative 0 %   Basophils Absolute 0.0 0.0 - 0.1 K/uL   Immature Granulocytes 0 %   Abs Immature Granulocytes 0.06 0.00 - 0.07 K/uL  Basic metabolic panel     Status: Abnormal   Collection Time: 02/02/22  7:30 AM  Result Value Ref Range   Sodium 134 (L) 135 - 145 mmol/L   Potassium 4.1 3.5 - 5.1 mmol/L   Chloride 101 98 - 111 mmol/L   CO2 23 22 - 32 mmol/L   Glucose, Bld 169 (H) 70 - 99 mg/dL   BUN 22 8 - 23 mg/dL   Creatinine, Ser 1.75 (H) 0.61 - 1.24 mg/dL   Calcium 8.8 (L) 8.9 - 10.3 mg/dL   GFR, Estimated 40 (L) >60 mL/min   Anion gap 10 5 - 15  Lactic acid, plasma     Status: Abnormal   Collection Time: 02/02/22  7:55 AM  Result Value Ref Range   Lactic Acid, Venous 2.0 (HH) 0.5 - 1.9 mmol/L  Urinalysis, Routine w reflex microscopic Urine, Clean Catch     Status: Abnormal   Collection Time: 02/02/22  8:24 AM  Result Value Ref Range   Color, Urine AMBER (A) YELLOW   APPearance CLOUDY (A) CLEAR   Specific Gravity, Urine 1.020 1.005 - 1.030   pH 6.5 5.0 - 8.0   Glucose, UA NEGATIVE NEGATIVE mg/dL   Hgb urine dipstick LARGE (A) NEGATIVE   Bilirubin Urine NEGATIVE NEGATIVE   Ketones, ur 15 (A) NEGATIVE mg/dL   Protein, ur 30 (A) NEGATIVE mg/dL   Nitrite NEGATIVE NEGATIVE   Leukocytes,Ua NEGATIVE NEGATIVE  Urinalysis, Microscopic (reflex)     Status: Abnormal   Collection Time: 02/02/22  8:24 AM  Result Value Ref Range   RBC / HPF >50 0 - 5 RBC/hpf   WBC, UA 6-10 0 - 5 WBC/hpf   Bacteria, UA MANY (A) NONE SEEN   Squamous Epithelial / LPF 0-5 0 - 5   No results found for this or any previous visit (from the past 240  hour(s)).  Renal Function: Recent Labs    02/01/22 0334 02/02/22 0730  CREATININE 1.30* 1.75*   Estimated Creatinine Clearance: 42.3 mL/min (A) (by C-G formula based on SCr of 1.75 mg/dL (  H)).  Radiologic Imaging: CT ABDOMEN PELVIS W CONTRAST  Result Date: 02/02/2022 CLINICAL DATA:  Flank pain, kidney stone suspected. Nausea and vomiting. Left flank and left lower quadrant abdominal pain for 2 days. EXAM: CT ABDOMEN AND PELVIS WITH CONTRAST TECHNIQUE: Multidetector CT imaging of the abdomen and pelvis was performed using the standard protocol following bolus administration of intravenous contrast. RADIATION DOSE REDUCTION: This exam was performed according to the departmental dose-optimization program which includes automated exposure control, adjustment of the mA and/or kV according to patient size and/or use of iterative reconstruction technique. CONTRAST:  33m OMNIPAQUE IOHEXOL 300 MG/ML  SOLN COMPARISON:  Noncontrast abdominopelvic CT 02/01/2022. CT abdomen and pelvis with contrast 10/30/2018. FINDINGS: Lower chest: New subsegmental atelectasis at both lung bases. There is no confluent airspace opacity, pleural or pericardial effusion. Aortic atherosclerosis and aortic valvular calcifications are noted. Hepatobiliary: The liver is normal in density without suspicious focal abnormality. No evidence of gallstones, gallbladder wall thickening or biliary dilatation. Pancreas: Unremarkable. No pancreatic ductal dilatation or surrounding inflammatory changes. Spleen: Normal in size without focal abnormality. Adrenals/Urinary Tract: Bilateral adrenal nodularity is unchanged from 2020, attributed to adenomas based on stability. There is a nonobstructing calculus in the lower pole of the left kidney which measures up to 10 mm on coronal image 60/5. 3 mm obstructing calculus at the left ureterovesical junction has not significantly changed. There is an adjacent small bladder calculus. There is persistent  left-sided hydronephrosis and hydroureter with delayed contrast excretion and increased perinephric soft tissue stranding and fluid. The delayed post-contrast images demonstrate extravasation of contrast into the anterior and medial left perinephric space consistent with pyelosinus extravasation. Renal sinus cysts are present bilaterally, without evidence of right ureteral obstruction. Mild bladder wall thickening. Stomach/Bowel: No enteric contrast was administered. There is a small hiatal hernia. The stomach otherwise appears unremarkable for its degree of distention. No evidence of bowel distension, wall thickening or surrounding inflammation. The appendix appears normal. There are diverticular changes of the descending and sigmoid colon. Vascular/Lymphatic: There are no enlarged abdominal or pelvic lymph nodes. Aortic and branch vessel atherosclerosis without evidence of aneurysm. Reproductive: Previous prostatectomy. Other: No evidence of abdominal wall mass or hernia. No ascites. Musculoskeletal: Scattered small sclerotic lesions are again noted within the bones. There are no acute osseous findings. There is multilevel spondylosis with ankylosis across the L5-S1 disc space. IMPRESSION: 1. Persistent obstruction at the left ureterovesical junction by a 3 mm calculus. Since the prior CT of yesterday, the patient has developed increased perinephric soft tissue stranding and fluid with delayed contrast extravasation consistent with pyelosinus extravasation. No significant urinoma at this time. Recommend urology consultation. 2. Additional nonobstructing calculi in the lower pole of the left kidney and within the bladder lumen. 3. 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 Distal colonic diverticulosis. 4.  Aortic Atherosclerosis (ICD10-I70.0). 5. New/enlarging sclerotic osseous lesions from 2020 as recently discussed. Correlate with serum PSA levels. Electronically Signed   By: WRichardean SaleM.D.   On: 02/02/2022 08:48   CT Renal Stone Study  Result Date: 02/01/2022 CLINICAL DATA:  Flank pain with kidney stone suspected.  Hematuria EXAM: CT ABDOMEN AND PELVIS WITHOUT CONTRAST TECHNIQUE: Multidetector CT imaging of the abdomen and pelvis was performed following the standard protocol without IV contrast. RADIATION DOSE REDUCTION: This exam was performed according to the departmental dose-optimization program which includes automated exposure control, adjustment of the mA and/or kV according to patient size and/or use of iterative reconstruction technique. COMPARISON:  10/30/2018 FINDINGS: Lower chest:  No contributory findings. Hepatobiliary: No focal liver abnormality.No evidence of biliary obstruction or stone. Pancreas: Unremarkable. Spleen: Unremarkable. Adrenals/Urinary Tract: Nodular adrenal glands, the largest in the lateral limb on the left measuring 17 mm. The largest on the right measures 11 mm. These are stable from 2020 and attributed to adenoma. Left perinephric stranding and hydroureteronephrosis due to a UVJ calculus which measures 3 mm. A similar size stone is layering in the urinary bladder. Left renal calculus at the lower pole measuring 8 mm. Renal sinus cysts. Unremarkable bladder. Stomach/Bowel:  No obstruction. Colonic diverticulosis. Vascular/Lymphatic: Atheromatous calcification of the aorta and iliacs. No mass or adenopathy. Reproductive:Prostatectomy. Other: No ascites or pneumoperitoneum. Musculoskeletal: Generalized lumbar spine degeneration. Bilateral hip osteoarthritis. 11 mm sclerotic area in the anterior inferior corner of the L1 vertebra, larger than in 2020. New even smaller rounded sclerotic focus in the lower half of the L1 vertebra. Small sclerotic focus in the right transverse process of T12, new. 1 cm sclerotic focus in the left sacral ala, larger than in 2020. IMPRESSION: 1. Obstructing 3 mm stone at the left UVJ. A similar size calculus is also present  in the urinary bladder. 2. Few small sclerotic foci in the skeleton, larger/new since 2020, please correlate with PSA in this patient with history of prostate cancer. 3. Left nephrolithiasis. Electronically Signed   By: Jorje Guild M.D.   On: 02/01/2022 05:24    Assessment:  76 yo M with an obstructing left ureteral stone, intractable pain  Plan:  To OR. Given location of stone I will attempt to remove via ureteroscopy and place a stent. We reviewed this operation in detail including risks (Hematuria, infection, sepsis, need for future procedures, damage to ureter, prolonged indwelling stent placement). We also discussed I would not attempt to reach the non obstructing stone in the kidney given the emergent nature of the case.  All questions answered  Donald Pore, MD 02/02/2022, 11:15 AM  [facility] Pager: [phone]

## 2022-02-02 NOTE — ED Provider Notes (Signed)
11:12 AM Patient arrived from [facility]. I discussed with urology, Dr. Cain Sieve. Will likely take to OR for stone removal. Keep NPO.  12:09 PM Urology will take to [place], MD 02/02/22 1209

## 2022-02-02 NOTE — Transfer of Care (Signed)
Immediate Anesthesia Transfer of Care Note  Patient: Charles Cameron  Procedure(s) Performed: CYSTOSCOPY WITH RETROGRADE PYELOGRAM, URETEROSCOPY AND STENT PLACEMENT (Left)  Patient Location: PACU  Anesthesia Type:General  Level of Consciousness: drowsy  Airway & Oxygen Therapy: Patient Spontanous Breathing and Patient connected to face mask oxygen  Post-op Assessment: Report given to RN, Post -op Vital signs reviewed and stable and Patient moving all extremities X 4  Post vital signs: Reviewed and stable  Last Vitals:  Vitals Value Taken Time  BP 118/67   Temp    Pulse 85   Resp 12   SpO2 97     Last Pain:  Vitals:   02/02/22 1344  TempSrc:   PainSc: 2          Complications: No notable events documented.

## 2022-02-02 NOTE — Anesthesia Procedure Notes (Signed)
Procedure Name: LMA Insertion Date/Time: 02/02/2022 2:15 PM  Performed by: West Pugh, CRNAPre-anesthesia Checklist: Patient identified, Emergency Drugs available, Suction available, Patient being monitored and Timeout performed Patient Re-evaluated:Patient Re-evaluated prior to induction Oxygen Delivery Method: Circle system utilized Preoxygenation: Pre-oxygenation with 100% oxygen Induction Type: IV induction Ventilation: Mask ventilation without difficulty LMA: LMA with gastric port inserted LMA Size: 4.0 Number of attempts: 2 Placement Confirmation: positive ETCO2 Tube secured with: Tape Dental Injury: Teeth and Oropharynx as per pre-operative assessment

## 2022-02-02 NOTE — Anesthesia Preprocedure Evaluation (Addendum)
Anesthesia Evaluation  Patient identified by MRN, date of birth, ID band Patient awake    Reviewed: Allergy & Precautions, NPO status , Patient's Chart, lab work & pertinent test results  History of Anesthesia Complications Negative for: history of anesthetic complications  Airway Mallampati: I  TM Distance: >3 FB Neck ROM: Full    Dental  (+) Teeth Intact, Dental Advisory Given   Pulmonary neg shortness of breath, sleep apnea , neg COPD, neg recent URI, former smoker,  Quit smoking 1985, 62 pack year history    breath sounds clear to auscultation       Cardiovascular hypertension, Pt. on medications (-) angina(-) Past MI and (-) CHF + dysrhythmias Atrial Fibrillation  Rhythm:Regular  1. Left ventricular ejection fraction, by estimation, is 70 to 75%. The  left ventricle has hyperdynamic function. The left ventricle has no  regional wall motion abnormalities. There is mild left ventricular  hypertrophy. Left ventricular diastolic  function could not be evaluated.  2. Right ventricular systolic function is mildly reduced. The right  ventricular size is mildly enlarged. The estimated right ventricular  systolic pressure is 74.0 mmHg.  3. Left atrial size was severely dilated.  4. Right atrial size was mildly dilated.  5. The mitral valve is grossly normal. Mild mitral valve regurgitation.  6. The aortic valve is tricuspid. Aortic valve regurgitation is mild.  Mild to moderate aortic valve sclerosis/calcification is present, without  any evidence of aortic stenosis.  7. Aortic dilatation noted. There is borderline dilatation of the aortic  root, measuring 39 mm. There is mild dilatation of the ascending aorta,  measuring 41 mm.  8. The inferior vena cava is normal in size with greater than 50%  respiratory variability, suggesting right atrial pressure of 3 mmHg.    Neuro/Psych negative neurological ROS  negative psych  ROS   GI/Hepatic negative GI ROS, Neg liver ROS,   Endo/Other  negative endocrine ROS  Renal/GU Renal InsufficiencyRenal diseaseCr 1.75     Musculoskeletal negative musculoskeletal ROS (+)   Abdominal   Peds  Hematology  (+) Blood dyscrasia, , Hb 15.2, plt 164  xarelto   Anesthesia Other Findings   Reproductive/Obstetrics                            Anesthesia Physical Anesthesia Plan  ASA: 2  Anesthesia Plan: General   Post-op Pain Management: Minimal or no pain anticipated   Induction: Intravenous  PONV Risk Score and Plan: 2 and Ondansetron and Dexamethasone  Airway Management Planned: LMA  Additional Equipment: None  Intra-op Plan:   Post-operative Plan: Extubation in OR  Informed Consent: I have reviewed the patients History and Physical, chart, labs and discussed the procedure including the risks, benefits and alternatives for the proposed anesthesia with the patient or authorized representative who has indicated his/her understanding and acceptance.     Dental advisory given  Plan Discussed with: CRNA  Anesthesia Plan Comments:         Anesthesia Quick Evaluation

## 2022-02-03 ENCOUNTER — Encounter (HOSPITAL_COMMUNITY): Payer: Self-pay | Admitting: Urology

## 2022-02-03 LAB — URINE CULTURE: Culture: NO GROWTH

## 2022-02-04 ENCOUNTER — Other Ambulatory Visit: Payer: Self-pay

## 2022-02-04 ENCOUNTER — Encounter (HOSPITAL_COMMUNITY): Payer: Self-pay

## 2022-02-04 ENCOUNTER — Emergency Department (HOSPITAL_COMMUNITY)
Admission: EM | Admit: 2022-02-04 | Discharge: 2022-02-04 | Disposition: A | Discharge status: Home / Self Care | Payer: Medicare Other | Attending: Emergency Medicine | Admitting: Emergency Medicine

## 2022-02-04 DIAGNOSIS — Z79899 Other long term (current) drug therapy: Secondary | ICD-10-CM | POA: Insufficient documentation

## 2022-02-04 DIAGNOSIS — Z7901 Long term (current) use of anticoagulants: Secondary | ICD-10-CM | POA: Insufficient documentation

## 2022-02-04 DIAGNOSIS — Z85828 Personal history of other malignant neoplasm of skin: Secondary | ICD-10-CM | POA: Diagnosis not present

## 2022-02-04 DIAGNOSIS — Z8546 Personal history of malignant neoplasm of prostate: Secondary | ICD-10-CM | POA: Insufficient documentation

## 2022-02-04 DIAGNOSIS — R339 Retention of urine, unspecified: Secondary | ICD-10-CM | POA: Diagnosis present

## 2022-02-04 DIAGNOSIS — I10 Essential (primary) hypertension: Secondary | ICD-10-CM | POA: Diagnosis not present

## 2022-02-04 LAB — CBC WITH DIFFERENTIAL/PLATELET
Abs Immature Granulocytes: 0.01 10*3/uL (ref 0.00–0.07)
Basophils Absolute: 0 10*3/uL (ref 0.0–0.1)
Basophils Relative: 0 %
Eosinophils Absolute: 0.1 10*3/uL (ref 0.0–0.5)
Eosinophils Relative: 2 %
HCT: 41 % (ref 39.0–52.0)
Hemoglobin: 14.1 g/dL (ref 13.0–17.0)
Immature Granulocytes: 0 %
Lymphocytes Relative: 19 %
Lymphs Abs: 1.3 10*3/uL (ref 0.7–4.0)
MCH: 32.7 pg (ref 26.0–34.0)
MCHC: 34.4 g/dL (ref 30.0–36.0)
MCV: 95.1 fL (ref 80.0–100.0)
Monocytes Absolute: 0.8 10*3/uL (ref 0.1–1.0)
Monocytes Relative: 11 %
Neutro Abs: 4.8 10*3/uL (ref 1.7–7.7)
Neutrophils Relative %: 68 %
Platelets: 152 10*3/uL (ref 150–400)
RBC: 4.31 MIL/uL (ref 4.22–5.81)
RDW: 13.3 % (ref 11.5–15.5)
WBC: 7.1 10*3/uL (ref 4.0–10.5)
nRBC: 0 % (ref 0.0–0.2)

## 2022-02-04 LAB — BASIC METABOLIC PANEL
Anion gap: 10 (ref 5–15)
BUN: 19 mg/dL (ref 8–23)
CO2: 22 mmol/L (ref 22–32)
Calcium: 9 mg/dL (ref 8.9–10.3)
Chloride: 108 mmol/L (ref 98–111)
Creatinine, Ser: 1.19 mg/dL (ref 0.61–1.24)
GFR, Estimated: >60 mL/min (ref >60)
Glucose, Bld: 125 mg/dL — ABNORMAL HIGH (ref 70–99)
Potassium: 3.7 mmol/L (ref 3.5–5.1)
Sodium: 140 mmol/L (ref 135–145)

## 2022-02-04 MED ORDER — HYDROCODONE-ACETAMINOPHEN 5-325 MG PO TABS
2.0000 | ORAL_TABLET | Freq: Once | ORAL | Status: AC
  Administered 2022-02-04: 2 via ORAL
  Filled 2022-02-04: qty 2

## 2022-02-04 NOTE — ED Triage Notes (Addendum)
Pt reports with urinary retention x 2 days. Pt states that he has been urinating less and less. Pt reports recently having some kidney stones removed. Pt reports having bleeding and clots causing a blockage.

## 2022-02-04 NOTE — ED Provider Notes (Signed)
[facility] DEPT Provider Note   CSN: [id number] Arrival date & time: 02/04/22  0542     History  Chief Complaint  Patient presents with   Urinary Retention    Charles Cameron is a 76 y.o. male.  76 year old male presents today for evaluation of urinary retention following stent placement for kidney stone on 7/24.  He states he has had progressively decreasing amounts of void along with blood clots.  He is unsure of the last decent void he had.  He states he had dribbled a small volume sometime last night.  Patient appears uncomfortable and reports significant pain.  States he was told to pull out the stent tomorrow on 7/27. Patient's last dose of Xarelto was yesterday morning.  He states he missed 1 dose the day of the procedure given the unplanned procedure.  The history is provided by the patient. No language interpreter was used.       Home Medications Prior to Admission medications   Medication Sig Start Date End Date Taking? Authorizing Provider  amLODipine (NORVASC) 5 MG tablet Take 5 mg by mouth every morning. 12/02/21   [provider]  Cyanocobalamin (VITAMIN B-12) 5000 MCG SUBL Place 5,000 mcg under the tongue every morning.    [provider]  diltiazem (CARDIZEM CD) 180 MG 24 hr capsule Take 1 capsule (180 mg total) by mouth daily. Discontinue after 6 weeks Patient not taking: Reported on 02/01/2022 08/29/21 10/10/21  Thompson Grayer, MD  ezetimibe (ZETIA) 10 MG tablet Take 10 mg by mouth daily with supper. 08/05/21   [provider]  HYDROcodone-acetaminophen (NORCO/VICODIN) 5-325 MG tablet Take 1 tablet by mouth every 6 (six) hours as needed. 02/02/22   Vira Agar, MD  ibuprofen (ADVIL) 200 MG tablet Take 600 mg by mouth every 6 (six) hours as needed for moderate pain.    [provider]  levocetirizine (XYZAL) 5 MG tablet Take 5 mg by mouth in the morning.    [provider]  losartan (COZAAR)  50 MG tablet Take 1 tablet (50 mg total) by mouth daily. Patient taking differently: Take 100 mg by mouth every morning. 06/30/21 06/25/22  Loel Dubonnet, NP  meloxicam (MOBIC) 15 MG tablet Take 1 tablet (15 mg total) by mouth daily as needed for up to 28 days for pain. 02/02/22 03/02/22  Vira Agar, MD  Misc Natural Products (GLUCOSAMINE CHONDROITIN TRIPLE) TABS Take 2 tablets by mouth every morning.    [provider]  Multiple Vitamin (MULTIVITAMIN WITH MINERALS) TABS tablet Take 1 tablet by mouth every morning.    [provider]  nitrofurantoin, macrocrystal-monohydrate, (MACROBID) 100 MG capsule Take 1 capsule (100 mg total) by mouth 2 (two) times daily for 3 days. 02/02/22 02/05/22  Vira Agar, MD  Omega-3 Fatty Acids (FISH OIL) 1000 MG CAPS Take 1,000 mg by mouth every morning.    [provider]  Prednisol Ace-Moxiflox-Bromfen 1-0.5-0.075 % SUSP Place 1 drop into the left eye See admin instructions. Start date 01/17/22: instill one drop into left eye four times daily for one week, then three times daily for one week, then twice daily for one week, then once daily until bottle is empty    [provider]  rivaroxaban (XARELTO) 20 MG TABS tablet Take 1 tablet (20 mg total) by mouth daily with supper. Patient taking differently: Take 20 mg by mouth every morning. 09/01/21   Thompson Grayer, MD  tamsulosin (FLOMAX) 0.4 MG CAPS  capsule Take 1 capsule (0.4 mg total) by mouth daily after supper. 02/02/22   Vira Agar, MD      Allergies    Patient has no known allergies.    Review of Systems   Review of Systems  Constitutional:  Negative for chills and fever.  Gastrointestinal:  Positive for abdominal pain. Negative for nausea and vomiting.  Genitourinary:  Positive for decreased urine volume and difficulty urinating. Negative for flank pain.  All other systems reviewed and are negative.   Physical Exam Updated Vital Signs BP (!) 151/82 (BP  Location: Left Arm)   Pulse 84   Temp 98.7 F (37.1 C) (Oral)   Resp 17   Ht '5\' 10"'$  (1.778 m)   Wt 102.1 kg   SpO2 95%   BMI 32.28 kg/m  Physical Exam Vitals and nursing note reviewed.  Constitutional:      General: He is not in acute distress.    Appearance: Normal appearance. He is not ill-appearing.  HENT:     Head: Normocephalic and atraumatic.     Nose: Nose normal.  Eyes:     Conjunctiva/sclera: Conjunctivae normal.  Cardiovascular:     Rate and Rhythm: Normal rate and regular rhythm.  Pulmonary:     Effort: Pulmonary effort is normal. No respiratory distress.     Breath sounds: Normal breath sounds. No wheezing or rales.  Abdominal:     Tenderness: There is abdominal tenderness (suprapubic). There is no right CVA tenderness or left CVA tenderness.  Musculoskeletal:        General: No deformity.  Skin:    Findings: No rash.  Neurological:     Mental Status: He is alert.     ED Results / Procedures / Treatments   Labs (all labs ordered are listed, but only abnormal results are displayed) Labs Reviewed  CBC WITH DIFFERENTIAL/PLATELET  BASIC METABOLIC PANEL    EKG None  Radiology DG C-Arm 1-60 Min-No Report  Result Date: 02/02/2022 Fluoroscopy was utilized by the requesting physician.  No radiographic interpretation.   CT ABDOMEN PELVIS W CONTRAST  Result Date: 02/02/2022 CLINICAL DATA:  Flank pain, kidney stone suspected. Nausea and vomiting. Left flank and left lower quadrant abdominal pain for 2 days. EXAM: CT ABDOMEN AND PELVIS WITH CONTRAST TECHNIQUE: Multidetector CT imaging of the abdomen and pelvis was performed using the standard protocol following bolus administration of intravenous contrast. RADIATION DOSE REDUCTION: This exam was performed according to the departmental dose-optimization program which includes automated exposure control, adjustment of the mA and/or kV according to patient size and/or use of iterative reconstruction technique.  CONTRAST:  33m OMNIPAQUE IOHEXOL 300 MG/ML  SOLN COMPARISON:  Noncontrast abdominopelvic CT 02/01/2022. CT abdomen and pelvis with contrast 10/30/2018. FINDINGS: Lower chest: New subsegmental atelectasis at both lung bases. There is no confluent airspace opacity, pleural or pericardial effusion. Aortic atherosclerosis and aortic valvular calcifications are noted. Hepatobiliary: The liver is normal in density without suspicious focal abnormality. No evidence of gallstones, gallbladder wall thickening or biliary dilatation. Pancreas: Unremarkable. No pancreatic ductal dilatation or surrounding inflammatory changes. Spleen: Normal in size without focal abnormality. Adrenals/Urinary Tract: Bilateral adrenal nodularity is unchanged from 2020, attributed to adenomas based on stability. There is a nonobstructing calculus in the lower pole of the left kidney which measures up to 10 mm on coronal image 60/5. 3 mm obstructing calculus at the left ureterovesical junction has not significantly changed. There is an adjacent small bladder calculus. There is persistent left-sided hydronephrosis and  hydroureter with delayed contrast excretion and increased perinephric soft tissue stranding and fluid. The delayed post-contrast images demonstrate extravasation of contrast into the anterior and medial left perinephric space consistent with pyelosinus extravasation. Renal sinus cysts are present bilaterally, without evidence of right ureteral obstruction. Mild bladder wall thickening. Stomach/Bowel: No enteric contrast was administered. There is a small hiatal hernia. The stomach otherwise appears unremarkable for its degree of distention. No evidence of bowel distension, wall thickening or surrounding inflammation. The appendix appears normal. There are diverticular changes of the descending and sigmoid colon. Vascular/Lymphatic: There are no enlarged abdominal or pelvic lymph nodes. Aortic and branch vessel atherosclerosis without  evidence of aneurysm. Reproductive: Previous prostatectomy. Other: No evidence of abdominal wall mass or hernia. No ascites. Musculoskeletal: Scattered small sclerotic lesions are again noted within the bones. There are no acute osseous findings. There is multilevel spondylosis with ankylosis across the L5-S1 disc space. IMPRESSION: 1. Persistent obstruction at the left ureterovesical junction by a 3 mm calculus. Since the prior CT of yesterday, the patient has developed increased perinephric soft tissue stranding and fluid with delayed contrast extravasation consistent with pyelosinus extravasation. No significant urinoma at this time. Recommend urology consultation. 2. Additional nonobstructing calculi in the lower pole of the left kidney and within the bladder lumen. 3. 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 Distal colonic diverticulosis. 4.  Aortic Atherosclerosis (ICD10-I70.0). 5. New/enlarging sclerotic osseous lesions from 2020 as recently discussed. Correlate with serum PSA levels. Electronically Signed   By: Richardean Sale M.D.   On: 02/02/2022 08:48    Procedures Procedures    Medications Ordered in ED Medications  HYDROcodone-acetaminophen (NORCO/VICODIN) 5-325 MG per tablet 2 tablet (has no administration in time range)    ED Course/ Medical Decision Making/ A&P                           Medical Decision Making Amount and/or Complexity of Data Reviewed Labs: ordered.  Risk Prescription drug management.   Medical Decision Making / ED Course   This patient presents to the ED for concern of urinary retention, this involves an extensive number of treatment options, and is a complaint that carries with it a high risk of complications and morbidity.  The differential diagnosis includes urinary retention  MDM: 76 year old male presents today for evaluation of urinary retention for over the past 2 days.  Patient had ureter stent placed 7/24.  Also notes  blood clots.  AKI prior to stent placement.  We will repeat labs.  Bladder scan showed 400 mL.  Repeat ultrasound was done at bedside with Dr. Ralene Bathe which again demonstrated about 300 mL.  Patient uncomfortable on exam.  Will provide pain relief.  Discussed with urology.  He recommends that it is safe to proceed with catheter placement and outpatient follow-up.  On repeat BMP AKI has resolved.  Without leukocytosis.  Patient without fever, or tachycardia. Nursing staff had difficulty with placing Foley catheter.  Three-way Foley catheter was placed.  Irrigation performed until Foley bag had light pink urine.  No active hematuria noted.  Will stop further irrigation.  Will place patient in leg bag.  Patient is stable for discharge.  Discharged in stable condition.  Return precautions discussed.  Discussed follow-up with urologist.  Dr. Kathrynn Humble recommends patient's stop his anticoagulation for the next 2 days and then resume as normal afterwards.  This was communicated with the patient.  Patient voices understanding and is in  agreement with plan.   Lab Tests: -I ordered, reviewed, and interpreted labs.   The pertinent results include:   Labs Reviewed  BASIC METABOLIC PANEL - Abnormal; Notable for the following components:      Result Value   Glucose, Bld 125 (*)    All other components within normal limits  CBC WITH DIFFERENTIAL/PLATELET      EKG  EKG Interpretation  Date/Time:    Ventricular Rate:    PR Interval:    QRS Duration:   QT Interval:    QTC Calculation:   R Axis:     Text Interpretation:           Medicines ordered and prescription drug management: Meds ordered this encounter  Medications   HYDROcodone-acetaminophen (NORCO/VICODIN) 5-325 MG per tablet 2 tablet    -I have reviewed the patients home medicines and have made adjustments as needed  Reevaluation: After the interventions noted above, I reevaluated the patient and found that they have :improved  Co  morbidities that complicate the patient evaluation  Past Medical History:  Diagnosis Date   Anticoagulation adequate 07/14/2018   Edema 07/14/2018   Hypertension    OSA (obstructive sleep apnea)    not tolerant of CPAP   Persistent atrial fibrillation ([place])    Prostate cancer ([place]) 2016   Skin Cancer    URI (upper respiratory infection) 07/14/2018      Dispostion: Patient discharged in stable condition.  Return precautions discussed.  Final Clinical Impression(s) / ED Diagnoses Final diagnoses:  Urinary retention    Rx / DC Orders ED Discharge Orders     None         Evlyn Courier, PA-C 02/04/22 1132    Quintella Reichert, MD 02/04/22 2256

## 2022-02-04 NOTE — Discharge Instructions (Addendum)
Your work-up today was overall reassuring.  You had a catheter put in with continuous irrigation until you had clear urine.  You are given a leg bag on discharge.  As discussed by Dr. Kathrynn Humble please stop your Xarelto for the next 2 days and then resume as normal afterwards.  Please follow-up with urologist.  If you have worsening symptoms please return to the emergency room.

## 2022-03-30 ENCOUNTER — Other Ambulatory Visit: Payer: Self-pay | Admitting: Internal Medicine

## 2022-03-30 ENCOUNTER — Encounter (HOSPITAL_BASED_OUTPATIENT_CLINIC_OR_DEPARTMENT_OTHER): Payer: Self-pay | Admitting: Cardiology

## 2022-03-30 ENCOUNTER — Ambulatory Visit (INDEPENDENT_AMBULATORY_CARE_PROVIDER_SITE_OTHER): Payer: Medicare Other | Admitting: Cardiology

## 2022-03-30 VITALS — BP 122/68 | HR 79 | Ht 70.0 in | Wt 231.0 lb

## 2022-03-30 DIAGNOSIS — Z8679 Personal history of other diseases of the circulatory system: Secondary | ICD-10-CM

## 2022-03-30 DIAGNOSIS — Z9189 Other specified personal risk factors, not elsewhere classified: Secondary | ICD-10-CM

## 2022-03-30 DIAGNOSIS — I480 Paroxysmal atrial fibrillation: Secondary | ICD-10-CM

## 2022-03-30 DIAGNOSIS — Z7901 Long term (current) use of anticoagulants: Secondary | ICD-10-CM

## 2022-03-30 DIAGNOSIS — D6869 Other thrombophilia: Secondary | ICD-10-CM | POA: Diagnosis not present

## 2022-03-30 DIAGNOSIS — I10 Essential (primary) hypertension: Secondary | ICD-10-CM | POA: Diagnosis not present

## 2022-03-30 NOTE — Progress Notes (Signed)
Cardiology Office Note:    Date:  03/30/2022   ID:  Charles Cameron, DOB [birth year], MRN [id number]  PCP:  Patrecia Pour, Christean Grief, MD  Cardiologist:  Buford Dresser, MD PhD  Referring MD: Patrecia Pour, Christean Grief, MD   CC: follow up  History of Present Illness:    Charles SAEPHAN is a 76 y.o. male with a hx of pneumonia 06/2018-07/2018 with atrial flutter with paroxysmal atrial fibrillation diagnosed at the same time. He is seen for follow up today.  Cardiac history: No prior history until 06/2018, when he developed URI and atrial flutter/atrial fibrillation. He had intermittent RVR while in the hospital, requiring multiple medications for management. He was initially on dronedarone (stopped after 30 days), diltiazem, and metoprolol. Had volume overload with arrhythmia, improved with lasix. EF was normal. Afib has recurred, underwent successful ablation 04/2021.  Today:  He says that cardiology-wise he is doing well, but urology-wise he is not doing so well. He states this past Monday he had a kidney stone removal, as well as one last month as well. He required a ureteral stent and then a temporary catheter. 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  He reports excessive urinary bleeding earlier in the week, but the bleeding has been receding. He has been seeing a couple of blood clots as well.  Before his last two urological surgeries, he was in the gym doing weight training 5-6 days a week. He was losing weight significantly, which he has since added back due to his health complications.   He denies any palpitations, chest pain, shortness of breath, or peripheral edema. No lightheadedness, headaches, syncope, orthopnea, or PND.  Past Medical History:  Diagnosis Date   Anticoagulation adequate 07/14/2018   Edema 07/14/2018   Hypertension    OSA (obstructive sleep apnea)    not tolerant of CPAP   Persistent  atrial fibrillation ([place])    Prostate cancer ([place]) 2016   Skin Cancer    URI (upper respiratory infection) 07/14/2018    Past Surgical History:  Procedure Laterality Date   ATRIAL FIBRILLATION ABLATION N/A 04/30/2021   Procedure: ATRIAL FIBRILLATION ABLATION;  Surgeon: Thompson Grayer, MD;  Location: [facility];  Service: Cardiovascular;  Laterality: N/A;   CYSTOSCOPY WITH RETROGRADE PYELOGRAM, URETEROSCOPY AND STENT PLACEMENT Left 02/02/2022   Procedure: CYSTOSCOPY WITH RETROGRADE PYELOGRAM, URETEROSCOPY AND STENT PLACEMENT;  Surgeon: Vira Agar, MD;  Location: [facility];  Service: Urology;  Laterality: Left;  1.  Left ureteroscopy with laser lithotripsy and basket extraction of stones 2. Cystoscopy  3. Left retrograde pyelogram 4. Left ureteral stent placement (6x26 cm) 5. Fluoroscopy with intraoperative interpretation    LUMBAR LAMINECTOMY  07/14/1987   PROSTATECTOMY      Current Medications: Current Outpatient Medications on File Prior to Visit  Medication Sig   amLODipine (NORVASC) 5 MG tablet Take 5 mg by mouth every morning.   Cyanocobalamin (VITAMIN B-12) 5000 MCG SUBL Place 5,000 mcg under the tongue every morning.   ezetimibe (ZETIA) 10 MG tablet Take 10 mg by mouth daily with supper.   ibuprofen (ADVIL) 200 MG tablet Take 600 mg by mouth every 6 (six) hours as needed for moderate pain.   levocetirizine (XYZAL) 5 MG tablet Take 5 mg by mouth in the morning.   losartan (COZAAR) 100 MG tablet Take 100 mg by mouth daily.   Misc Natural Products (GLUCOSAMINE CHONDROITIN TRIPLE) TABS Take 2  tablets by mouth every morning.   Multiple Vitamin (MULTIVITAMIN WITH MINERALS) TABS tablet Take 1 tablet by mouth every morning.   Omega-3 Fatty Acids (FISH OIL) 1000 MG CAPS Take 1,000 mg by mouth every morning.   rivaroxaban (XARELTO) 20 MG TABS tablet Take 1 tablet (20 mg total) by mouth daily with supper. (Patient taking differently: Take 20 mg by mouth every morning.)   No  current facility-administered medications on file prior to visit.     Allergies:   Patient has no known allergies.   Social History   Tobacco Use   Smoking status: Former    Packs/day: 2.00    Years: 27.00    Total pack years: 54.00    Types: Cigarettes    Quit date: 07/14/1983    Years since quitting: 38.7    Passive exposure: Past   Smokeless tobacco: Never   Tobacco comments:    Former smoker 05/28/2021  Vaping Use   Vaping Use: Never used  Substance Use Topics   Alcohol use: Yes    Alcohol/week: 4.0 standard drinks of alcohol    Types: 1 Glasses of wine, 1 Cans of beer, 1 Shots of liquor, 1 Standard drinks or equivalent per week    Comment: light use; rarely   Drug use: No    Family History: The patient's family history includes Colon cancer in an other family member; Diabetes in his paternal grandfather; Heart attack in his mother; Hypertension in his father and mother; Prostate cancer in his father.  ROS:   Please see the history of present illness.   (+) Urinary bleeding   Additional pertinent ROS otherwise unremarkable  EKGs/Labs/Other Studies Reviewed:    The following studies were reviewed today:  CT Abdomen Pelvis 02/02/2022: IMPRESSION: 1. Persistent obstruction at the left ureterovesical junction by a 3 mm calculus. Since the prior CT of yesterday, the patient has developed increased perinephric soft tissue stranding and fluid with delayed contrast extravasation consistent with pyelosinus extravasation. No significant urinoma at this time. Recommend urology consultation. 2. Additional nonobstructing calculi in the lower pole of the left kidney and within the bladder lumen. 3. 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 Distal colonic diverticulosis. 4.  Aortic Atherosclerosis (ICD10-I70.0). 5. New/enlarging sclerotic osseous lesions from 2020 as recently discussed. Correlate with serum PSA levels.   Atrial Fibrillation Ablation  04/30/2021: CONCLUSIONS: 1. Atrial fibrillation upon presentation.   2. Intracardiac echo reveals a moderate to large sized left atrium. 3. Successful electrical isolation and anatomical encircling of all four pulmonary veins with radiofrequency current.  The patient converted to sinus rhythm with isolation of the left PVs.  The PVs remained in afib while the patient remained in sinus for the duration of the procedure today. 4. Cavo-tricuspid isthmus ablation performed with complete bidirectional isthmus block achieved 5. No inducible arrhythmias following ablation 6. No early apparent complications.   Additional instructions: The patient takes eliquis '5mg'$  BID for stroke prevention.  We will resume eliquis post procedure today.  The patient should not interrupt oral anticoagulation for any reason for 3 months post ablation.  Cardiac CTA 04/23/2021: FINDINGS: Image quality: Average.   Noise artifact is: Limited, moderate, severe. (signal-to-noise (obese)/beam/movement/mis-registration).   Pulmonary Veins: There is normal pulmonary vein drainage into the left atrium (2 on the right and 2 on the left) with ostial measurements as follows:   RUPV: Ostium 18.48m x 14.255m area 1.97cm2   RLPV:  Ostium 26.50m69m 17.7mm71mrea 3.56cm2   LUPV:  Ostium 22.25m x 17.4541marea 2.87cm2   LLPV:  Ostium 19.41m39m 15.6mm39mrea 2.45cm2   Left Atrium: The left atrial size is normal. There is no PFO/ASD. The left atrial appendage is large broccoli type. There is no thrombus in the left atrial appendage on contrast or delayed imaging. The esophagus runs close in proximity to the left pulmonary vein ostia.   Coronary Arteries: CAC score of 297, which is 52nd percentile for age-, race-, and sex-matched controls. Normal coronary origin. Right dominance. The study was performed without use of NTG and is insufficient for plaque evaluation.   Right Atrium: Right atrial size is moderate dilated.   Right  Ventricle: The right ventricular cavity is mildly dilated.   Left Ventricle: The ventricular cavity size is within normal limits. There are no stigmata of prior infarction. There is no abnormal filling defect.   Pericardium: Normal thickness with no significant effusion or calcium present.   Pulmonary Artery: Normal caliber without proximal filling defect.   Cardiac valves: The aortic valve is trileaflet with minimal calcifications. The mitral valve is normal structure without significant calcification.   Aorta: Normal caliber with no significant disease.   Extra-cardiac findings: See attached radiology report for non-cardiac structures.   IMPRESSION: 1. There is normal pulmonary vein drainage into the left atrium with ostial measurements above.   2. There is no thrombus in the left atrial appendage.   3. The esophagus runs in the left atrial midline and is not in proximity to any of the pulmonary vein ostia.   4. No PFO/ASD.   5. Normal coronary origin. Right dominance.   6. CAC score of 0 which is 0 percentile for age-, race-, and sex-matched controls.  Echo 03/21/2021: 1. Left ventricular ejection fraction, by estimation, is 70 to 75%. The  left ventricle has hyperdynamic function. The left ventricle has no  regional wall motion abnormalities. There is mild left ventricular  hypertrophy. Left ventricular diastolic  function could not be evaluated.   2. Right ventricular systolic function is mildly reduced. The right  ventricular size is mildly enlarged. The estimated right ventricular  systolic pressure is 18.265.0g.   3. Left atrial size was severely dilated.   4. Right atrial size was mildly dilated.   5. The mitral valve is grossly normal. Mild mitral valve regurgitation.   6. The aortic valve is tricuspid. Aortic valve regurgitation is mild.  Mild to moderate aortic valve sclerosis/calcification is present, without  any evidence of aortic stenosis.   7. Aortic  dilatation noted. There is borderline dilatation of the aortic  root, measuring 39 mm. There is mild dilatation of the ascending aorta,  measuring 41 mm.   8. The inferior vena cava is normal in size with greater than 50%  respiratory variability, suggesting right atrial pressure of 3 mmHg.   Comparison(s): Changes from prior study are noted. 07/11/2018: LVEF  55-60%, mild RAE, mildly dilated RV, moderate MR.   AAA screening 09/17/2020 ([place]): COMPARISON:  None.   FINDINGS:  Abdominal aortic measurements as follows:   Proximal:  32 cm   Mid:  28 cm   Distal:  23 cm   IMPRESSION:  Ectasia of the proximal abdominal aorta. Recommend follow-up  ultrasound every 3 years. This recommendation follows ACR consensus  guidelines: White Paper of the ACR Incidental Findings Committee II  on Vascular Findings. J Am Coll Radiol 2013; 10:789-794.   Echo 07/11/18 - Left ventricle: The cavity size was normal. Wall thickness was  increased in a pattern of mild LVH. Systolic function was normal.   The estimated ejection fraction was in the range of 55% to 60%.   Wall motion was normal; there were no regional wall motion   abnormalities. There was no evidence of elevated ventricular   filling pressure by Doppler parameters. - Aortic valve: Trileaflet; moderately thickened, moderately   calcified leaflets. Valve mobility was restricted. There was mild   regurgitation. - Mitral valve: There was moderate regurgitation. - Right ventricle: The cavity size was mildly dilated. Wall   thickness was normal. - Right atrium: The atrium was mildly dilated.  EKG:  EKG is personally reviewed.   03/30/22: not ordered today 09/26/2021: not ordered today 03/28/2021: Atrial fibrillation, RBBB at 89 bpm 04/02/2020: sinus bradycardia at 58 bpm with RBBB  Recent Labs: 02/01/2022: ALT 27; Magnesium 2.1 02/04/2022: BUN 19; Creatinine, Ser 1.19; Hemoglobin 14.1; Platelets 152; Potassium 3.7; Sodium 140   Recent Lipid  Panel No results found for: "CHOL", "TRIG", "HDL", "CHOLHDL", "VLDL", "LDLCALC", "LDLDIRECT"  Physical Exam:    VS:  BP 122/68   Pulse 79   Ht '5\' 10"'$  (1.778 m)   Wt 231 lb (104.8 kg)   SpO2 96%   BMI 33.15 kg/m     Wt Readings from Last 3 Encounters:  03/30/22 231 lb (104.8 kg)  02/04/22 225 lb (102.1 kg)  02/02/22 225 lb (102.1 kg)    GEN: Well nourished, well developed in no acute distress HEENT: Normal, moist mucous membranes NECK: No JVD CARDIAC: regular rhythm, normal S1 and S2, no rubs or gallops. No murmur. VASCULAR: Radial and DP pulses 2+ bilaterally. No carotid bruits RESPIRATORY:  Clear to auscultation without rales, wheezing or rhonchi  ABDOMEN: Soft, non-tender, non-distended MUSCULOSKELETAL:  Ambulates independently SKIN: Warm and dry, no edema NEUROLOGIC:  Alert and oriented x 3. No focal neuro deficits noted. PSYCHIATRIC:  Normal affect    ASSESSMENT:    1. Paroxysmal atrial fibrillation (HCC)   2. History of atrial flutter   3. Essential hypertension   4. Secondary hypercoagulable state ([place])   5. Long term current use of anticoagulant   6. At increased risk for cardiovascular disease      PLAN:    History of paroxysmal Atrial flutter (initially typical, then atypical), also with paroxysmal atrial fibrillation during hospitalization 07/2018 -s/p ablation 04/2021 -EF 70-75%, moderate MR, concern for severe LA dilation -CHA2DS2/VAS Stroke Risk Points= 3, on anticoagulation for secondary hypercoagulable state   -he feels that his hematuria has been manageable and generally clears on its own. Discussed that if needed, we can hold rivaroxaban  Hypertension: at goal  -continue losartan, amlodipine  CV risk counseling and prevention: elevated ASCVD risk -recommend heart healthy/Mediterranean diet, with whole grains, fruits, vegetable, fish, lean meats, nuts, and olive oil. Limit salt. -recommend moderate walking, 3-5 times/week for 30-50 minutes each  session. Aim for at least 150 minutes.week. Goal should be pace of 3 miles/hours, or walking 1.5 miles in 30 minutes -recommend avoidance of tobacco products. Avoid excess alcohol. -ASCVD risk score: The 10-year ASCVD risk score (Arnett DK, et al., 2019) is: 29.6%   Values used to calculate the score:     Age: 9 years     Sex: Male     Is Non-Hispanic African American: No     Diabetic: No     Tobacco smoker: No     Systolic Blood Pressure: 638 mmHg     Is BP treated: Yes  HDL Cholesterol: 35 MG/DL     Total Cholesterol: 137 MG/DL  -given elevated ASCVD risk, have discussed statin. Currently on ezetimibe and tolerating  Plan for follow up: 1 year.  Buford Dresser, MD, PhD, [facility]   Medication Adjustments/Labs and Tests Ordered: Current medicines are reviewed at length with the patient today.  Concerns regarding medicines are outlined above.   No orders of the defined types were placed in this encounter.  No orders of the defined types were placed in this encounter.  Patient Instructions  Medication Instructions:  Your Physician recommend you continue on your current medication as directed.    *If you need a refill on your cardiac medications before your next appointment, please call your pharmacy*   Lab Work: None ordered today   Testing/Procedures: None ordered today   Follow-Up: At [facility], you and your health needs are our priority.  As part of our continuing mission to provide you with exceptional heart care, we have created designated Provider Care Teams.  These Care Teams include your primary Cardiologist (physician) and Advanced Practice Providers (APPs -  Physician Assistants and Nurse Practitioners) who all work together to provide you with the care you need, when you need it.  We recommend signing up for the patient portal called "MyChart".  Sign up information is provided on this After Visit Summary.  MyChart  is used to connect with patients for Virtual Visits (Telemedicine).  Patients are able to view lab/test results, encounter notes, upcoming appointments, etc.  Non-urgent messages can be sent to your provider as well.   To learn more about what you can do with MyChart, go to [email or web address].    Your next appointment:   1 year(s)  The format for your next appointment:   In Person  Provider:   Buford Dresser, MD             I,Breanna Adamick,acting as a scribe for Buford Dresser, MD.,have documented all relevant documentation on the behalf of Buford Dresser, MD,as directed by  Buford Dresser, MD while in the presence of Buford Dresser, MD.  I, Buford Dresser, MD, have reviewed all documentation for this visit. The documentation on 03/30/22 for the exam, diagnosis, procedures, and orders are all accurate and complete.   Signed, Buford Dresser, MD PhD 03/30/2022    [place]

## 2022-03-30 NOTE — Patient Instructions (Signed)
Medication Instructions:  Your Physician recommend you continue on your current medication as directed.    *If you need a refill on your cardiac medications before your next appointment, please call your pharmacy*   Lab Work: None ordered today   Testing/Procedures: None ordered today   Follow-Up: At [facility], you and your health needs are our priority.  As part of our continuing mission to provide you with exceptional heart care, we have created designated Provider Care Teams.  These Care Teams include your primary Cardiologist (physician) and Advanced Practice Providers (APPs -  Physician Assistants and Nurse Practitioners) who all work together to provide you with the care you need, when you need it.  We recommend signing up for the patient portal called "MyChart".  Sign up information is provided on this After Visit Summary.  MyChart is used to connect with patients for Virtual Visits (Telemedicine).  Patients are able to view lab/test results, encounter notes, upcoming appointments, etc.  Non-urgent messages can be sent to your provider as well.   To learn more about what you can do with MyChart, go to [email or web address].    Your next appointment:   1 year(s)  The format for your next appointment:   In Person  Provider:   [name], MD          

## 2022-03-31 NOTE — Telephone Encounter (Signed)
Prescription refill request for Xarelto received.  Indication:Afib Last office visit:9/23 Weight:104.8 kg Age:76 Scr:1.1 CrCl:83.36 ml/min  Prescription refilled

## 2022-04-03 ENCOUNTER — Encounter (HOSPITAL_BASED_OUTPATIENT_CLINIC_OR_DEPARTMENT_OTHER): Payer: Self-pay

## 2022-04-26 ENCOUNTER — Other Ambulatory Visit: Payer: Self-pay | Admitting: Physician Assistant

## 2022-08-20 ENCOUNTER — Encounter (HOSPITAL_COMMUNITY): Payer: Self-pay | Admitting: *Deleted

## 2022-10-09 ENCOUNTER — Telehealth: Payer: Self-pay | Admitting: Cardiology

## 2022-10-09 ENCOUNTER — Other Ambulatory Visit (HOSPITAL_BASED_OUTPATIENT_CLINIC_OR_DEPARTMENT_OTHER): Payer: Self-pay

## 2022-10-09 ENCOUNTER — Telehealth (HOSPITAL_BASED_OUTPATIENT_CLINIC_OR_DEPARTMENT_OTHER): Payer: Self-pay | Admitting: Cardiology

## 2022-10-09 NOTE — Telephone Encounter (Signed)
Returned call to patient to discuss the options per Laurann Montana.  Pt prefers to stay with Xarelto since it is once a day dosing. Offered the number for the [employer] assistance program, patient states they would not qualify.  Georgana Curio MHA RN CCM

## 2022-10-09 NOTE — Telephone Encounter (Signed)
[place] for patient to switch back to Eliquis? Dose and labs?

## 2022-10-09 NOTE — Telephone Encounter (Signed)
Returned call to patient to let him know both Eliquis and Xarelto are the same price with Medicare Part D per our Pharm D. Pt wants to change to Eliquis.  Pt request sent to Laurann Montana NP for review.

## 2022-10-09 NOTE — Telephone Encounter (Signed)
Duplicate encounter. See phone note dated same date.   Loel Dubonnet, NP

## 2022-10-09 NOTE — Telephone Encounter (Signed)
Confirmed with test claim through [employer] at [facility] that [place] will still be $139 for one month supply. He is still in the initial phase of his Medicare and will need to meet deductible prior to lower pricing.  If he is unsure what his deductible is recommend he reach out to his insurance company.  If the $139 for 30 days is cheaper than the Xarelto, May Rx Eliquis 5 mg twice daily.  If the $139 is not cheaper than Xarelto and wishes to continue Xarelto - recommend he initiate patient assistance paperwork   He can also reach out to the [place] [employer] at [phone] who can assist to apply for extra hel such as low income subsidy.   If all of these options are cost prohibitive will need to transition to Warfarin but will initial require multiple office visits to ensure levels at goal and thereafter once monthly appointment for finger stick.   Loel Dubonnet, NP

## 2022-10-09 NOTE — Telephone Encounter (Signed)
Pt c/o medication issue:  1. Name of Medication:   rivaroxaban (XARELTO) 20 MG TABS tablet  Eliquis    2. How are you currently taking this medication (dosage and times per day)?   3. Are you having a reaction (difficulty breathing--STAT)?   4. What is your medication issue?  Pt called in wanting to know what would be more cost efficient the Xarelto or Eliquis. Please advise.

## 2022-10-09 NOTE — Telephone Encounter (Signed)
Pt c/o medication issue:  1. Name of Medication:   rivaroxaban (XARELTO) 20 MG TABS tablet    2. How are you currently taking this medication (dosage and times per day)?    3. Are you having a reaction (difficulty breathing--STAT)? no  4. What is your medication issue? Medication is too expensive on patient insurance. Calling to see if it can be switch to eliquis. Please advise

## 2022-10-09 NOTE — Telephone Encounter (Signed)
Patient has [employer] Medicare part D and both medications will be the same price

## 2023-01-12 ENCOUNTER — Emergency Department (HOSPITAL_BASED_OUTPATIENT_CLINIC_OR_DEPARTMENT_OTHER)
Admission: EM | Admit: 2023-01-12 | Discharge: 2023-01-12 | Disposition: A | Discharge status: Home / Self Care | Payer: Medicare Other | Attending: Emergency Medicine | Admitting: Emergency Medicine

## 2023-01-12 ENCOUNTER — Emergency Department (HOSPITAL_BASED_OUTPATIENT_CLINIC_OR_DEPARTMENT_OTHER): Payer: Medicare Other

## 2023-01-12 ENCOUNTER — Encounter (HOSPITAL_BASED_OUTPATIENT_CLINIC_OR_DEPARTMENT_OTHER): Payer: Self-pay | Admitting: Urology

## 2023-01-12 DIAGNOSIS — Z8546 Personal history of malignant neoplasm of prostate: Secondary | ICD-10-CM | POA: Diagnosis not present

## 2023-01-12 DIAGNOSIS — S60949A Unspecified superficial injury of unspecified finger, initial encounter: Secondary | ICD-10-CM | POA: Diagnosis present

## 2023-01-12 DIAGNOSIS — Z79899 Other long term (current) drug therapy: Secondary | ICD-10-CM | POA: Diagnosis not present

## 2023-01-12 DIAGNOSIS — Z7901 Long term (current) use of anticoagulants: Secondary | ICD-10-CM | POA: Diagnosis not present

## 2023-01-12 DIAGNOSIS — W298XXA Contact with other powered powered hand tools and household machinery, initial encounter: Secondary | ICD-10-CM | POA: Diagnosis not present

## 2023-01-12 DIAGNOSIS — S62609B Fracture of unspecified phalanx of unspecified finger, initial encounter for open fracture: Secondary | ICD-10-CM | POA: Insufficient documentation

## 2023-01-12 DIAGNOSIS — I480 Paroxysmal atrial fibrillation: Secondary | ICD-10-CM | POA: Insufficient documentation

## 2023-01-12 DIAGNOSIS — I10 Essential (primary) hypertension: Secondary | ICD-10-CM | POA: Insufficient documentation

## 2023-01-12 DIAGNOSIS — S61111A Laceration without foreign body of right thumb with damage to nail, initial encounter: Secondary | ICD-10-CM | POA: Diagnosis not present

## 2023-01-12 LAB — BASIC METABOLIC PANEL
Anion gap: 8 (ref 5–15)
BUN: 29 mg/dL — ABNORMAL HIGH (ref 8–23)
CO2: 24 mmol/L (ref 22–32)
Calcium: 8.6 mg/dL — ABNORMAL LOW (ref 8.9–10.3)
Chloride: 106 mmol/L (ref 98–111)
Creatinine, Ser: 1.36 mg/dL — ABNORMAL HIGH (ref 0.61–1.24)
GFR, Estimated: 53 mL/min — ABNORMAL LOW (ref >60)
Glucose, Bld: 142 mg/dL — ABNORMAL HIGH (ref 70–99)
Potassium: 4.2 mmol/L (ref 3.5–5.1)
Sodium: 138 mmol/L (ref 135–145)

## 2023-01-12 LAB — CBC
HCT: 40.7 % (ref 39.0–52.0)
Hemoglobin: 13.6 g/dL (ref 13.0–17.0)
MCH: 32.3 pg (ref 26.0–34.0)
MCHC: 33.4 g/dL (ref 30.0–36.0)
MCV: 96.7 fL (ref 80.0–100.0)
Platelets: 176 10*3/uL (ref 150–400)
RBC: 4.21 MIL/uL — ABNORMAL LOW (ref 4.22–5.81)
RDW: 12.9 % (ref 11.5–15.5)
WBC: 5.9 10*3/uL (ref 4.0–10.5)
nRBC: 0 % (ref 0.0–0.2)

## 2023-01-12 MED ORDER — CEFAZOLIN SODIUM-DEXTROSE 2-4 GM/100ML-% IV SOLN
2.0000 g | Freq: Once | INTRAVENOUS | Status: AC
  Administered 2023-01-12: 2 g via INTRAVENOUS
  Filled 2023-01-12: qty 100

## 2023-01-12 MED ORDER — LIDOCAINE HCL 2 % IJ SOLN
10.0000 mL | Freq: Once | INTRAMUSCULAR | Status: AC
  Administered 2023-01-12: 200 mg
  Filled 2023-01-12: qty 20

## 2023-01-12 MED ORDER — OXYCODONE-ACETAMINOPHEN 5-325 MG PO TABS
1.0000 | ORAL_TABLET | Freq: Once | ORAL | Status: AC
  Administered 2023-01-12: 1 via ORAL
  Filled 2023-01-12: qty 1

## 2023-01-12 MED ORDER — CEPHALEXIN 500 MG PO CAPS: 500.0000 mg | ORAL_CAPSULE | Freq: Three times a day (TID) | ORAL | 0 refills | Status: AC

## 2023-01-12 MED ORDER — OXYCODONE HCL 5 MG PO TABS: 5.0000 mg | ORAL_TABLET | ORAL | 0 refills | Status: DC | PRN

## 2023-01-12 MED ORDER — SODIUM CHLORIDE 0.9 % IV BOLUS
1000.0000 mL | Freq: Once | INTRAVENOUS | Status: AC
Start: 2023-01-12 — End: 2023-01-12
  Administered 2023-01-12: 1000 mL via INTRAVENOUS

## 2023-01-12 NOTE — ED Provider Notes (Signed)
[place] [facility] Provider Note  CSN: [id number] Arrival date & time: 01/12/23 1319  Chief Complaint(s) Finger Injury  HPI Charles Cameron is a 77 y.o. male with past medical history as below, significant for afib on xarelto, OSA, HTN who presents to the ED with complaint of finger injury. He is LHD, was using a drill press earlier today while building a deck, the drill press malfunctioned and the bit became dislodged and flew from the machine and struck him in the hand. Severe pain to right thumb, difficulty with rom to thumb 2/2 the pain. No other injuries. Last tetanus was around 3 yrs ago per pt. Last dose of xarelto this morning.   Past Medical History Past Medical History:  Diagnosis Date   Anticoagulation adequate 07/14/2018   Edema 07/14/2018   Hypertension    OSA (obstructive sleep apnea)    not tolerant of CPAP   Persistent atrial fibrillation (HCC)    Prostate cancer (HCC) 2016   Skin Cancer    URI (upper respiratory infection) 07/14/2018   Patient Active Problem List   Diagnosis Date Noted   OSA (obstructive sleep apnea) 08/29/2021   Persistent atrial fibrillation (HCC) 02/04/2021   Secondary hypercoagulable state (HCC) 02/04/2021   Essential hypertension 09/06/2018   Long term (current) use of anticoagulants 08/15/2018   Paroxysmal atrial fibrillation (HCC) 08/15/2018   Atrial flutter (HCC) 07/10/2018   Home Medication(s) Prior to Admission medications   Medication Sig Start Date End Date Taking? Authorizing Provider  cephALEXin (KEFLEX) 500 MG capsule Take 1 capsule (500 mg total) by mouth 3 (three) times daily for 7 days. 01/12/23 01/19/23 Yes Tanda Rockers A, DO  oxyCODONE (ROXICODONE) 5 MG immediate release tablet Take 1 tablet (5 mg total) by mouth every 4 (four) hours as needed for up to 15 doses for severe pain. 01/12/23  Yes Tanda Rockers A, DO  amLODipine (NORVASC) 5 MG tablet Take 5 mg by mouth every morning. 12/02/21    [provider]  Cyanocobalamin (VITAMIN B-12) 5000 MCG SUBL Place 5,000 mcg under the tongue every morning.    [provider]  ezetimibe (ZETIA) 10 MG tablet Take 10 mg by mouth daily with supper. 08/05/21   [provider]  ibuprofen (ADVIL) 200 MG tablet Take 600 mg by mouth every 6 (six) hours as needed for moderate pain.    [provider]  levocetirizine (XYZAL) 5 MG tablet Take 5 mg by mouth in the morning.    [provider]  losartan (COZAAR) 100 MG tablet Take 100 mg by mouth daily. 02/10/22   [provider]  Misc Natural Products (GLUCOSAMINE CHONDROITIN TRIPLE) TABS Take 2 tablets by mouth every morning.    [provider]  Multiple Vitamin (MULTIVITAMIN WITH MINERALS) TABS tablet Take 1 tablet by mouth every morning.    [provider]  Omega-3 Fatty Acids (FISH OIL) 1000 MG CAPS Take 1,000 mg by mouth every morning.    [provider]  rivaroxaban (XARELTO) 20 MG TABS tablet TAKE 1 TABLET BY MOUTH DAILY WITH SUPPER. 03/31/22   Hillis Range, MD  Past Surgical History Past Surgical History:  Procedure Laterality Date   ATRIAL FIBRILLATION ABLATION N/A 04/30/2021   Procedure: ATRIAL FIBRILLATION ABLATION;  Surgeon: Hillis Range, MD;  Location: [facility];  Service: Cardiovascular;  Laterality: N/A;   CYSTOSCOPY WITH RETROGRADE PYELOGRAM, URETEROSCOPY AND STENT PLACEMENT Left 02/02/2022   Procedure: CYSTOSCOPY WITH RETROGRADE PYELOGRAM, URETEROSCOPY AND STENT PLACEMENT;  Surgeon: Despina Arias, MD;  Location: [facility];  Service: Urology;  Laterality: Left;  1.  Left ureteroscopy with laser lithotripsy and basket extraction of stones 2. Cystoscopy  3. Left retrograde pyelogram 4. Left ureteral stent placement (6x26 cm) 5. Fluoroscopy with intraoperative interpretation     LUMBAR LAMINECTOMY  07/14/1987   PROSTATECTOMY     Family History Family History  Problem Relation Age of Onset   Heart attack Mother    Hypertension Mother    Prostate cancer Father    Hypertension Father    Colon cancer Other    Diabetes Paternal Grandfather     Social History Social History   Tobacco Use   Smoking status: Former    Packs/day: 2.00    Years: 27.00    Additional pack years: 0.00    Total pack years: 54.00    Types: Cigarettes    Quit date: 07/14/1983    Years since quitting: 39.5    Passive exposure: Past   Smokeless tobacco: Never   Tobacco comments:    Former smoker 05/28/2021  Vaping Use   Vaping Use: Never used  Substance Use Topics   Alcohol use: Yes    Alcohol/week: 4.0 standard drinks of alcohol    Types: 1 Glasses of wine, 1 Cans of beer, 1 Shots of liquor, 1 Standard drinks or equivalent per week    Comment: light use; rarely   Drug use: No   Allergies Patient has no known allergies.  Review of Systems Review of Systems  Constitutional:  Negative for chills and fever.  HENT:  Negative for facial swelling and trouble swallowing.   Eyes:  Negative for photophobia and visual disturbance.  Respiratory:  Negative for cough and shortness of breath.   Cardiovascular:  Negative for chest pain and palpitations.  Gastrointestinal:  Negative for abdominal pain, nausea and vomiting.  Endocrine: Negative for polydipsia and polyuria.  Genitourinary:  Negative for difficulty urinating and hematuria.  Musculoskeletal:  Negative for gait problem and joint swelling.  Skin:  Positive for wound. Negative for pallor and rash.  Neurological:  Negative for syncope and headaches.  Psychiatric/Behavioral:  Negative for agitation and confusion.     Physical Exam Vital Signs  I have reviewed the triage vital signs BP 139/85 (BP Location: Left Arm)   Pulse 84   Temp 97.7 F (36.5 C)   Resp 18   Ht 5\' 10"  (1.778 m)   Wt 99.8 kg   SpO2 98%   BMI  31.57 kg/m  Physical Exam Vitals and nursing note reviewed.  Constitutional:      General: He is in acute distress.     Appearance: Normal appearance. He is well-developed.     Comments: Acute distress 2/2 pain  HENT:     Head: Normocephalic and atraumatic.     Right Ear: External ear normal.     Left Ear: External ear normal.     Mouth/Throat:     Mouth: Mucous membranes are moist.  Eyes:     General: No scleral icterus. Cardiovascular:     Rate and Rhythm: Normal rate and regular  rhythm.     Pulses: Normal pulses.     Heart sounds: Normal heart sounds.  Pulmonary:     Effort: Pulmonary effort is normal. No respiratory distress.     Breath sounds: Normal breath sounds.  Abdominal:     General: Abdomen is flat.     Palpations: Abdomen is soft.     Tenderness: There is no abdominal tenderness.  Musculoskeletal:       Hands:     Right lower leg: No edema.     Left lower leg: No edema.  Skin:    General: Skin is warm and dry.     Capillary Refill: Capillary refill takes less than 2 seconds.  Neurological:     Mental Status: He is alert and oriented to person, place, and time.  Psychiatric:        Mood and Affect: Mood normal.        Behavior: Behavior normal.          ED Results and Treatments Labs (all labs ordered are listed, but only abnormal results are displayed) Labs Reviewed  CBC - Abnormal; Notable for the following components:      Result Value   RBC 4.21 (*)    All other components within normal limits  BASIC METABOLIC PANEL - Abnormal; Notable for the following components:   Glucose, Bld 142 (*)    BUN 29 (*)    Creatinine, Ser 1.36 (*)    Calcium 8.6 (*)    GFR, Estimated 53 (*)    All other components within normal limits                                                                                                                          Radiology No results found.  Pertinent labs & imaging results that were available during my care of  the patient were reviewed by me and considered in my medical decision making (see MDM for details).  Medications Ordered in ED Medications  sodium chloride 0.9 % bolus 1,000 mL ( Intravenous Stopped 01/12/23 1351)  oxyCODONE-acetaminophen (PERCOCET/ROXICET) 5-325 MG per tablet 1 tablet (1 tablet Oral Given 01/12/23 1339)  ceFAZolin (ANCEF) IVPB 2g/100 mL premix (0 g Intravenous Stopped 01/12/23 1428)  lidocaine (XYLOCAINE) 2 % (with pres) injection 200 mg (200 mg Other Given by Other 01/12/23 1427)  oxyCODONE-acetaminophen (PERCOCET/ROXICET) 5-325 MG per tablet 1 tablet (1 tablet Oral Given 01/12/23 1642)  Procedures .Marland KitchenLaceration Repair  Date/Time: 01/12/2023 2:00 PM  Performed by: Sloan Leiter, DO Authorized by: Sloan Leiter, DO   Consent:    Consent obtained:  Verbal   Consent given by:  Patient   Risks, benefits, and alternatives were discussed: yes     Risks discussed:  Infection, need for additional repair and nerve damage   Alternatives discussed:  No treatment and delayed treatment Universal protocol:    Procedure explained and questions answered to patient or proxy's satisfaction: yes     Immediately prior to procedure, a time out was called: yes     Patient identity confirmed:  Verbally with patient and arm band Anesthesia:    Anesthesia method:  Local infiltration   Local anesthetic:  Lidocaine 2% w/o epi Laceration details:    Location:  Finger   Finger location:  R thumb   Length (cm):  4   Depth (mm):  5 Pre-procedure details:    Preparation:  Patient was prepped and draped in usual sterile fashion and imaging obtained to evaluate for foreign bodies Exploration:    Limited defect created (wound extended): yes     Hemostasis achieved with:  Direct pressure   Imaging obtained: x-ray     Imaging outcome: foreign body not noted     Wound  exploration: wound explored through full range of motion and entire depth of wound visualized     Contaminated: yes   Treatment:    Area cleansed with:  Saline   Amount of cleaning:  Extensive   Irrigation solution:  Sterile saline   Irrigation volume:  200   Irrigation method:  Pressure wash   Visualized foreign bodies/material removed: no     Debridement:  Minimal   Undermining:  None   Scar revision: no   Skin repair:    Repair method:  Sutures   Suture size:  3-0 and 4-0   Suture material:  Nylon   Suture technique:  Simple interrupted   Number of sutures:  7 Approximation:    Approximation:  Loose Repair type:    Repair type:  Intermediate Post-procedure details:    Dressing:  Antibiotic ointment and adhesive bandage   Procedure completion:  Tolerated well, no immediate complications .Marland KitchenLaceration Repair  Date/Time: 01/12/2023 2:01 PM  Performed by: Sloan Leiter, DO Authorized by: Sloan Leiter, DO   Consent:    Consent obtained:  Verbal   Consent given by:  Patient   Risks, benefits, and alternatives were discussed: yes     Risks discussed:  Infection, need for additional repair and nerve damage   Alternatives discussed:  No treatment and delayed treatment Universal protocol:    Procedure explained and questions answered to patient or proxy's satisfaction: yes     Patient identity confirmed:  Verbally with patient and arm band Anesthesia:    Anesthesia method:  Local infiltration   Local anesthetic:  Lidocaine 2% w/o epi Laceration details:    Location:  Finger   Finger location:  R thumb   Length (cm):  2.5   Depth (mm):  3 Pre-procedure details:    Preparation:  Patient was prepped and draped in usual sterile fashion and imaging obtained to evaluate for foreign bodies Exploration:    Hemostasis achieved with:  Direct pressure   Imaging obtained: x-ray     Imaging outcome: foreign body not noted     Wound exploration: wound explored through full range of  motion and entire depth of wound visualized  Contaminated: yes   Treatment:    Area cleansed with:  Saline   Amount of cleaning:  Extensive   Irrigation solution:  Sterile saline   Irrigation volume:  200   Irrigation method:  Pressure wash   Debridement:  None   Undermining:  None Skin repair:    Repair method:  Sutures   Suture size:  3-0   Suture material:  Nylon   Suture technique:  Simple interrupted   Number of sutures:  3 Approximation:    Approximation:  Loose Repair type:    Repair type:  Simple Post-procedure details:    Dressing:  Antibiotic ointment and adhesive bandage   Procedure completion:  Tolerated well, no immediate complications   (including critical care time)  Medical Decision Making / ED Course    Medical Decision Making:    DAYTON ROPP is a 77 y.o. male hx as above including afib on xarelto, LHD, here with R thumb injury. The complaint involves an extensive differential diagnosis and also carries with it a high risk of complications and morbidity.  Serious etiology was considered. Ddx includes but is not limited to: fx, soft tissue, fb, vascular injury, msk injury ,etc  Complete initial physical exam performed, notably the patient  was HDS, appears to be in pain.    Reviewed and confirmed nursing documentation for past medical history, family history, social history.  Vital signs reviewed.    Clinical Course as of 01/14/23 0800  Tue Jan 12, 2023  1553 Spoke with PA Tinnie Gens, d/w Dr Frazier Butt, agree w/ loose closure, aluminum splint, keflex for home, f/u in office in next week or so [SG]    Clinical Course User Index [SG] Tanda Rockers A, DO   Sig thumb injury w/ lac.  Complicated lac, pos fx Given ancef, analgesia Will check xr and screening labs  Plan for lac repair which was completed successfully Phalanx fx is fairly complex, will defer to hand specialist for further management   He is up-to-date on tetanus, give oral antibiotics,  he was given Ancef in the ED. Follow-up with Dr. Leanne Chang in the office  The patient improved significantly and was discharged in stable condition. Detailed discussions were had with the patient regarding current findings, and need for close f/u with PCP or on call doctor. The patient has been instructed to return immediately if the symptoms worsen in any way for re-evaluation. Patient verbalized understanding and is in agreement with current care plan. All questions answered prior to discharge.      Additional history obtained: -Additional history obtained from family -External records from outside source obtained and reviewed including: Chart review including previous notes, labs, imaging, consultation notes including home meds, pcp documentation, prior labs/imaging    Lab Tests: -I ordered, reviewed, and interpreted labs.   The pertinent results include:   Labs Reviewed  CBC - Abnormal; Notable for the following components:      Result Value   RBC 4.21 (*)    All other components within normal limits  BASIC METABOLIC PANEL - Abnormal; Notable for the following components:   Glucose, Bld 142 (*)    BUN 29 (*)    Creatinine, Ser 1.36 (*)    Calcium 8.6 (*)    GFR, Estimated 53 (*)    All other components within normal limits    Notable for stable  EKG   EKG Interpretation Date/Time:    Ventricular Rate:    PR Interval:    QRS Duration:  QT Interval:    QTC Calculation:   R Axis:      Text Interpretation:           Imaging Studies ordered: I ordered imaging studies including thumb xr I independently visualized the following imaging with scope of interpretation limited to determining acute life threatening conditions related to emergency care; findings noted above, significant for complex fx I independently visualized and interpreted imaging. I agree with the radiologist interpretation   Medicines ordered and prescription drug management: Meds ordered this  encounter  Medications   sodium chloride 0.9 % bolus 1,000 mL   oxyCODONE-acetaminophen (PERCOCET/ROXICET) 5-325 MG per tablet 1 tablet   ceFAZolin (ANCEF) IVPB 2g/100 mL premix    Order Specific Question:   Antibiotic Indication:    Answer:   Surgical Prophylaxis   lidocaine (XYLOCAINE) 2 % (with pres) injection 200 mg   cephALEXin (KEFLEX) 500 MG capsule    Sig: Take 1 capsule (500 mg total) by mouth 3 (three) times daily for 7 days.    Dispense:  21 capsule    Refill:  0   oxyCODONE (ROXICODONE) 5 MG immediate release tablet    Sig: Take 1 tablet (5 mg total) by mouth every 4 (four) hours as needed for up to 15 doses for severe pain.    Dispense:  15 tablet    Refill:  0   oxyCODONE-acetaminophen (PERCOCET/ROXICET) 5-325 MG per tablet 1 tablet    -I have reviewed the patients home medicines and have made adjustments as needed   Consultations Obtained: I requested consultation with the ortho,  and discussed lab and imaging findings as well as pertinent plan - they recommend: loose repair, splint, abx, f/u office   Cardiac Monitoring: The patient was maintained on a cardiac monitor.  I personally viewed and interpreted the cardiac monitored which showed an underlying rhythm of: NSR  Social Determinants of Health:  Diagnosis or treatment significantly limited by social determinants of health: former smoker   Reevaluation: After the interventions noted above, I reevaluated the patient and found that they have improved  Co morbidities that complicate the patient evaluation  Past Medical History:  Diagnosis Date   Anticoagulation adequate 07/14/2018   Edema 07/14/2018   Hypertension    OSA (obstructive sleep apnea)    not tolerant of CPAP   Persistent atrial fibrillation (HCC)    Prostate cancer (HCC) 2016   Skin Cancer    URI (upper respiratory infection) 07/14/2018      Dispostion: Disposition decision including need for hospitalization was considered, and patient  discharged from emergency department.    Final Clinical Impression(s) / ED Diagnoses Final diagnoses:  Open fracture of phalanx of digit of hand, initial encounter  Laceration of right thumb without foreign body with damage to nail, initial encounter     This chart was dictated using voice recognition software.  Despite best efforts to proofread,  errors can occur which can change the documentation meaning.    Tanda Rockers A, DO 01/14/23 0800

## 2023-01-12 NOTE — ED Notes (Signed)
Cleaned pts' hand with dermal cleanser, nonstick bandage, wrapped with kerlix and secured with medi pore tape.  Explained wound care to pt and family, given a bunch of supplies, considering pt is on xarelto.  Pt and family understood instructions.

## 2023-01-12 NOTE — ED Notes (Signed)
Discharge instructions reviewed with patient. Patient verbalizes understanding, no further questions at this time. Medications/prescriptions and follow up information provided. No acute distress noted at time of departure.  

## 2023-01-12 NOTE — ED Triage Notes (Signed)
Right thumb injury, drill press bit broke off and hit it  Possible open fracture, large lac to anterior thumb TDAP 3 yrs ago, UTD

## 2023-01-12 NOTE — Discharge Instructions (Addendum)
Keep the wound clean and as dry as possible. Do not immerse or soak the wound in water. This means no swimming, washing dishes (unless thick rubber gloves are used), baths, or hot tubs until the stitches are removed or after about two weeks if absorbable suture material was used. Leave original bandages on the wound for the first 24 hours. After this time, showering or rinsing is recommended, rather than bathing. the first day, remove old bandages and gently cleanse the wound with soap and water. Cleansing twice a day prevents buildup of debris and will result in easier suture removal. Have the wound reevaluated for potential suture removal on the Upper extremity in 7-10 days 

## 2023-01-15 ENCOUNTER — Telehealth: Payer: Self-pay | Admitting: *Deleted

## 2023-01-15 NOTE — Telephone Encounter (Signed)
Patient with diagnosis of atrial fibrillation on Xarelto for anticoagulation.    Procedure:   RIGHT THUMB  WOUND I & D   Date of Surgery:  Clearance 01/21/23         CHA2DS2-VASc Score = 3   This indicates a 3.2% annual risk of stroke. The patient's score is based upon: CHF History: 0 HTN History: 1 Diabetes History: 0 Stroke History: 0 Vascular Disease History: 0 Age Score: 2 Gender Score: 0    CrCl 66 Platelet count 156    Per office protocol, patient can hold Xarelto for 1-2 days prior to procedure.   Patient will not need bridging with Lovenox (enoxaparin) around procedure.  **This guidance is not considered finalized until pre-operative APP has relayed final recommendations.**

## 2023-01-15 NOTE — Telephone Encounter (Signed)
   Name: Charles Cameron  DOB: 1945/09/07  MRN: [id number]   Primary Cardiologist: Jodelle Red, MD  Chart reviewed as part of pre-operative protocol coverage.  Per pharm D: Patient with diagnosis of atrial fibrillation on Xarelto for anticoagulation.     Procedure:   RIGHT THUMB  WOUND I & D   Date of Surgery:  Clearance 01/21/23           CHA2DS2-VASc Score = 3   This indicates a 3.2% annual risk of stroke. The patient's score is based upon: CHF History: 0 HTN History: 1 Diabetes History: 0 Stroke History: 0 Vascular Disease History: 0 Age Score: 2 Gender Score: 0   CrCl 66 Platelet count 156    Per office protocol, patient can hold Xarelto for 1-2 days prior to procedure.   Patient will not need bridging with Lovenox (enoxaparin) around procedure.  I will route this recommendation to the requesting party via Epic fax function and remove from pre-op pool. Please call with questions.  Carlos Levering, NP 01/15/2023, 4:30 PM

## 2023-01-15 NOTE — Telephone Encounter (Signed)
   Pre-operative Risk Assessment    Patient Name: Charles Cameron  DOB: 04/09/1946 MRN: [id number]      Request for Surgical Clearance    Procedure:   RIGHT THUMB  WOUND I & D  Date of Surgery:  Clearance 01/21/23                                  Surgeon:  Dr Smith Robert   Surgeon's Group or Practice Name:  [facility] Phone number:  [phone] Fax number:  [phone]   Type of Clearance Requested:   - Pharmacy:  Hold Rivaroxaban (Xarelto) HOW LONG TO HOLD   Type of Anesthesia:  Not Indicated         N/A   Signed, Oleta Mouse   01/15/2023, 2:51 PM

## 2023-01-15 NOTE — Telephone Encounter (Signed)
Please advise holding Xarelto prior to right thumb wound I&D scheduled for 01/21/2023.  Thank you!  DW

## 2023-01-18 ENCOUNTER — Other Ambulatory Visit: Payer: Self-pay

## 2023-01-18 ENCOUNTER — Encounter (HOSPITAL_BASED_OUTPATIENT_CLINIC_OR_DEPARTMENT_OTHER)
Admission: RE | Admit: 2023-01-18 | Discharge: 2023-01-18 | Disposition: A | Discharge status: Home / Self Care | Payer: Medicare Other | Source: Ambulatory Visit | Attending: Orthopedic Surgery | Admitting: Orthopedic Surgery

## 2023-01-18 ENCOUNTER — Encounter (HOSPITAL_BASED_OUTPATIENT_CLINIC_OR_DEPARTMENT_OTHER): Payer: Self-pay | Admitting: Orthopedic Surgery

## 2023-01-18 DIAGNOSIS — Z01810 Encounter for preprocedural cardiovascular examination: Secondary | ICD-10-CM | POA: Diagnosis not present

## 2023-01-18 NOTE — Progress Notes (Signed)
   01/18/23 8119  Pre-op Phone Call  Surgery Date Verified 01/20/23  Arrival Time Verified 1100  Surgery Location Verified [facility]  Medical History Reviewed Yes  Is the patient taking a GLP-1 receptor agonist? No  Does the patient have diabetes? No diagnosis of diabetes  Do you have a history of heart problems? Yes (follows up with cardio (note 9--18-23 in epic); xarelto instructions also in epic)  Antiarrhythmic device type  (NA)  Does patient have other implanted devices? No  Patient educated about smoking cessation 24 hours prior to surgery. N/A Non-Smoker  Patient verbalizes understanding of bowel prep? N/A  Med Rec Completed Yes  Take the Following Meds the Morning of Surgery amlodipine am sx with sip water; hold xarelto 1-2 days (per note in epic); no herb/vit/nsaid x5d  Recent  Lab Work, EKG, CXR? Yes  NPO (Including gum & candy) After midnight  Stop Solids, Milk, Candy, and Gum STARTING AT MIDNIGHT  Responsible adult to drive and be with you for 24 hours? Yes  Name & Phone Number for Ride/Caregiver friend, [place]  No Jewelry, money, nail polish or make-up.  No lotions, powders, perfumes. No shaving  48 hrs. prior to surgery. Yes  Contacts, Dentures & Glasses Will Have to be Removed Before OR. Yes  Please bring your ID and Insurance Card the morning of your surgery. (Surgery Centers Only) Yes  Bring any papers or x-rays with you that your surgeon gave you. Yes  Call this number the morning of surgery  with any problems that may cancel your surgery. [phone]  Covid-19 Assessment  Have you had a positive COVID-19 test within the previous 90 days? No  COVID Testing Guidance Proceed with the additional questions.  Patient's surgery required a COVID-19 test (cardiothoracic, complex ENT, and bronchoscopies/ EBUS) No  Have you been unmasked and in close contact with anyone with COVID-19 or COVID-19 symptoms within the past 10 days? No  Do you or anyone in your household currently  have any COVID-19 symptoms? No

## 2023-01-20 ENCOUNTER — Encounter (HOSPITAL_BASED_OUTPATIENT_CLINIC_OR_DEPARTMENT_OTHER): Payer: Self-pay | Admitting: Orthopedic Surgery

## 2023-01-20 ENCOUNTER — Ambulatory Visit (HOSPITAL_BASED_OUTPATIENT_CLINIC_OR_DEPARTMENT_OTHER): Payer: Medicare Other | Admitting: Anesthesiology

## 2023-01-20 ENCOUNTER — Encounter (HOSPITAL_BASED_OUTPATIENT_CLINIC_OR_DEPARTMENT_OTHER)
Admission: RE | Disposition: A | Discharge status: Home / Self Care | Payer: Self-pay | Source: Home / Self Care | Attending: Orthopedic Surgery

## 2023-01-20 ENCOUNTER — Ambulatory Visit (HOSPITAL_BASED_OUTPATIENT_CLINIC_OR_DEPARTMENT_OTHER): Payer: Medicare Other

## 2023-01-20 ENCOUNTER — Other Ambulatory Visit: Payer: Self-pay

## 2023-01-20 ENCOUNTER — Ambulatory Visit (HOSPITAL_BASED_OUTPATIENT_CLINIC_OR_DEPARTMENT_OTHER)
Admission: RE | Admit: 2023-01-20 | Discharge: 2023-01-20 | Disposition: A | Discharge status: Home / Self Care | Payer: Medicare Other | Attending: Orthopedic Surgery | Admitting: Orthopedic Surgery

## 2023-01-20 DIAGNOSIS — W298XXA Contact with other powered powered hand tools and household machinery, initial encounter: Secondary | ICD-10-CM | POA: Insufficient documentation

## 2023-01-20 DIAGNOSIS — Z87891 Personal history of nicotine dependence: Secondary | ICD-10-CM | POA: Diagnosis not present

## 2023-01-20 DIAGNOSIS — I4819 Other persistent atrial fibrillation: Secondary | ICD-10-CM | POA: Insufficient documentation

## 2023-01-20 DIAGNOSIS — I10 Essential (primary) hypertension: Secondary | ICD-10-CM | POA: Diagnosis not present

## 2023-01-20 DIAGNOSIS — I480 Paroxysmal atrial fibrillation: Secondary | ICD-10-CM

## 2023-01-20 DIAGNOSIS — Z09 Encounter for follow-up examination after completed treatment for conditions other than malignant neoplasm: Secondary | ICD-10-CM | POA: Diagnosis not present

## 2023-01-20 DIAGNOSIS — Z8546 Personal history of malignant neoplasm of prostate: Secondary | ICD-10-CM | POA: Insufficient documentation

## 2023-01-20 DIAGNOSIS — S62521A Displaced fracture of distal phalanx of right thumb, initial encounter for closed fracture: Secondary | ICD-10-CM | POA: Insufficient documentation

## 2023-01-20 DIAGNOSIS — G4733 Obstructive sleep apnea (adult) (pediatric): Secondary | ICD-10-CM | POA: Insufficient documentation

## 2023-01-20 DIAGNOSIS — S62521B Displaced fracture of distal phalanx of right thumb, initial encounter for open fracture: Secondary | ICD-10-CM

## 2023-01-20 DIAGNOSIS — Z08 Encounter for follow-up examination after completed treatment for malignant neoplasm: Secondary | ICD-10-CM | POA: Insufficient documentation

## 2023-01-20 DIAGNOSIS — S61011A Laceration without foreign body of right thumb without damage to nail, initial encounter: Secondary | ICD-10-CM | POA: Diagnosis not present

## 2023-01-20 DIAGNOSIS — Z419 Encounter for procedure for purposes other than remedying health state, unspecified: Secondary | ICD-10-CM

## 2023-01-20 DIAGNOSIS — Z7901 Long term (current) use of anticoagulants: Secondary | ICD-10-CM | POA: Diagnosis not present

## 2023-01-20 HISTORY — PX: INCISION AND DRAINAGE OF WOUND: SHX1803

## 2023-01-20 HISTORY — PX: NAILBED REPAIR: SHX5028

## 2023-01-20 HISTORY — PX: PERCUTANEOUS PINNING: SHX2209

## 2023-01-20 SURGERY — IRRIGATION AND DEBRIDEMENT WOUND
Anesthesia: General | Site: Thumb | Laterality: Right

## 2023-01-20 MED ORDER — FENTANYL CITRATE (PF) 100 MCG/2ML IJ SOLN: 25.0000 ug | INTRAMUSCULAR | Status: DC | PRN

## 2023-01-20 MED ORDER — DEXMEDETOMIDINE HCL IN NACL 80 MCG/20ML IV SOLN
INTRAVENOUS | Status: DC | PRN
  Administered 2023-01-20 (×2): 4 ug via INTRAVENOUS

## 2023-01-20 MED ORDER — OXYCODONE HCL 5 MG PO TABS: 5.0000 mg | ORAL_TABLET | Freq: Four times a day (QID) | ORAL | 0 refills | Status: AC | PRN

## 2023-01-20 MED ORDER — BUPIVACAINE HCL (PF) 0.25 % IJ SOLN
INTRAMUSCULAR | Status: AC
  Filled 2023-01-20: qty 30

## 2023-01-20 MED ORDER — CEFAZOLIN SODIUM-DEXTROSE 2-4 GM/100ML-% IV SOLN
2.0000 g | INTRAVENOUS | Status: AC
  Administered 2023-01-20: 2 g via INTRAVENOUS

## 2023-01-20 MED ORDER — OXYCODONE HCL 5 MG PO TABS
5.0000 mg | ORAL_TABLET | Freq: Once | ORAL | Status: AC
  Administered 2023-01-20: 5 mg via ORAL

## 2023-01-20 MED ORDER — OXYCODONE HCL 5 MG PO TABS
ORAL_TABLET | ORAL | Status: AC
  Filled 2023-01-20: qty 1

## 2023-01-20 MED ORDER — ACETAMINOPHEN 500 MG PO TABS
ORAL_TABLET | ORAL | Status: AC
  Filled 2023-01-20: qty 2

## 2023-01-20 MED ORDER — LIDOCAINE 2% (20 MG/ML) 5 ML SYRINGE
INTRAMUSCULAR | Status: DC | PRN
  Administered 2023-01-20: 40 mg via INTRAVENOUS

## 2023-01-20 MED ORDER — FENTANYL CITRATE (PF) 100 MCG/2ML IJ SOLN
INTRAMUSCULAR | Status: DC | PRN
  Administered 2023-01-20 (×2): 50 ug via INTRAVENOUS

## 2023-01-20 MED ORDER — LIDOCAINE HCL (PF) 1 % IJ SOLN
INTRAMUSCULAR | Status: AC
  Filled 2023-01-20: qty 30

## 2023-01-20 MED ORDER — PROPOFOL 500 MG/50ML IV EMUL
INTRAVENOUS | Status: DC | PRN
  Administered 2023-01-20: 50 ug/kg/min via INTRAVENOUS

## 2023-01-20 MED ORDER — LACTATED RINGERS IV SOLN: INTRAVENOUS | Status: DC

## 2023-01-20 MED ORDER — LIDOCAINE HCL 1 % IJ SOLN
INTRAMUSCULAR | Status: DC | PRN
  Administered 2023-01-20: 10 mL via INTRAMUSCULAR

## 2023-01-20 MED ORDER — CEFAZOLIN SODIUM-DEXTROSE 2-4 GM/100ML-% IV SOLN
INTRAVENOUS | Status: AC
  Filled 2023-01-20: qty 100

## 2023-01-20 MED ORDER — EPHEDRINE SULFATE (PRESSORS) 50 MG/ML IJ SOLN
INTRAMUSCULAR | Status: DC | PRN
  Administered 2023-01-20 (×2): 5 mg via INTRAVENOUS

## 2023-01-20 MED ORDER — PROPOFOL 10 MG/ML IV BOLUS
INTRAVENOUS | Status: DC | PRN
  Administered 2023-01-20: 20 mg via INTRAVENOUS
  Administered 2023-01-20: 30 mg via INTRAVENOUS

## 2023-01-20 MED ORDER — FENTANYL CITRATE (PF) 100 MCG/2ML IJ SOLN
INTRAMUSCULAR | Status: AC
  Filled 2023-01-20: qty 2

## 2023-01-20 MED ORDER — ACETAMINOPHEN 500 MG PO TABS
1000.0000 mg | ORAL_TABLET | Freq: Once | ORAL | Status: AC
  Administered 2023-01-20: 1000 mg via ORAL

## 2023-01-20 MED ORDER — ONDANSETRON HCL 4 MG/2ML IJ SOLN: 4.0000 mg | Freq: Once | INTRAMUSCULAR | Status: DC | PRN

## 2023-01-20 MED ORDER — SODIUM CHLORIDE 0.9 % IR SOLN
Status: DC | PRN
  Administered 2023-01-20: 2000 mL

## 2023-01-20 SURGICAL SUPPLY — 53 items
APL PRP STRL LF DISP 70% ISPRP (MISCELLANEOUS)
BLADE SURG 15 STRL LF DISP TIS (BLADE) IMPLANT
BLADE SURG 15 STRL SS (BLADE) ×1
BNDG CMPR 5X3 KNIT ELC UNQ LF (GAUZE/BANDAGES/DRESSINGS) ×1
BNDG CMPR 9X4 STRL LF SNTH (GAUZE/BANDAGES/DRESSINGS)
BNDG COHESIVE 1X5 TAN STRL LF (GAUZE/BANDAGES/DRESSINGS) IMPLANT
BNDG ELASTIC 3INX 5YD STR LF (GAUZE/BANDAGES/DRESSINGS) ×1 IMPLANT
BNDG ESMARK 4X9 LF (GAUZE/BANDAGES/DRESSINGS) IMPLANT
BNDG GAUZE DERMACEA FLUFF 4 (GAUZE/BANDAGES/DRESSINGS) ×1 IMPLANT
BNDG GZE DERMACEA 4 6PLY (GAUZE/BANDAGES/DRESSINGS) ×1
CHLORAPREP W/TINT 26 (MISCELLANEOUS) ×1 IMPLANT
CORD BIPOLAR FORCEPS 12FT (ELECTRODE) IMPLANT
COVER BACK TABLE 60X90IN (DRAPES) ×1 IMPLANT
COVER MAYO STAND STRL (DRAPES) ×1 IMPLANT
CUFF TOURN SGL QUICK 18X4 (TOURNIQUET CUFF) IMPLANT
CUFF TOURN SGL QUICK 24 (TOURNIQUET CUFF)
CUFF TRNQT CYL 24X4X16.5-23 (TOURNIQUET CUFF) IMPLANT
DRAIN PENROSE 12X.25 LTX STRL (MISCELLANEOUS) IMPLANT
DRAPE EXTREMITY T 121X128X90 (DISPOSABLE) ×1 IMPLANT
DRAPE OEC MINIVIEW 54X84 (DRAPES) ×1 IMPLANT
DRAPE SURG 17X23 STRL (DRAPES) ×1 IMPLANT
GAUZE 4X4 16PLY ~~LOC~~+RFID DBL (SPONGE) IMPLANT
GAUZE SPONGE 4X4 12PLY STRL (GAUZE/BANDAGES/DRESSINGS) IMPLANT
GAUZE XEROFORM 1X8 LF (GAUZE/BANDAGES/DRESSINGS) ×1 IMPLANT
GLOVE BIO SURGEON STRL SZ7 (GLOVE) ×1 IMPLANT
GLOVE BIOGEL PI IND STRL 7.0 (GLOVE) IMPLANT
GOWN STRL REUS W/ TWL LRG LVL3 (GOWN DISPOSABLE) ×2 IMPLANT
GOWN STRL REUS W/ TWL XL LVL3 (GOWN DISPOSABLE) IMPLANT
GOWN STRL REUS W/TWL LRG LVL3 (GOWN DISPOSABLE) ×2
GOWN STRL REUS W/TWL XL LVL3 (GOWN DISPOSABLE) ×1
K-WIRE DBL .035X4 NSTRL (WIRE) ×1
KWIRE DBL .035X4 NSTRL (WIRE) IMPLANT
NDL HYPO 25X1 1.5 SAFETY (NEEDLE) IMPLANT
NEEDLE HYPO 25X1 1.5 SAFETY (NEEDLE) ×1 IMPLANT
NS IRRIG 1000ML POUR BTL (IV SOLUTION) ×1 IMPLANT
PACK BASIN DAY SURGERY FS (CUSTOM PROCEDURE TRAY) ×1 IMPLANT
PAD CAST 3X4 CTTN HI CHSV (CAST SUPPLIES) IMPLANT
PADDING CAST COTTON 3X4 STRL (CAST SUPPLIES)
SET IRRIG Y TYPE TUR BLADDER L (SET/KITS/TRAYS/PACK) IMPLANT
SLEEVE SCD COMPRESS KNEE MED (STOCKING) IMPLANT
SPLINT FIBERGLASS 4X30 (CAST SUPPLIES) IMPLANT
SPLINT PLASTER CAST XFAST 4X15 (CAST SUPPLIES) ×10 IMPLANT
SUCTION TUBE FRAZIER 10FR DISP (SUCTIONS) ×1 IMPLANT
SUT CHROMIC 6 0 G 1 (SUTURE) IMPLANT
SUT ETHILON 4 0 PS 2 18 (SUTURE) ×1 IMPLANT
SUT MNCRL AB 3-0 PS2 18 (SUTURE) ×1 IMPLANT
SUT VIC AB 4-0 PS2 18 (SUTURE) IMPLANT
SUT VICRYL RAPIDE 4/0 PS 2 (SUTURE) IMPLANT
SYR BULB EAR ULCER 3OZ GRN STR (SYRINGE) IMPLANT
SYR CONTROL 10ML LL (SYRINGE) IMPLANT
TOWEL GREEN STERILE FF (TOWEL DISPOSABLE) ×2 IMPLANT
TUBE CONNECTING 20X1/4 (TUBING) IMPLANT
UNDERPAD 30X36 HEAVY ABSORB (UNDERPADS AND DIAPERS) ×1 IMPLANT

## 2023-01-20 NOTE — Discharge Instructions (Addendum)
Charles Cameron, M.D. Hand Surgery  POST-OPERATIVE DISCHARGE INSTRUCTIONS   PRESCRIPTIONS: You may have been given a prescription to be taken as directed for post-operative pain control.  You may also take over the counter ibuprofen/aleve and tylenol for pain. Take this as directed on the packaging. Do not exceed 3000 mg tylenol/acetaminophen in 24 hours.  Ibuprofen 600-800 mg (3-4) tablets by mouth every 6 hours as needed for pain.  OR Aleve 2 tablets by mouth every 12 hours (twice daily) as needed for pain.  AND/OR Tylenol 1000 mg (2 tablets) every 8 hours as needed for pain.  Please use your pain medication carefully, as refills are limited and you may not be provided with one.  As stated above, please use over the counter pain medicine - it will also be helpful with decreasing your swelling.    ANESTHESIA: After your surgery, post-surgical discomfort or pain is likely. This discomfort can last several days to a few weeks. At certain times of the day your discomfort may be more intense.   Did you receive a nerve block?  A nerve block can provide pain relief for one hour to two days after your surgery. As long as the nerve block is working, you will experience little or no sensation in the area the surgeon operated on.  As the nerve block wears off, you will begin to experience pain or discomfort. It is very important that you begin taking your prescribed pain medication before the nerve block fully wears off. Treating your pain at the first sign of the block wearing off will ensure your pain is better controlled and more tolerable when full-sensation returns. Do not wait until the pain is intolerable, as the medicine will be less effective. It is better to treat pain in advance than to try and catch up.   General Anesthesia:  If you did not receive a nerve block during your surgery, you will need to start taking your pain medication shortly after your surgery and should continue  to do so as prescribed by your surgeon.     ICE AND ELEVATION: You may use ice for the first 48-72 hours, but it is not critical.   Motion of your fingers is very important to decrease the swelling.  Elevation, as much as possible for the next 48 hours, is critical for decreasing swelling as well as for pain relief. Elevation means when you are seated or lying down, you hand should be at or above your heart. When walking, the hand needs to be at or above the level of your elbow.  If the bandage gets too tight, it may need to be loosened. Please contact our office and we will instruct you in how to do this.    SURGICAL BANDAGES:  Keep your dressing and/or splint clean and dry at all times.  Do not remove until you are seen again in the office.  If careful, you may place a plastic bag over your bandage and tape the end to shower, but be careful, do not get your bandages wet.     HAND THERAPY:  You may not need any. If you do, we will begin this at your follow up visit in the clinic.    ACTIVITY AND WORK: You are encouraged to move any fingers which are not in the bandage.  Light use of the fingers is allowed to assist the other hand with daily hygiene and eating, but strong gripping or lifting is often uncomfortable and  should be avoided.  You might miss a variable period of time from work and hopefully this issue has been discussed prior to surgery. You may not do any heavy work with your affected hand for about 2 weeks.    [facility] Second Floor, [address] [place], [place] [address] [phone]    Post Anesthesia Home Care Instructions  Activity: Get plenty of rest for the remainder of the day. A responsible individual must stay with you for 24 hours following the procedure.  For the next 24 hours, DO NOT: -Drive a car -Advertising copywriter -Drink alcoholic beverages -Take any medication unless instructed by your physician -Make any legal decisions or sign  important papers.  Meals: Start with liquid foods such as gelatin or soup. Progress to regular foods as tolerated. Avoid greasy, spicy, heavy foods. If nausea and/or vomiting occur, drink only clear liquids until the nausea and/or vomiting subsides. Call your physician if vomiting continues.  Special Instructions/Symptoms: Your throat may feel dry or sore from the anesthesia or the breathing tube placed in your throat during surgery. If this causes discomfort, gargle with warm salt water. The discomfort should disappear within 24 hours.  If you had a scopolamine patch placed behind your ear for the management of post- operative nausea and/or vomiting:  1. The medication in the patch is effective for 72 hours, after which it should be removed.  Wrap patch in a tissue and discard in the trash. Wash hands thoroughly with soap and water. 2. You may remove the patch earlier than 72 hours if you experience unpleasant side effects which may include dry mouth, dizziness or visual disturbances. 3. Avoid touching the patch. Wash your hands with soap and water after contact with the patch.    No tylenol until after 5:30pm today if needed.

## 2023-01-20 NOTE — Transfer of Care (Signed)
Immediate Anesthesia Transfer of Care Note  Patient: SAQIB CAZAREZ  Procedure(s) Performed: IRRIGATION AND DEBRIDEMENT THUMB WOUND (Right: Thumb) NAILBED REPAIR RIGHT THUMB (Right: Thumb) PERCUTANEOUS PINNING of thumb distal phalanx (Right: Thumb)  Patient Location: PACU  Anesthesia Type:MAC  Level of Consciousness: awake, alert , and oriented  Airway & Oxygen Therapy: Patient Spontanous Breathing  Post-op Assessment: Report given to RN and Post -op Vital signs reviewed and stable  Post vital signs: Reviewed and stable  Last Vitals:  Vitals Value Taken Time  BP    Temp    Pulse    Resp    SpO2      Last Pain:  Vitals:   01/20/23 1124  TempSrc: Temporal  PainSc: 0-No pain         Complications: No notable events documented.

## 2023-01-20 NOTE — Anesthesia Postprocedure Evaluation (Signed)
Anesthesia Post Note  Patient: Charles Cameron  Procedure(s) Performed: IRRIGATION AND DEBRIDEMENT THUMB WOUND (Right: Thumb) NAILBED REPAIR RIGHT THUMB (Right: Thumb) PERCUTANEOUS PINNING of thumb distal phalanx (Right: Thumb)     Patient location during evaluation: Phase II Anesthesia Type: General Level of consciousness: awake and alert Pain management: pain level controlled Vital Signs Assessment: post-procedure vital signs reviewed and stable Respiratory status: spontaneous breathing, nonlabored ventilation, respiratory function stable and patient connected to nasal cannula oxygen Cardiovascular status: stable and blood pressure returned to baseline Postop Assessment: no apparent nausea or vomiting Anesthetic complications: no   No notable events documented.  Last Vitals:  Vitals:   01/20/23 1445 01/20/23 1530  BP: 118/74 133/74  Pulse: 60 (!) 56  Resp: 16 16  Temp: (!) 36.2 C (!) 36.1 C  SpO2: 94% 100%    Last Pain:  Vitals:   01/20/23 1515  TempSrc:   PainSc: 3                  Collene Schlichter

## 2023-01-20 NOTE — Interval H&P Note (Signed)
History and Physical Interval Note:  01/20/2023 12:52 PM  Charles Cameron  has presented today for surgery, with the diagnosis of Right thumb distal phalanx fracture.  The various methods of treatment have been discussed with the patient and family. After consideration of risks, benefits and other options for treatment, the patient has consented to  Procedure(s) with comments: IRRIGATION AND DEBRIDEMENT THUMB WOUND (Right) - regional 75 NAILBED REPAIR (Right) - regional  75 PERCUTANEOUS PINNING of thumb distal phalanx (Right) - regional  75 as a surgical intervention.  The patient's history has been reviewed, patient examined, no change in status, stable for surgery.  I have reviewed the patient's chart and labs.  Questions were answered to the patient's satisfaction.     Daquavion Catala Sladen Plancarte

## 2023-01-20 NOTE — Op Note (Signed)
Date of Surgery: 01/20/2023  INDICATIONS: Patient is a 77 y.o.-year-old male with an injury to the right thumb when a drill press malfunctioned causing the drill bit to be shot into the volar aspect of his right thumb.  He was seen in the ER when the wound was thoroughly irrigated and loosely reapproximated.  X-rays at the time demonstrated a severely comminuted fracture of the distal phalanx.  Subsequently saw him in the office where we reviewed his outside x-rays and discussed the nature of his injury.  We discussed irrigation debridement of the open fracture with repair of the volar soft tissues and the nailbed.  Risks, benefits, and alternatives to surgery were again discussed with the patient in the preoperative area. The patient wishes to proceed with surgery.  Informed consent was signed after our discussion.   PREOPERATIVE DIAGNOSIS:  Severely comminuted, open right thumb distal phalanx fracture Stellate and complex volar right thumb laceration Complex laceration of the nail bed  POSTOPERATIVE DIAGNOSIS: Same.  PROCEDURE:  Irrigation and debridement of open distal phalanx fracture (11012) Repair of complex volar soft tissue laceration, approx 3 cm in length (13132) Avulsion of nail plate (52841) Complex nail bed repair (32440) Open treatment of distal phalanx fracture (10272)   SURGEON: Waylan Rocher, M.D.  ASSIST: None  ANESTHESIA:  Local, MAC  IV FLUIDS AND URINE: See anesthesia.  ESTIMATED BLOOD LOSS: <5 mL.  IMPLANTS: * No implants in log *   DRAINS: None  COMPLICATIONS: None  DESCRIPTION OF PROCEDURE: The patient was met in the preoperative holding area where the surgical site was marked and the informed consent form was signed.  The patient was then brought back to the operating room and remained on the stretcher.  A hand table was placed adjacent to the operative extremity and locked into place.  A tourniquet was placed on the right forearm.  A formal timeout  was performed to confirm that this was the correct patient, surgical side, surgical site, and surgical procedure.  All were present and in agreement. Following formal timeout, a local block was performed using 10 mL of 1% plain lidocaine and 0.25% plain marcaine in equal mixture.  The right upper extremity was then prepped and draped in the usual and sterile fashion.   Following a second formal timeout, the thumb was inspected.  There is a complex, stellate laceration involving the pad of the thumb.  There was abundant clot and hematoma in the wound bed.  This was debrided.  There were sparse Prolene sutures in place.  These were removed.  There was essentially a longitudinal split in the pad of the thumb with maceration of the epidermis.  There was some stellate features at the midportion of this wound.  At this point, I used 1/4 inch Penrose drain was wrapped around the base of the thumb at the MCP joint.  This drain was pulled taut and clamped to serve as a tourniquet.  I began by thorough irrigation and debridement of this wound.  The excisional debridement was sharp using tenotomy scissors and involved the skin and subcutaneous tissue.  Deeper debridement was performed at the fracture site using a small curette.  The wound was irrigated using several liters of normal saline via low flow cystoscopy tubing.  I then repaired the volar laceration in simple fashion using 4-0 Vicryl Rapide suture.  I then turned my attention to the dorsal aspect of the thumb.  It appeared that there was an injury to the ulnar aspect  of the nailbed.  The nail plate was firmly adhered to the underlying nailbed.  The nail plate was avulsed atraumatically using a freer elevator.  Inspection of the nailbed showed a complex laceration that went through the proximal aspect of the nail bed just proximal to the eponychium.  A longitudinal incision was made in line with the nail plate along the ulnar aspect of the nail fold.  Full-thickness  skin flap was elevated to allow access to the proximal nailbed laceration.  The laceration extended along the ulnar border of the nailbed with underlying fracture exposed.  The fracture was thoroughly irrigated from this wound and additional debridement of the fracture site was performed using a small curette taking care to remove any clot but without the lateralizing the fracture fragments.  I then repaired the L-shaped nailbed using a 6-0 chromic suture in simple fashion.  The avulsed nail plate had been debrided of all soft tissue and was resting in a cup of Betadine.  The nail plate was retrieved and rinsed.  It was sutured in place underneath the proximal nail fold to stent the eponychium.  With the soft tissue repairs completed I then brought in the mini fluoroscopy machine to examine the fracture.  The fracture was quite complex with significant comminution of the midportion of the distal phalangeal shaft.  There did appear to be a split in the articular base of the distal phalanx in the coronal plane.  I was concerned that a 0.045 K wire was cause fragmentation of the very distalmost fracture fragment.  I therefore used a 0.035 inch K wire in a retrograde fashion to try to stabilize the DIP joint and allow for consolidation of this severely comminuted distal phalanx fracture.  Appropriate position of the wire was confirmed with AP and lateral views.  The wire was then cut and bent.  The thumb was then dressed with Xeroform, bacitracin ointment, 4 x 4's.  The Penrose tourniquet was removed.  There was some bleeding from the distal wounds.  The thumb was further dressed with folded Kerlix, cast padding, and a well-padded thumb spica splint was applied.  The patient was reversed from sedation.  All counts were correct x 2 at the end of the procedure.  The patient was then taken to the PACU in stable condition.   POSTOPERATIVE PLAN: He will be discharged to home with appropriate discharge instructions and  pain medication.  I will see him back in a week or so for a wound check.  Waylan Rocher, MD 2:42 PM

## 2023-01-20 NOTE — Anesthesia Preprocedure Evaluation (Signed)
Anesthesia Evaluation  Patient identified by MRN, date of birth, ID band Patient awake    Reviewed: Allergy & Precautions, NPO status , Patient's Chart, lab work & pertinent test results  Airway Mallampati: II  TM Distance: >3 FB Neck ROM: Full    Dental  (+) Teeth Intact, Dental Advisory Given   Pulmonary sleep apnea , former smoker   Pulmonary exam normal breath sounds clear to auscultation       Cardiovascular hypertension, Pt. on medications Normal cardiovascular exam+ dysrhythmias (s/p ablation) Atrial Fibrillation  Rhythm:Regular Rate:Normal     Neuro/Psych negative neurological ROS     GI/Hepatic negative GI ROS, Neg liver ROS,,,  Endo/Other  Obesity   Renal/GU negative Renal ROS   Prostate cancer     Musculoskeletal negative musculoskeletal ROS (+)    Abdominal   Peds  Hematology  (+) Blood dyscrasia (Xarelto)   Anesthesia Other Findings Day of surgery medications reviewed with the patient.  Reproductive/Obstetrics                             Anesthesia Physical Anesthesia Plan  ASA: 3  Anesthesia Plan: General   Post-op Pain Management: Tylenol PO (pre-op)*   Induction: Intravenous  PONV Risk Score and Plan: 2 and Dexamethasone and Ondansetron  Airway Management Planned: LMA  Additional Equipment:   Intra-op Plan:   Post-operative Plan: Extubation in OR  Informed Consent: I have reviewed the patients History and Physical, chart, labs and discussed the procedure including the risks, benefits and alternatives for the proposed anesthesia with the patient or authorized representative who has indicated his/her understanding and acceptance.     Dental advisory given  Plan Discussed with: CRNA  Anesthesia Plan Comments:        Anesthesia Quick Evaluation

## 2023-01-20 NOTE — H&P (Signed)
HAND SURGERY   HPI: Patient is a 77 y.o. male who presents with complex injury to the right thumb.  Patient was working on a Database administrator.  The drill bit broke off and was shot into his right thumb.  He was found to have a severely comminuted fracture of the distal phalanx with associated nailbed laceration.  He presents today for surgical management of this injury.  Patient denies any changes to their medical history or new systemic symptoms today.    Past Medical History:  Diagnosis Date   Anticoagulation adequate 07/14/2018   Edema 07/14/2018   Hypertension    OSA (obstructive sleep apnea)    not tolerant of CPAP   Persistent atrial fibrillation (HCC)    Prostate cancer (HCC) 2016   Skin Cancer    URI (upper respiratory infection) 07/14/2018   Past Surgical History:  Procedure Laterality Date   ATRIAL FIBRILLATION ABLATION N/A 04/30/2021   Procedure: ATRIAL FIBRILLATION ABLATION;  Surgeon: Hillis Range, MD;  Location: [facility];  Service: Cardiovascular;  Laterality: N/A;   CYSTOSCOPY WITH RETROGRADE PYELOGRAM, URETEROSCOPY AND STENT PLACEMENT Left 02/02/2022   Procedure: CYSTOSCOPY WITH RETROGRADE PYELOGRAM, URETEROSCOPY AND STENT PLACEMENT;  Surgeon: Despina Arias, MD;  Location: [facility];  Service: Urology;  Laterality: Left;  1.  Left ureteroscopy with laser lithotripsy and basket extraction of stones 2. Cystoscopy  3. Left retrograde pyelogram 4. Left ureteral stent placement (6x26 cm) 5. Fluoroscopy with intraoperative interpretation    LUMBAR LAMINECTOMY  07/14/1987   PROSTATECTOMY     Social History   Socioeconomic History   Marital status: Married    Spouse name: Rinaldo Cloud   Number of children: 5   Years of education: 16   Highest education level: Not on file  Occupational History   Occupation: retired  Tobacco Use   Smoking status: Former    Packs/day: 2.00    Years: 27.00    Additional pack years: 0.00    Total pack years: 54.00    Types:  Cigarettes    Quit date: 07/14/1983    Years since quitting: 39.5    Passive exposure: Past   Smokeless tobacco: Never   Tobacco comments:    Former smoker 05/28/2021  Vaping Use   Vaping Use: Never used  Substance and Sexual Activity   Alcohol use: Yes    Alcohol/week: 4.0 standard drinks of alcohol    Types: 1 Glasses of wine, 1 Cans of beer, 1 Shots of liquor, 1 Standard drinks or equivalent per week    Comment: light use; rarely   Drug use: No   Sexual activity: Not on file  Other Topics Concern   Not on file  Social History Narrative   Patient lives at [place] with spouse.Caffeine Use: 2 cups daily   Retired Acupuncturist   Social Determinants of Corporate investment banker Strain: Not on file  Food Insecurity: Not on file  Transportation Needs: Not on file  Physical Activity: Not on file  Stress: Not on file  Social Connections: Not on file   Family History  Problem Relation Age of Onset   Heart attack Mother    Hypertension Mother    Prostate cancer Father    Hypertension Father    Colon cancer Other    Diabetes Paternal Grandfather    - negative except otherwise stated in the family history section No Known Allergies Prior to Admission medications   Medication Sig Start Date End Date Taking? Authorizing Provider  acetaminophen (TYLENOL) 650 MG CR tablet Take 1,000 mg by mouth every 8 (eight) hours as needed for pain.   Yes [provider]  amLODipine (NORVASC) 5 MG tablet Take 5 mg by mouth every morning. 12/02/21  Yes [provider]  Cyanocobalamin (VITAMIN B-12) 5000 MCG SUBL Place 5,000 mcg under the tongue every morning.   Yes [provider]  ezetimibe (ZETIA) 10 MG tablet Take 10 mg by mouth daily with supper. 08/05/21  Yes [provider]  ibuprofen (ADVIL) 200 MG tablet Take 600 mg by mouth every 6 (six) hours as needed for moderate pain.   Yes [provider]  levocetirizine (XYZAL) 5 MG tablet Take 5  mg by mouth in the morning.   Yes [provider]  loratadine (CLARITIN REDITABS) 10 MG dissolvable tablet Take 10 mg by mouth daily.   Yes [provider]  losartan (COZAAR) 100 MG tablet Take 100 mg by mouth daily. 02/10/22  Yes [provider]  Misc Natural Products (GLUCOSAMINE CHONDROITIN TRIPLE) TABS Take 2 tablets by mouth every morning.   Yes [provider]  Multiple Vitamin (MULTIVITAMIN WITH MINERALS) TABS tablet Take 1 tablet by mouth every morning.   Yes [provider]  oxyCODONE (ROXICODONE) 5 MG immediate release tablet Take 1 tablet (5 mg total) by mouth every 4 (four) hours as needed for up to 15 doses for severe pain. 01/12/23  Yes Sloan Leiter, DO  rivaroxaban (XARELTO) 20 MG TABS tablet TAKE 1 TABLET BY MOUTH DAILY WITH SUPPER. 03/31/22  Yes Allred, Fayrene Fearing, MD   No results found. - Positive ROS: All other systems have been reviewed and were otherwise negative with the exception of those mentioned in the HPI and as above.  Physical Exam: General: No acute distress, resting comfortably Cardiovascular: BUE warm and well perfused, normal rate Respiratory: Normal WOB on RA Skin: Warm and dry Neurologic: Sensation intact distally Psychiatric: Patient is at baseline mood and affect  Right Upper Extremity  Dressing is clean and dry.  He has full AROM of remaining fingers.  All fingers are warm and well-perfused.  Assessment: 77 year old male with open injury to the right thumb with comminuted distal phalanx fracture and complex nail bed laceration.   Plan: OR today for I&D of the right thumb with nail bed repair and possible percutaneous pinning of the distal phalanx. We again reviewed the risks,  benefits, and alternatives to surgery.  Informed consent was signed.  All questions were answered.   Marlyne Beards, M.D. [facility] 12:48 PM

## 2023-01-21 ENCOUNTER — Encounter (HOSPITAL_BASED_OUTPATIENT_CLINIC_OR_DEPARTMENT_OTHER): Payer: Self-pay | Admitting: Orthopedic Surgery

## 2023-04-22 ENCOUNTER — Telehealth: Payer: Self-pay | Admitting: Cardiology

## 2023-04-22 ENCOUNTER — Other Ambulatory Visit: Payer: Self-pay | Admitting: Medical Genetics

## 2023-04-22 DIAGNOSIS — Z006 Encounter for examination for normal comparison and control in clinical research program: Secondary | ICD-10-CM

## 2023-04-22 MED ORDER — RIVAROXABAN 20 MG PO TABS: 20.0000 mg | ORAL_TABLET | Freq: Every day | ORAL | 1 refills | Status: DC

## 2023-04-22 NOTE — Telephone Encounter (Signed)
Pt has scheduled appt with Dr Cristal Deer on 07/27/23. Refill sent to prevent any missed doses.

## 2023-04-22 NOTE — Telephone Encounter (Signed)
Prescription refill request for Xarelto received.  Indication: Afib  Last office visit: 03/30/22 Cristal Deer)  Weight: 101.7kg Age: 77 Scr: 0.97 (02/09/23)  CrCl: 90.2ml/min  Office visit overdue. Called and spoke with pt. Transferred pt to scheduling.

## 2023-04-22 NOTE — Telephone Encounter (Signed)
*  STAT* If patient is at the pharmacy, call can be transferred to refill team.   1. Which medications need to be refilled? (please list name of each medication and dose if known) rivaroxaban (XARELTO) 20 MG TABS tablet    2. Would you like to learn more about the convenience, safety, & potential cost savings by using the [facility]? No      3. Are you open to using the [facility] (Type [facility]. No ).   4. Which pharmacy/location (including street and city if local pharmacy) is medication to be sent to? [id number] - [place], [place] - [address]    5. Do they need a 30 day or 90 day supply? 90

## 2023-04-22 NOTE — Telephone Encounter (Signed)
Xarelto refill

## 2023-06-29 ENCOUNTER — Ambulatory Visit (HOSPITAL_BASED_OUTPATIENT_CLINIC_OR_DEPARTMENT_OTHER): Payer: Medicare Other | Admitting: Cardiology

## 2023-06-29 ENCOUNTER — Encounter (HOSPITAL_BASED_OUTPATIENT_CLINIC_OR_DEPARTMENT_OTHER): Payer: Self-pay | Admitting: Cardiology

## 2023-06-29 VITALS — BP 140/68 | HR 83 | Ht 70.0 in | Wt 234.0 lb

## 2023-06-29 DIAGNOSIS — Z7901 Long term (current) use of anticoagulants: Secondary | ICD-10-CM

## 2023-06-29 DIAGNOSIS — D6869 Other thrombophilia: Secondary | ICD-10-CM | POA: Diagnosis not present

## 2023-06-29 DIAGNOSIS — Z8679 Personal history of other diseases of the circulatory system: Secondary | ICD-10-CM | POA: Diagnosis not present

## 2023-06-29 DIAGNOSIS — I480 Paroxysmal atrial fibrillation: Secondary | ICD-10-CM

## 2023-06-29 DIAGNOSIS — I2510 Atherosclerotic heart disease of native coronary artery without angina pectoris: Secondary | ICD-10-CM

## 2023-06-29 DIAGNOSIS — Z7189 Other specified counseling: Secondary | ICD-10-CM

## 2023-06-29 DIAGNOSIS — I10 Essential (primary) hypertension: Secondary | ICD-10-CM

## 2023-06-29 NOTE — Progress Notes (Signed)
  Cardiology Office Note:  .   Date:  06/29/2023  ID:  Charles Cameron, DOB 01-03-1946, MRN [id number] PCP: Randel Pigg, Dorma Russell, MD  [facility] Providers Cardiologist:  Jodelle Red, MD {  History of Present Illness: Charles Cameron is a 77 y.o. male with a hx of pneumonia 06/2018-07/2018 with atrial flutter with paroxysmal atrial fibrillation diagnosed at the same time, coronary artery calcification, hypertension. He is seen for follow up today.   Cardiac history: No prior history until 06/2018, when he developed URI and atrial flutter/atrial fibrillation. He had intermittent RVR while in the hospital, requiring multiple medications for management. He was initially on dronedarone (stopped after 30 days), diltiazem, and metoprolol. Had volume overload with arrhythmia, improved with lasix. EF was normal. Afib has recurred, underwent successful ablation 04/2021.  Today: Just returned from a trip in [place] last week. Has some swelling with traveling, started lasix yesterday. Still having some cramping in the calves. Had a URI, treated with zpak, getting better.  Has not had any afib/flutter. No further hematuria. Had thumb injury, required reconstructive surgery in July.  ROS: Denies chest pain, shortness of breath at rest or with normal exertion. No PND, orthopnea, LE edema or unexpected weight gain. No syncope or palpitations. ROS otherwise negative except as noted.   Studies Reviewed: Marland Kitchen    EKG:       Physical Exam:   VS:  BP (!) 140/68 (BP Location: Left Arm, Patient Position: Sitting, Cuff Size: Normal)   Pulse 83   Ht 5\' 10"  (1.778 m)   Wt 234 lb (106.1 kg)   SpO2 95%   BMI 33.58 kg/m    Wt Readings from Last 3 Encounters:  06/29/23 234 lb (106.1 kg)  01/20/23 224 lb 3.3 oz (101.7 kg)  01/12/23 220 lb (99.8 kg)    GEN: Well nourished, well developed in no acute distress HEENT: Normal, moist mucous membranes NECK: No JVD CARDIAC: regular rhythm, normal  S1 and S2, no rubs or gallops. No murmur. VASCULAR: Radial and DP pulses 2+ bilaterally. No carotid bruits RESPIRATORY:  Clear to auscultation without rales, wheezing or rhonchi  ABDOMEN: Soft, non-tender, non-distended MUSCULOSKELETAL:  Ambulates independently SKIN: Warm and dry, mild bilateral LE edema R>L, no warmth, erythema, pain NEUROLOGIC:  Alert and oriented x 3. No focal neuro deficits noted. PSYCHIATRIC:  Normal affect    ASSESSMENT AND PLAN: .    History of paroxysmal Atrial flutter/paroxysmal atrial fibrillation -s/p ablation 04/2021 -EF 70-75%, moderate MR, concern for severe LA dilation -CHA2DS2/VAS Stroke Risk Points= 3, on anticoagulation for secondary hypercoagulable state     Hypertension -typically well controlled, slightly elevated today -continue losartan, amlodipine  Coronary artery calcification -Ca score 297 (52nd percentile) on CT pulm vein scan -on ezetimibe -no aspirin as he is on DOAC  CV risk counseling and prevention -recommend heart healthy/Mediterranean diet, with whole grains, fruits, vegetable, fish, lean meats, nuts, and olive oil. Limit salt. -recommend moderate walking, 3-5 times/week for 30-50 minutes each session. Aim for at least 150 minutes.week. Goal should be pace of 3 miles/hours, or walking 1.5 miles in 30 minutes -recommend avoidance of tobacco products. Avoid excess alcohol.  Dispo: 1 year or sooner as needed  Signed, Jodelle Red, MD   Jodelle Red, MD, PhD, [facility]  [facility]  [place]  [facility] at [facility] [address] [place], [place] [address] [phone]

## 2023-06-29 NOTE — Patient Instructions (Signed)

## 2023-07-27 ENCOUNTER — Ambulatory Visit (HOSPITAL_BASED_OUTPATIENT_CLINIC_OR_DEPARTMENT_OTHER): Payer: Medicare Other | Admitting: Cardiology

## 2023-08-16 ENCOUNTER — Encounter (HOSPITAL_BASED_OUTPATIENT_CLINIC_OR_DEPARTMENT_OTHER): Payer: Self-pay

## 2023-09-11 ENCOUNTER — Emergency Department (HOSPITAL_COMMUNITY)

## 2023-09-11 ENCOUNTER — Emergency Department (HOSPITAL_COMMUNITY)
Admission: EM | Admit: 2023-09-11 | Discharge: 2023-09-11 | Disposition: A | Discharge status: Home / Self Care | Attending: Emergency Medicine | Admitting: Emergency Medicine

## 2023-09-11 DIAGNOSIS — S43015A Anterior dislocation of left humerus, initial encounter: Secondary | ICD-10-CM | POA: Diagnosis not present

## 2023-09-11 DIAGNOSIS — S42252A Displaced fracture of greater tuberosity of left humerus, initial encounter for closed fracture: Secondary | ICD-10-CM | POA: Insufficient documentation

## 2023-09-11 DIAGNOSIS — I4891 Unspecified atrial fibrillation: Secondary | ICD-10-CM | POA: Insufficient documentation

## 2023-09-11 DIAGNOSIS — W19XXXA Unspecified fall, initial encounter: Secondary | ICD-10-CM | POA: Insufficient documentation

## 2023-09-11 DIAGNOSIS — S43005A Unspecified dislocation of left shoulder joint, initial encounter: Secondary | ICD-10-CM

## 2023-09-11 DIAGNOSIS — M7989 Other specified soft tissue disorders: Secondary | ICD-10-CM | POA: Diagnosis not present

## 2023-09-11 DIAGNOSIS — Z7901 Long term (current) use of anticoagulants: Secondary | ICD-10-CM | POA: Insufficient documentation

## 2023-09-11 DIAGNOSIS — I10 Essential (primary) hypertension: Secondary | ICD-10-CM | POA: Diagnosis not present

## 2023-09-11 DIAGNOSIS — S4992XA Unspecified injury of left shoulder and upper arm, initial encounter: Secondary | ICD-10-CM | POA: Diagnosis present

## 2023-09-11 DIAGNOSIS — Z79899 Other long term (current) drug therapy: Secondary | ICD-10-CM | POA: Insufficient documentation

## 2023-09-11 MED ORDER — HYDROMORPHONE HCL 1 MG/ML IJ SOLN
1.0000 mg | Freq: Once | INTRAMUSCULAR | Status: AC
  Administered 2023-09-11: 1 mg via INTRAVENOUS
  Filled 2023-09-11: qty 1

## 2023-09-11 MED ORDER — HYDROCODONE-ACETAMINOPHEN 5-325 MG PO TABS: 2.0000 | ORAL_TABLET | ORAL | 0 refills | Status: DC | PRN

## 2023-09-11 MED ORDER — LIDOCAINE HCL (PF) 1 % IJ SOLN
30.0000 mL | Freq: Once | INTRAMUSCULAR | Status: AC
  Administered 2023-09-11: 30 mL

## 2023-09-11 MED ORDER — ONDANSETRON HCL 4 MG/2ML IJ SOLN
4.0000 mg | Freq: Once | INTRAMUSCULAR | Status: AC
  Administered 2023-09-11: 4 mg via INTRAVENOUS
  Filled 2023-09-11: qty 2

## 2023-09-11 MED ORDER — FENTANYL CITRATE PF 50 MCG/ML IJ SOSY
50.0000 ug | PREFILLED_SYRINGE | Freq: Once | INTRAMUSCULAR | Status: AC
  Administered 2023-09-11: 50 ug via INTRAMUSCULAR
  Filled 2023-09-11: qty 1

## 2023-09-11 MED ORDER — PROPOFOL 10 MG/ML IV BOLUS
0.5000 mg/kg | Freq: Once | INTRAVENOUS | Status: AC
  Administered 2023-09-11: 53.3 mg via INTRAVENOUS
  Filled 2023-09-11: qty 20

## 2023-09-11 MED ORDER — FENTANYL CITRATE PF 50 MCG/ML IJ SOSY
PREFILLED_SYRINGE | INTRAMUSCULAR | Status: AC
  Filled 2023-09-11: qty 1

## 2023-09-11 MED ORDER — HYDROMORPHONE HCL 1 MG/ML IJ SOLN: 1.0000 mg | Freq: Once | INTRAMUSCULAR | Status: DC

## 2023-09-11 MED ORDER — KETAMINE HCL 50 MG/5ML IJ SOSY
0.5000 mg/kg | PREFILLED_SYRINGE | Freq: Once | INTRAMUSCULAR | Status: AC
  Administered 2023-09-11: 53 mg via INTRAVENOUS
  Filled 2023-09-11: qty 10

## 2023-09-11 MED ORDER — LIDOCAINE HCL 1 % IJ SOLN
INTRAMUSCULAR | Status: AC
Start: 2023-09-11 — End: 2023-09-11
  Administered 2023-09-11: 20 mL
  Filled 2023-09-11: qty 20

## 2023-09-11 MED ORDER — PROPOFOL 10 MG/ML IV BOLUS
1.0000 mg/kg | Freq: Once | INTRAVENOUS | Status: AC
  Administered 2023-09-11: 106.6 mg via INTRAVENOUS
  Filled 2023-09-11: qty 20

## 2023-09-11 NOTE — Progress Notes (Signed)
 Orthopedic Tech Progress Note Patient Details:  Charles Cameron Jan 27, 1946 [id number]  Ortho Devices Type of Ortho Device: Shoulder immobilizer Ortho Device/Splint Location: LUE Ortho Device/Splint Interventions: Ordered, Application, Adjustment   Post Interventions Patient Tolerated: Well Instructions Provided: Adjustment of device  Tonye Pearson 09/11/2023, 6:30 PM

## 2023-09-11 NOTE — Progress Notes (Signed)
 Respiratory Therapist at [facility]  in room number _19___ during procedure.  Suction with Yaunker at [facility] set up and ready to use. Ambu bag at [facility] and ready to use.  Patient placed on ETCO2 Nasal Cannula at _6___ LPM.  Vitals at conclusion of procedure:  ETCO2 27____ mmHg HR71 RR19 SPO2 98  Patient awake and able to verbalize name.

## 2023-09-11 NOTE — ED Notes (Signed)
 Patient is loudly verbalizing pain and using profanity. This nurse apologized for delays and has notified providers.  Patient continues to yell profanity loudly in ED and verbally attacking staff due to pain.

## 2023-09-11 NOTE — ED Provider Notes (Signed)
 Charles Cameron Provider Note   CSN: [id number] Arrival date & time: 09/11/23  1206     History  Chief Complaint  Patient presents with   left arm pain   Fall    Charles Cameron is a 78 y.o. male with history of hypertension, persistent A-fib on blood thinning medication.  Patient presents to ED for evaluation of fall.  States that he was on his deck when he tripped in a hole landing on his left shoulder.  He is here complaining of 10 out of 10 pain in his left shoulder, left upper arm.  Denies hitting his head during this event.  Denies headache, neck pain.  Denies surgical history to left shoulder.  Denies preceding chest pain or shortness of breath prior to fall.   Fall       Home Medications Prior to Admission medications   Medication Sig Start Date End Date Taking? Authorizing Provider  HYDROcodone-acetaminophen (NORCO/VICODIN) 5-325 MG tablet Take 2 tablets by mouth every 4 (four) hours as needed. 09/11/23  Yes Al Decant, PA-C  acetaminophen (TYLENOL) 650 MG CR tablet Take 1,000 mg by mouth every 8 (eight) hours as needed for pain.    [provider]  amLODipine (NORVASC) 5 MG tablet Take 5 mg by mouth every morning. 12/02/21   [provider]  Cyanocobalamin (VITAMIN B-12) 5000 MCG SUBL Place 5,000 mcg under the tongue every morning.    [provider]  ezetimibe (ZETIA) 10 MG tablet Take 10 mg by mouth daily with supper. 08/05/21   [provider]  ibuprofen (ADVIL) 200 MG tablet Take 600 mg by mouth every 6 (six) hours as needed for moderate pain.    [provider]  levocetirizine (XYZAL) 5 MG tablet Take 5 mg by mouth in the morning.    [provider]  loratadine (CLARITIN REDITABS) 10 MG dissolvable tablet Take 10 mg by mouth daily.    [provider]  losartan (COZAAR) 100 MG tablet Take 100 mg by mouth daily. 02/10/22   [provider]  Misc  Natural Products (GLUCOSAMINE CHONDROITIN TRIPLE) TABS Take 2 tablets by mouth every morning.    [provider]  Multiple Vitamin (MULTIVITAMIN WITH MINERALS) TABS tablet Take 1 tablet by mouth every morning.    [provider]  rivaroxaban (XARELTO) 20 MG TABS tablet Take 1 tablet (20 mg total) by mouth daily with supper. 04/22/23   Jodelle Red, MD      Allergies    Patient has no known allergies.    Review of Systems   Review of Systems  Musculoskeletal:  Positive for arthralgias and myalgias.  All other systems reviewed and are negative.   Physical Exam Updated Vital Signs BP 130/85   Pulse 93   Temp (!) 97.4 F (36.3 C) (Oral)   Resp (!) 23   Ht 5\' 10"  (1.778 m)   Wt 106.6 kg   SpO2 98%   BMI 33.72 kg/m  Physical Exam Vitals and nursing note reviewed.  Constitutional:      General: He is not in acute distress.    Appearance: He is well-developed.  HENT:     Head: Normocephalic and atraumatic.  Eyes:     Conjunctiva/sclera: Conjunctivae normal.  Cardiovascular:     Rate and Rhythm: Normal rate and regular rhythm.     Heart sounds: No murmur heard. Pulmonary:     Effort: Pulmonary effort is normal. No respiratory distress.  Breath sounds: Normal breath sounds.  Abdominal:     Palpations: Abdomen is soft.     Tenderness: There is no abdominal tenderness.  Musculoskeletal:        General: No swelling.     Cervical back: Neck supple.     Comments: Obviously deformed and dislocated left shoulder.  Skin:    General: Skin is warm and dry.     Capillary Refill: Capillary refill takes less than 2 seconds.  Neurological:     General: No focal deficit present.     Mental Status: He is alert.     GCS: GCS eye subscore is 4. GCS verbal subscore is 5. GCS motor subscore is 6.     Cranial Nerves: Cranial nerves 2-12 are intact. No cranial nerve deficit.     Sensory: Sensation is intact. No sensory deficit.     Motor: Motor function is  intact. No weakness.     Coordination: Coordination is intact. Romberg sign negative.  Psychiatric:        Mood and Affect: Mood normal.     ED Results / Procedures / Treatments   Labs (all labs ordered are listed, but only abnormal results are displayed) Labs Reviewed - No data to display  EKG None  Radiology DG Shoulder Left Portable Result Date: 09/11/2023 CLINICAL DATA:  Status post attempted reduction EXAM: LEFT SHOULDER COMPARISON:  09/11/2023 left shoulder radiographs FINDINGS: Persistent anterior left shoulder dislocation. Fracture fragment originating from the region of the greater tuberosity of the humeral head is again seen. IMPRESSION: Persistent left shoulder dislocation. Electronically Signed   By: Acquanetta Belling M.D.   On: 09/11/2023 17:00   DG Shoulder Left Result Date: 09/11/2023 CLINICAL DATA:  Pain after fall. EXAM: LEFT SHOULDER - 2+ VIEW; LEFT HUMERUS - 2+ VIEW COMPARISON:  None Available. FINDINGS: Shoulder: The humerus is displaced anteriorly with respect to the glenoid. Suspected displaced fracture fragment arising from the lateral humeral head. The acromioclavicular joint is congruent with moderate degenerative spurring. Humerus: The distal humerus is intact. Elbow alignment is suboptimally assessed on provided views but grossly congruent. IMPRESSION: Anterior shoulder dislocation with a displaced fracture fragment arising from the lateral humeral head. Electronically Signed   By: Narda Rutherford M.D.   On: 09/11/2023 14:44   DG Humerus Left Result Date: 09/11/2023 CLINICAL DATA:  Pain after fall. EXAM: LEFT SHOULDER - 2+ VIEW; LEFT HUMERUS - 2+ VIEW COMPARISON:  None Available. FINDINGS: Shoulder: The humerus is displaced anteriorly with respect to the glenoid. Suspected displaced fracture fragment arising from the lateral humeral head. The acromioclavicular joint is congruent with moderate degenerative spurring. Humerus: The distal humerus is intact. Elbow alignment is  suboptimally assessed on provided views but grossly congruent. IMPRESSION: Anterior shoulder dislocation with a displaced fracture fragment arising from the lateral humeral head. Electronically Signed   By: Narda Rutherford M.D.   On: 09/11/2023 14:44    Procedures .Reduction of dislocation  Date/Time: 09/11/2023 7:07 PM  Performed by: Al Decant, PA-C Authorized by: Al Decant, PA-C  Consent: Verbal consent obtained. Written consent obtained. Consent given by: patient Patient identity confirmed: verbally with patient Time out: Immediately prior to procedure a "time out" was called to verify the correct patient, procedure, equipment, support staff and site/side marked as required. Preparation: Patient was prepped and draped in the usual sterile fashion.  Sedation: Patient sedated: yes Sedation type: moderate (conscious) sedation Sedatives: ketamine and propofol Analgesia: ketamine  Patient tolerance: patient tolerated the procedure well  with no immediate complications      Medications Ordered in ED Medications  fentaNYL (SUBLIMAZE) injection 50 mcg (50 mcg Intramuscular Given 09/11/23 1337)  HYDROmorphone (DILAUDID) injection 1 mg (1 mg Intravenous Given 09/11/23 1405)  ondansetron (ZOFRAN) injection 4 mg (4 mg Intravenous Given 09/11/23 1413)  propofol (DIPRIVAN) 10 mg/mL bolus/IV push 53.3 mg (53.3 mg Intravenous Given 09/11/23 1529)  ketamine 50 mg in normal saline 5 mL (10 mg/mL) syringe (53 mg Intravenous Given 09/11/23 1530)  HYDROmorphone (DILAUDID) injection 1 mg (1 mg Intravenous Given 09/11/23 1631)  propofol (DIPRIVAN) 10 mg/mL bolus/IV push 106.6 mg (106.6 mg Intravenous Given 09/11/23 1802)  lidocaine (PF) (XYLOCAINE) 1 % injection 30 mL (30 mLs Other Given 09/11/23 1819)  lidocaine (XYLOCAINE) 1 % (with pres) injection (20 mLs  Given 09/11/23 1811)    ED Course/ Medical Decision Making/ A&P Clinical Course as of 09/11/23 1912  Sat Sep 11, 2023  1557 Attempted  bedside reduction of shoulder, unsuccessful on exam, pt awake from sedation [MT]  1703 Page out to ortho surgeon [MT]  1748 Dr Linna Caprice coming in to assist with reduction - RT and RN and ortho tech made aware.  Pt and brother in law in room updated as well [MT]  1829 Successful reduction with dr swinteck - needing post reduction CT per ortho surgeon - pt awake now, in sling [MT]    Clinical Course User Index [MT] Trifan, Kermit Balo, MD   Medical Decision Making Amount and/or Complexity of Data Reviewed Radiology: ordered.  Risk Prescription drug management.   78 year old male presents for evaluation of left shoulder injury.  Please see HPI for further details.  On exam the patient left shoulder is obviously dislocated.  He is afebrile and nontachycardic.  His lung sounds are clear bilaterally, he is not hypoxic.  His abdomen is soft and compressible.  Neurological examinations at baseline.  Will treat patient pain and collect imaging of left shoulder and left humerus.  Provided with fentanyl and Dilaudid for pain.  X-ray imaging of left shoulder shows anterior dislocation.  My attending and myself attempted to reduce this however unsuccessful.  Did need to call and on-call orthopedic provider Dr. Linna Caprice.  Dr. Ernestine Conrad to go successful in reducing the patient shoulder.  Postreduction film shows successful reduction.  Dr. Linna Caprice requested that we CT scan the patient's left shoulder prior to discharge.  This was completed.  Patient was placed into a shoulder sling and he will follow-up with Dr. Linna Caprice in the office.  Will send home with pain medication.  Return precautions provided and he voiced understanding.  Stable to discharge home.   Final Clinical Impression(s) / ED Diagnoses Final diagnoses:  Dislocation of left shoulder joint, initial encounter    Rx / DC Orders ED Discharge Orders          Ordered    HYDROcodone-acetaminophen (NORCO/VICODIN) 5-325 MG tablet  Every 4 hours PRN         09/11/23 1912              Clent Ridges 09/11/23 1912    Terald Sleeper, MD 09/11/23 2002

## 2023-09-11 NOTE — ED Provider Notes (Signed)
 [facility]  Date/Time: 09/11/2023 8:02 PM  Performed by: Terald Sleeper, MD Authorized by: Terald Sleeper, MD   Consent:    Consent obtained:  Verbal   Consent given by:  Patient   Risks discussed:  Fracture, nerve damage, recurrent dislocation and restricted joint movement   Alternatives discussed:  No treatmentPre-procedure neurovascular assessment: neurovascularly intact Pre-procedure distal perfusion: normal Pre-procedure neurological function: normal Pre-procedure range of motion: reduced  Anesthesia: Local anesthesia used: no  Patient sedated: Yes. Refer to sedation procedure documentation for details of sedation. Immobilization: sling Splint Applied by: Milon Dikes Post-procedure neurovascular assessment: post-procedure neurovascularly intact Post-procedure distal perfusion: normal Post-procedure neurological function: normal Post-procedure range of motion: normal   .Sedation  Date/Time: 09/11/2023 8:04 PM  Performed by: Terald Sleeper, MD Authorized by: Terald Sleeper, MD   Consent:    Consent obtained:  Verbal   Consent given by:  Patient   Risks discussed:  Prolonged hypoxia resulting in organ damage, prolonged sedation necessitating reversal, respiratory compromise necessitating ventilatory assistance and intubation, vomiting, dysrhythmia, allergic reaction, inadequate sedation and nausea   Alternatives discussed:  Analgesia without sedation and regional anesthesia Universal protocol:    Procedure explained and questions answered to patient or proxy's satisfaction: yes     Relevant documents present and verified: yes     Test results available: yes     Imaging studies available: yes     Required blood products, implants, devices, and special equipment available: yes     Site/side marked: yes     Immediately prior to procedure, a time out was called: yes     Patient identity confirmed:  Arm band Indications:    Procedure performed:   Dislocation reduction Pre-sedation assessment:    Time since last food or drink:  8 hour   ASA classification: class 3 - patient with severe systemic disease     Mallampati score:  II - soft palate, uvula, fauces visible   Neck mobility: normal     Pre-sedation assessments completed and reviewed: airway patency, cardiovascular function, hydration status, mental status, nausea/vomiting, pain level, respiratory function and temperature     History of difficult intubation: yes   A pre-sedation assessment was completed prior to the start of the procedure Immediate pre-procedure details:    Reassessment: Patient reassessed immediately prior to procedure     Reviewed: vital signs, relevant labs/tests and NPO status     Verified: bag valve mask available, emergency equipment available, intubation equipment available, IV patency confirmed, oxygen available and reversal medications available   Procedure details (see MAR for exact dosages):    Preoxygenation:  Nasal cannula and bag valve mask   Sedation:  Propofol and ketamine   Intended level of sedation: deep   Analgesia:  None   Intra-procedure monitoring:  Cardiac monitor, blood pressure monitoring, continuous pulse oximetry, continuous capnometry, frequent LOC assessments and frequent vital sign checks   Intra-procedure events: hypoxia     Intra-procedure management:  Airway repositioning and BVM ventilation   Total Provider sedation time (minutes):  40 Post-procedure details:   A post-sedation assessment was completed following the completion of the procedure.   Attendance: Constant attendance by certified staff until patient recovered     Recovery: Patient returned to pre-procedure baseline     Post-sedation assessments completed and reviewed: airway patency, cardiovascular function, hydration status, mental status, nausea/vomiting, pain level, respiratory function and temperature     Patient is stable for discharge or admission: yes  Procedure completion:  Tolerated well, no immediate complications Comments:     Initial sedation for unsuccessful ED provider bedside reduction .Sedation  Date/Time: 09/11/2023 8:05 PM  Performed by: Terald Sleeper, MD Authorized by: Terald Sleeper, MD   Consent:    Consent obtained:  Verbal   Consent given by:  Patient   Risks discussed:  Allergic reaction, dysrhythmia, inadequate sedation, nausea, vomiting, respiratory compromise necessitating ventilatory assistance and intubation, prolonged hypoxia resulting in organ damage and prolonged sedation necessitating reversal   Alternatives discussed:  Analgesia without sedation and regional anesthesia Universal protocol:    Procedure explained and questions answered to patient or proxy's satisfaction: yes     Relevant documents present and verified: yes     Test results available: yes     Imaging studies available: yes     Required blood products, implants, devices, and special equipment available: yes     Site/side marked: yes     Immediately prior to procedure, a time out was called: yes     Patient identity confirmed:  Arm band Indications:    Procedure performed:  Dislocation reduction   Procedure necessitating sedation performed by:  Different physician Pre-sedation assessment:    Time since last food or drink:  8 hour   ASA classification: class 2 - patient with mild systemic disease     Mallampati score:  II - soft palate, uvula, fauces visible   Neck mobility: normal     Pre-sedation assessments completed and reviewed: airway patency, cardiovascular function, hydration status, mental status, nausea/vomiting, pain level, respiratory function and temperature     History of difficult intubation: yes   A pre-sedation assessment was completed prior to the start of the procedure Immediate pre-procedure details:    Reassessment: Patient reassessed immediately prior to procedure     Reviewed: vital signs     Verified: bag valve  mask available, emergency equipment available, intubation equipment available, IV patency confirmed, oxygen available and reversal medications available   Procedure details (see MAR for exact dosages):    Preoxygenation:  Nasal cannula   Sedation:  Propofol   Intended level of sedation: deep   Intra-procedure monitoring:  Continuous pulse oximetry, cardiac monitor, blood pressure monitoring, continuous capnometry, frequent LOC assessments and frequent vital sign checks   Intra-procedure events: hypoxia     Intra-procedure management:  Airway repositioning   Total Provider sedation time (minutes):  30 Post-procedure details:   A post-sedation assessment was completed following the completion of the procedure.   Attendance: Constant attendance by certified staff until patient recovered     Recovery: Patient returned to pre-procedure baseline     Post-sedation assessments completed and reviewed: airway patency, cardiovascular function, hydration status, mental status, nausea/vomiting, pain level, respiratory function and temperature     Patient is stable for discharge or admission: yes     Procedure completion:  Tolerated well, no immediate complications Comments:     2nd sedation with propofol for orthopedic surgeon's bedside reduction of dislocation (Dr Linna Caprice performing reduction)     Terald Sleeper, MD 09/11/23 2007

## 2023-09-11 NOTE — ED Notes (Signed)
 Radiology notified at this time that pain interventions completed , advised to attempt imagining in 15-20 mins

## 2023-09-11 NOTE — Discharge Instructions (Addendum)
 It was a pleasure taking part in your care.  As we discussed, you will need to follow-up with Dr. Linna Caprice in the office.  Please call his office and make an appointment to be seen.  Please take pain medication as prescribed sent to your pharmacy on file.  You may start with ibuprofen and Tylenol and to see if this helps your pain and if not, please proceed with hydrocodone pain medication 1 tablet.  Please remain in shoulder sling until seen by Dr. Linna Caprice.  Return to ED with any new or worsening symptoms.

## 2023-09-11 NOTE — Progress Notes (Signed)
 Respiratory Therapist at [facility]  in room number _19___ during procedure.  Suction with Yaunker at [facility] set up and ready to use. Ambu bag at [facility] [address] and ready to use.  Patient placed on ETCO2 Nasal Cannula at _2___ LPM.  Vitals at conclusion of procedure:  ETCO2 _28___ mmHg HR91 RR 19 SPO2 98  Patient awake and able to verbalize name.

## 2023-09-11 NOTE — ED Triage Notes (Signed)
 Patient c/o falling about 1 hour ago, Landed on left shoulder Pain 10/10 Says pain is left shoulder and left upper arm  Denies hitting head Denies LOC  Patient endorses being on anticoags

## 2023-09-29 ENCOUNTER — Other Ambulatory Visit: Payer: Self-pay | Admitting: Cardiology

## 2023-09-29 DIAGNOSIS — I480 Paroxysmal atrial fibrillation: Secondary | ICD-10-CM

## 2023-09-29 NOTE — Telephone Encounter (Signed)
 Prescription refill request for Xarelto received.  Indication: Afib  Last office visit: 06/29/23 Cristal Deer)  Weight: 106.6kg Age: 78 Scr: 1.17 (07/26/23)  CrCl: 77.18ml/min  Appropriate dose. Refill sent.

## 2023-10-13 IMAGING — CT CT HEAD W/O CM
3 series · 15 of 47 positions shown, 18 images · non-contrast
Comparison: None.

CLINICAL DATA: Altered mental status.



[Series 2: head wo · axial · 0.42mm/px · z∈[+959,+1094]mm · 9 of 33 slices shown, 12 images]
[im 3/33  brain]
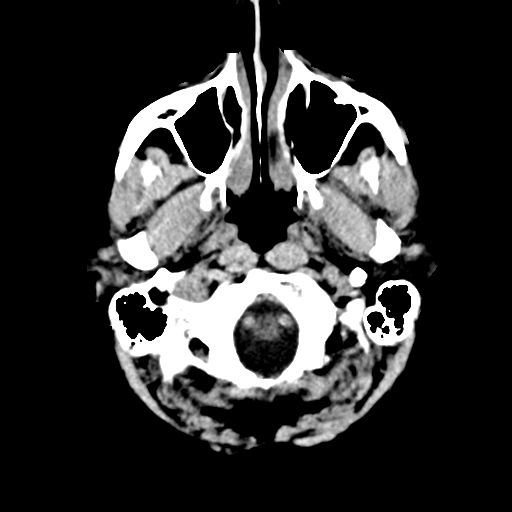
[im 3/33  bone]
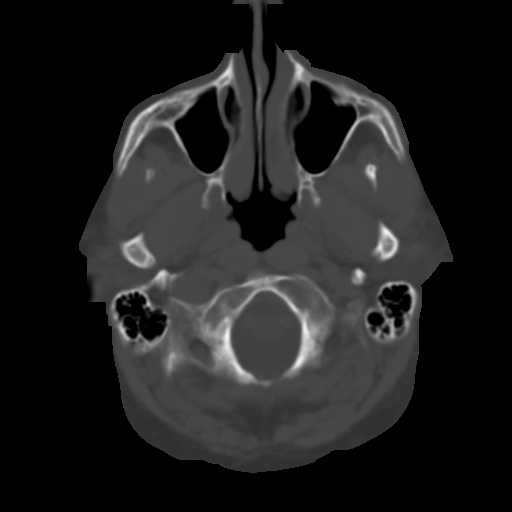
[im 6/33  brain]
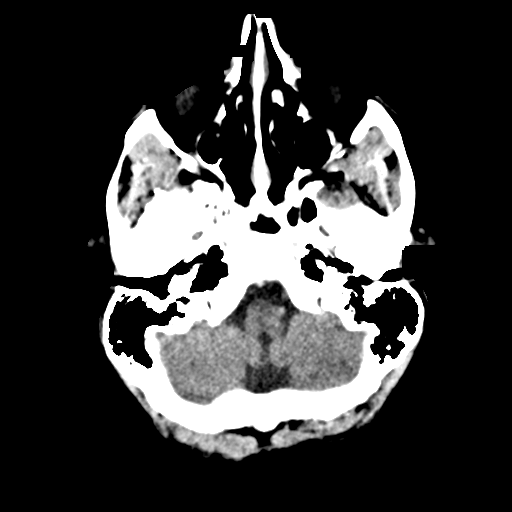
[im 9/33  brain]
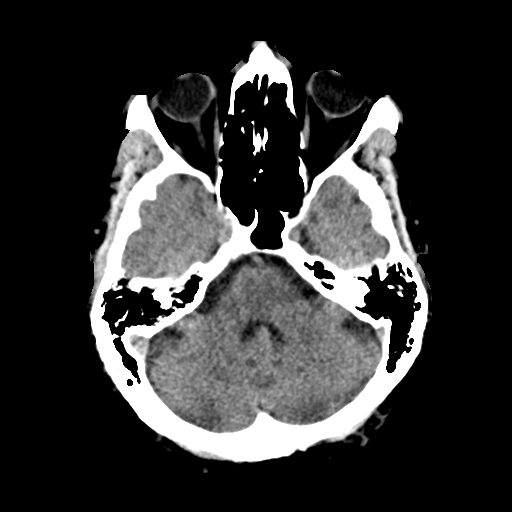
[im 13/33  brain]
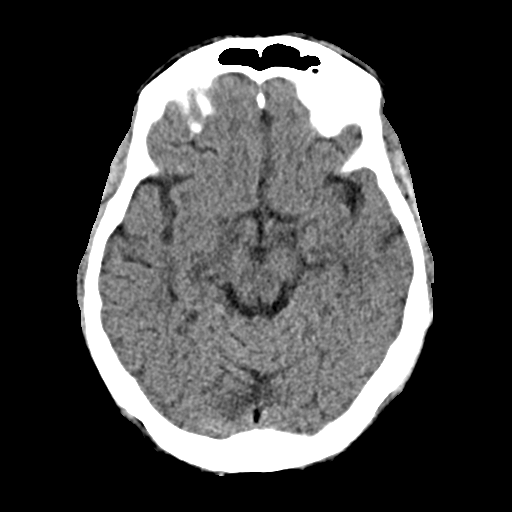
[im 17/33  brain]
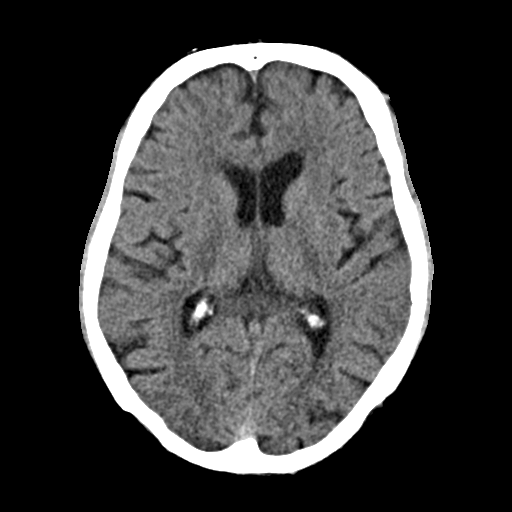
[im 17/33  bone]
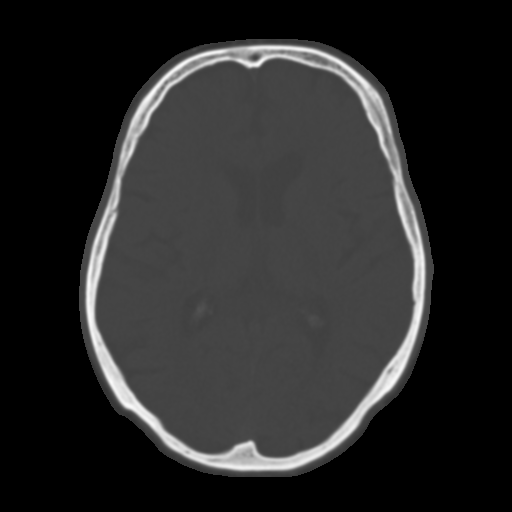
[im 20/33  brain]
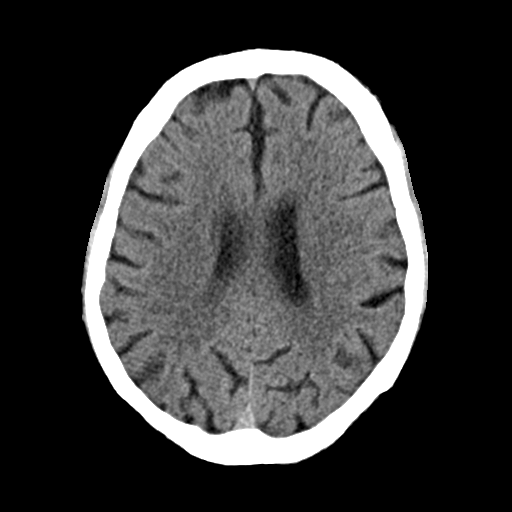
[im 24/33  brain]
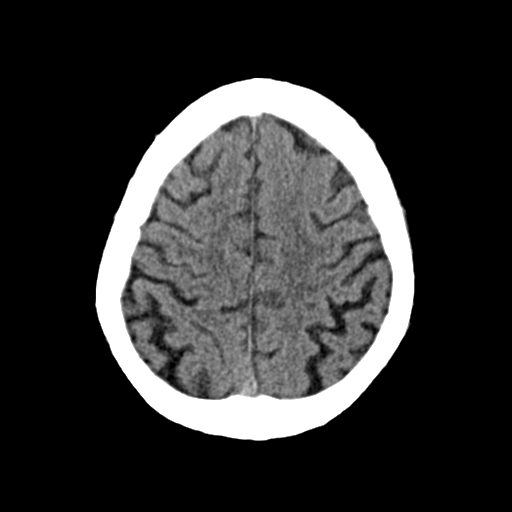
[im 27/33  brain]
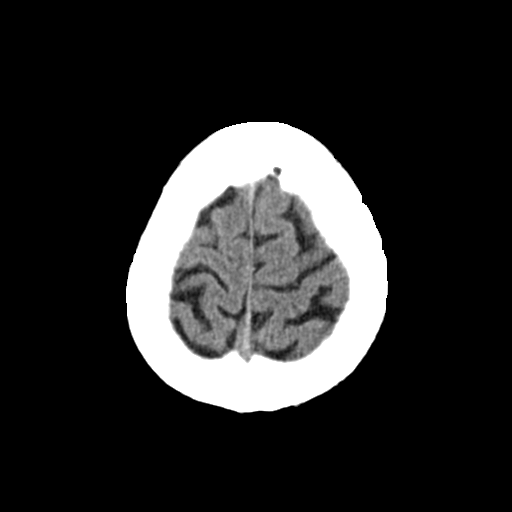
[im 30/33  brain]
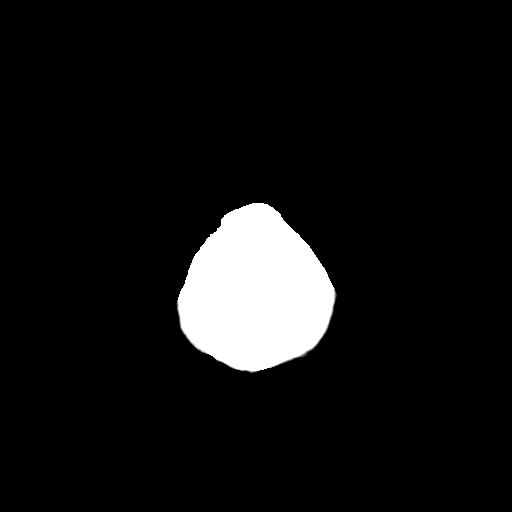
[im 30/33  bone]
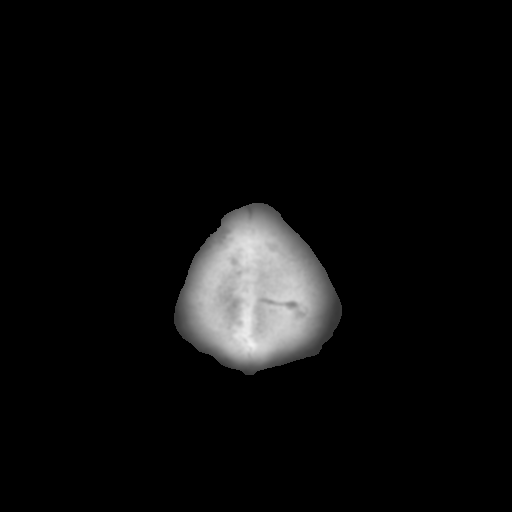

[Series 4: coronal soft · coronal · 0.32mm/px · 3 of 67 slices shown]
[im 23/67  brain]
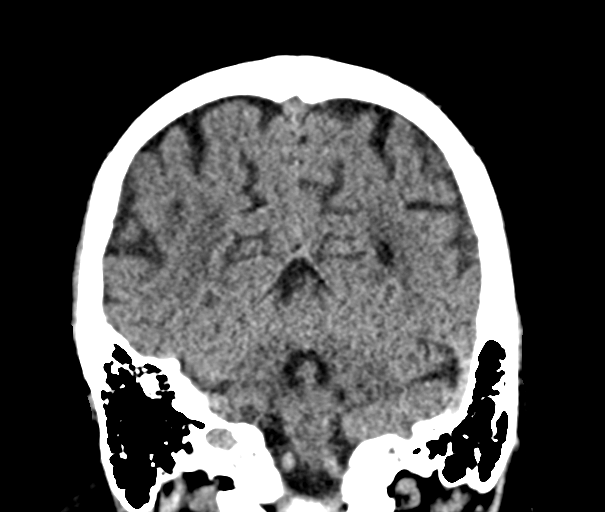
[im 30/67  brain]
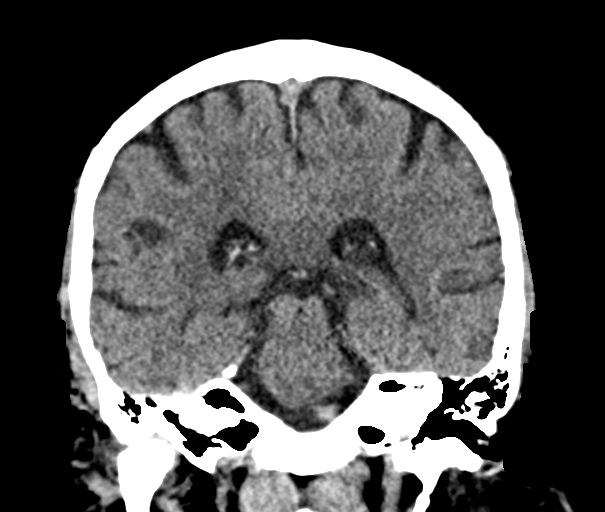
[im 37/67  brain]
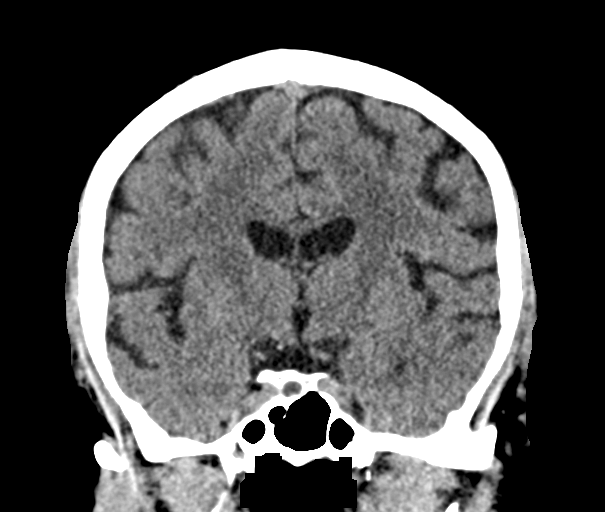

[Series 5: sag soft · sagittal · 0.33mm/px · 3 of 59 slices shown]
[im 20/59  brain]
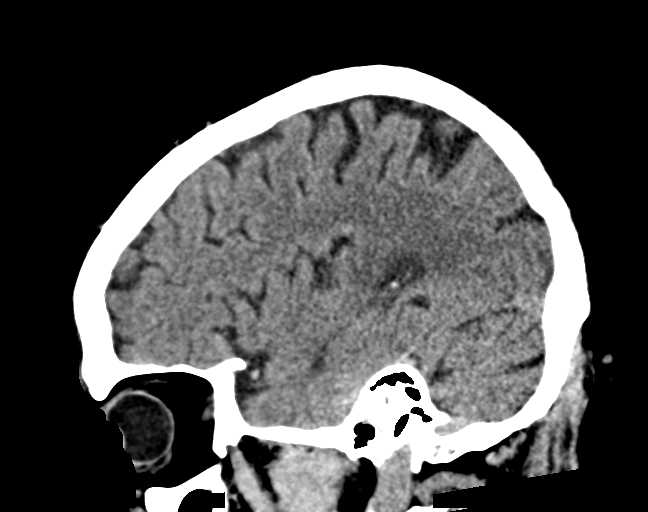
[im 30/59  brain]
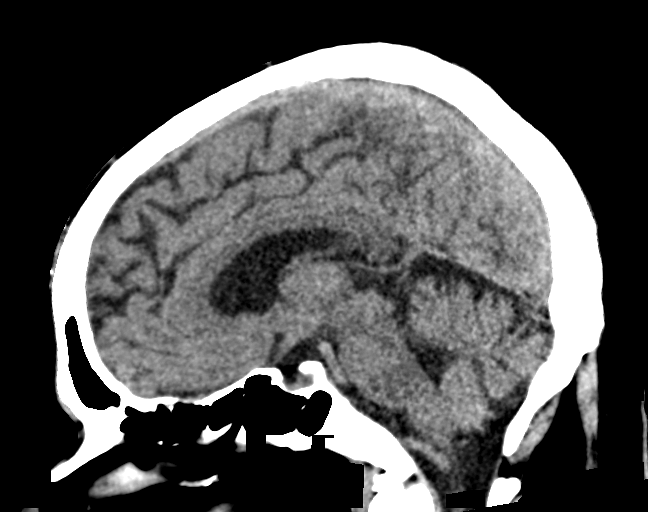
[im 39/59  brain]
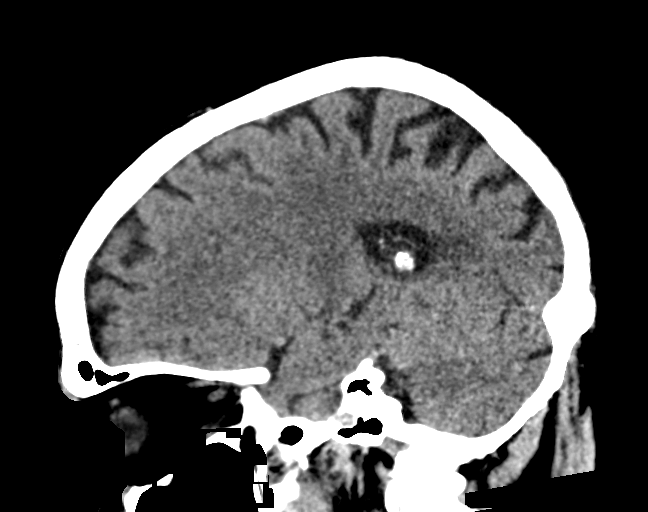

[15 of 47 positions shown; findings below may reference images not displayed]

FINDINGS: Brain: There is mild cerebral atrophy with widening of the
extra-axial spaces and ventricular dilatation.
There are areas of decreased attenuation within the white matter
tracts of the supratentorial brain, consistent with microvascular
disease changes.

Vascular: No hyperdense vessel or unexpected calcification.

Skull: Normal. Negative for fracture or focal lesion.

Sinuses/Orbits: No acute finding.

Other: None.
IMPRESSION: 1. No acute intracranial abnormality.
2. Generalized cerebral atrophy.

## 2023-10-22 ENCOUNTER — Other Ambulatory Visit

## 2023-10-22 DIAGNOSIS — Z006 Encounter for examination for normal comparison and control in clinical research program: Secondary | ICD-10-CM

## 2023-10-31 LAB — GENECONNECT MOLECULAR SCREEN: Genetic Analysis Overall Interpretation: NEGATIVE

## 2023-11-11 ENCOUNTER — Encounter (HOSPITAL_BASED_OUTPATIENT_CLINIC_OR_DEPARTMENT_OTHER): Payer: Self-pay

## 2023-11-11 DIAGNOSIS — I10 Essential (primary) hypertension: Secondary | ICD-10-CM

## 2023-11-11 NOTE — Addendum Note (Signed)
 Addended by: Zorita Hiss on: 11/11/2023 04:19 PM   Modules accepted: Orders

## 2023-11-12 ENCOUNTER — Encounter (HOSPITAL_BASED_OUTPATIENT_CLINIC_OR_DEPARTMENT_OTHER): Payer: Self-pay

## 2023-12-02 ENCOUNTER — Ambulatory Visit (INDEPENDENT_AMBULATORY_CARE_PROVIDER_SITE_OTHER)

## 2023-12-02 DIAGNOSIS — I10 Essential (primary) hypertension: Secondary | ICD-10-CM

## 2023-12-02 LAB — ECHOCARDIOGRAM COMPLETE
Area-P 1/2: 3.27 cm2
S' Lateral: 2.98 cm

## 2023-12-15 ENCOUNTER — Encounter (HOSPITAL_BASED_OUTPATIENT_CLINIC_OR_DEPARTMENT_OTHER): Payer: Self-pay

## 2023-12-26 ENCOUNTER — Ambulatory Visit (HOSPITAL_BASED_OUTPATIENT_CLINIC_OR_DEPARTMENT_OTHER): Payer: Self-pay | Admitting: Cardiology

## 2023-12-31 ENCOUNTER — Encounter (HOSPITAL_BASED_OUTPATIENT_CLINIC_OR_DEPARTMENT_OTHER): Payer: Self-pay | Admitting: Family

## 2023-12-31 ENCOUNTER — Ambulatory Visit (HOSPITAL_BASED_OUTPATIENT_CLINIC_OR_DEPARTMENT_OTHER): Admitting: Family

## 2023-12-31 VITALS — BP 132/84 | HR 76 | Ht 70.0 in | Wt 242.0 lb

## 2023-12-31 DIAGNOSIS — I4892 Unspecified atrial flutter: Secondary | ICD-10-CM | POA: Diagnosis not present

## 2023-12-31 DIAGNOSIS — D6859 Other primary thrombophilia: Secondary | ICD-10-CM

## 2023-12-31 DIAGNOSIS — R0609 Other forms of dyspnea: Secondary | ICD-10-CM

## 2023-12-31 DIAGNOSIS — R600 Localized edema: Secondary | ICD-10-CM

## 2023-12-31 DIAGNOSIS — E782 Mixed hyperlipidemia: Secondary | ICD-10-CM

## 2023-12-31 DIAGNOSIS — I2510 Atherosclerotic heart disease of native coronary artery without angina pectoris: Secondary | ICD-10-CM

## 2023-12-31 MED ORDER — METOPROLOL TARTRATE 100 MG PO TABS: 100.0000 mg | ORAL_TABLET | Freq: Two times a day (BID) | ORAL | 0 refills | Status: DC

## 2023-12-31 NOTE — Patient Instructions (Addendum)
 Medication Instructions:  Your physician recommends that you continue on your current medications as directed. Please refer to the Current Medication list given to you today.   Lab Work: CMP & Lipid Panel 1 week before follow up visit (FASTING)   Testing/Procedures:   Your cardiac CT will be scheduled at one of the below locations:   [facility] [address] [place], [place] [address] [phone]  OR  [facility] [address] [place], [place] [address] [phone]  OR   [facility] [address] [place], [place] [address] [phone]  OR   [facility] [address] [place], [place] [address] [phone]  OR   [facility] [address]  [place], [place] [address]  If scheduled at [facility], please arrive at the [facility] and Children's Entrance (Entrance C2) of [facility] 30 minutes prior to test start time. You can use the FREE valet parking offered at entrance C (encouraged to control the heart rate for the test)  Proceed to the [facility] Radiology Department (first floor) to check-in and test prep.   All radiology patients and guests should use entrance C2 at [facility], accessed from [facility], even though the hospital's physical address listed is [address]    If scheduled at the Heart and Vascular Tower at [address], please enter the parking lot using the [address] entrance and use the FREE valet service at the patient drop-off area. Enter the buidling and check-in with registration on the main floor.  If scheduled at [facility] or [facility], please arrive 15 mins early for check-in and test prep.  There is spacious parking and easy access to the radiology department from the [facility]ascular  entrance. Please enter here and check-in with the desk attendant.   If scheduled at [facility], please arrive 30 minutes early for check-in and test prep.  Please follow these instructions carefully (unless otherwise directed):  An IV will be required for this test and Nitroglycerin will be given.  Hold all erectile dysfunction medications at least 3 days (72 hrs) prior to test. (Ie viagra, cialis, sildenafil, tadalafil, etc)   On the Night Before the Test: Be sure to Drink plenty of water. Do not consume any caffeinated/decaffeinated beverages or chocolate 12 hours prior to your test. Do not take any antihistamines 12 hours prior to your test.  On the Day of the Test: Drink plenty of water until 1 hour prior to the test. Do not eat any food 1 hour prior to test. You may take your regular medications prior to the test.  Take metoprolol  (Lopressor ) 100mg  two hours prior to test. If you take Furosemide /Hydrochlorothiazide/Spironolactone/Chlorthalidone , please HOLD on the morning of the test. Patients who wear a continuous glucose monitor MUST remove the device prior to scanning.      After the Test: Drink plenty of water. After receiving IV contrast, you may experience a mild flushed feeling. This is normal. On occasion, you may experience a mild rash up to 24 hours after the test. This is not dangerous. If this occurs, you can take Benadryl  25 mg, Zyrtec, Claritin, or Allegra and increase your fluid intake. (Patients taking Tikosyn should avoid Benadryl , and may take Zyrtec, Claritin, or Allegra) If you experience trouble breathing, this can be serious. If it is severe  call 911 IMMEDIATELY. If it is mild, please call our office.  We will call to schedule your test 2-4 weeks out understanding that some insurance companies will need an authorization prior to the service being performed.   For more information and frequently asked questions, please visit our website :  [email or web address]  For non-scheduling related questions, please contact the cardiac imaging nurse navigator should you have any questions/concerns: Cardiac Imaging Nurse Navigators Direct Office Dial: [phone]   For scheduling needs, including cancellations and rescheduling, please call [place], [phone].   Follow-Up: Please follow up in 3-4 months with Dr. Veryl Gottron, Slater Duncan, NP or Neomi Banks, NP   Other Instructions If your blood pressure is not completely controlled on Losartan , could consider swapping your Losartan  for Valsartan or Olmesartan as they are a bit stronger.   Could consider Spironolactone to help with swelling and blood pressure.   If your blood pressure at home is consistently more than 140, let us  or primary care know.   To prevent or reduce lower extremity swelling: Eat a low salt diet. Salt makes the body hold onto extra fluid which causes swelling. Sit with legs elevated. For example, in the recliner or on an ottoman.  Wear knee-high compression stockings during the daytime. Ones labeled 15-20 mmHg provide good compression.

## 2023-12-31 NOTE — Progress Notes (Signed)
 Cardiology Office Note   Date:  12/31/2023  ID:  Wah, Sabic 07/17/1945, MRN [id number] PCP: Ruel Cotta, Heinz Llano, MD  [facility] Providers Cardiologist:  Sheryle Donning, MD     History of Present Illness Charles [place] is a 78 y.o. male with hx of RBBB, pneumonia 06/2018-06/2019 with atrial flutter with paroxysmal atrial fib diagnosed at the same time, coronary artery calcification, hypertension.Initially on dronedarone  (stopped after 30 days), diltiazem , metoprolol . Underwent successful ablation 04/2021. Noted statin intolerance.   Last seen 06/29/23 with some LE edema after travel. Echo 12/02/23 LVEF 60-65%, gr1dd, mild lVH, RV normal, trivial MR, mild dilation aortic root 40mm and ascending aorta 42mm.   Presents today for swelling. Reviewed echocardiogram. Reports Furosemide  is not making much impact in his swelling. Predominantly feet and ankle swelling. No worse by end of day. He is sitting with his feet elevated. PCP stopped Amlodipine today to see if this helps. He does have BP at home with readings usually 130/80. Has follow up with PCP In one months. He eats predominantly at home and does add salt to his food.  He notes shortness of breath. Reports workup with pulmonology was unremarkable. Reports he can do 30 minutes on treadmill at 3 mph at 3-4 degrees elevation doing well. Just started back to doing this this week with goal of 4 days per week. However, if walking the dog outside feels short of breath after half a block outside. Initial onset with wheeze last year. Per pulmonary bnote from 12/24/23 may be a component of physical deconditioning to his complaint. Presently working with PT on a shoulder issue. Notes limited activity has led to weight gain 15 lbs which may contribute to dyspnea. However, reports his PCP encourage dhim to have his heart checked due to exertional dyspnea.   ROS: Please see the history of present illness.    All other systems  reviewed and are negative.   Studies Reviewed EKG Interpretation Date/Time:  Friday December 31 2023 08:02:22 EDT Ventricular Rate:  76 PR Interval:  166 QRS Duration:  134 QT Interval:  422 QTC Calculation: 474 R Axis:   -7  Text Interpretation: Normal sinus rhythm Right bundle branch block  No acute ST/T wave changes Confirmed by Neomi Banks ([id number]) on 12/31/2023 8:13:50 AM    Cardiac Studies & Procedures   ______________________________________________________________________________________________     ECHOCARDIOGRAM  ECHOCARDIOGRAM COMPLETE 12/02/2023  Narrative ECHOCARDIOGRAM REPORT    Patient Name:   [place] Date of Exam: 12/02/2023 Medical Rec #:  [id number]        Height:       70.0 in Accession #:    [id number]       Weight:       235.0 lb Date of Birth:  [birth year]        BSA:          2.235 m Patient Age:    77 years         BP:           125/70 mmHg Patient Gender: M                HR:           71 bpm. Exam Location:  Outpatient  Procedure: 3D Echo, 2D Echo, Cardiac Doppler, Color Doppler and Strain Analysis (Both Spectral and Color Flow Doppler were utilized during procedure).  Indications:    Dyspnea  History:        Patient has  no prior history of Echocardiogram examinations. Arrythmias:Atrial Fibrillation and Atrial Flutter; Risk Factors:Former Smoker and Hypertension.  Sonographer:    Gelene Kelly RDCS Referring Phys: [id number] [name]  IMPRESSIONS   1. Left ventricular ejection fraction, by estimation, is 60 to 65%. Left ventricular ejection fraction by 3D volume is 60 %. The left ventricle has normal function. The left ventricle has no regional wall motion abnormalities. There is mild left ventricular hypertrophy. Left ventricular diastolic parameters are consistent with Grade I diastolic dysfunction (impaired relaxation). The average left ventricular global longitudinal strain is -18.1 %. The global longitudinal strain is  abnormal. 2. Right ventricular systolic function is normal. The right ventricular size is normal. There is normal pulmonary artery systolic pressure. The estimated right ventricular systolic pressure is 11.1 mmHg. 3. Right atrial size was mildly dilated. 4. The mitral valve is grossly normal. Trivial mitral valve regurgitation. 5. The aortic valve is tricuspid. There is moderate calcification of the aortic valve. Aortic valve regurgitation is not visualized. Aortic valve sclerosis/calcification is present, without any evidence of aortic stenosis. 6. Aortic dilatation noted. There is mild dilatation of the aortic root, measuring 40 mm. There is mild dilatation of the ascending aorta, measuring 42 mm. 7. The inferior vena cava is normal in size with greater than 50% respiratory variability, suggesting right atrial pressure of 3 mmHg.  Comparison(s): No prior Echocardiogram.  FINDINGS Left Ventricle: Left ventricular ejection fraction, by estimation, is 60 to 65%. Left ventricular ejection fraction by 3D volume is 60 %. The left ventricle has normal function. The left ventricle has no regional wall motion abnormalities. The average left ventricular global longitudinal strain is -18.1 %. Strain was performed and the global longitudinal strain is abnormal. The left ventricular internal cavity size was normal in size. There is mild left ventricular hypertrophy. Left ventricular diastolic parameters are consistent with Grade I diastolic dysfunction (impaired relaxation). Indeterminate filling pressures.  Right Ventricle: The right ventricular size is normal. No increase in right ventricular wall thickness. Right ventricular systolic function is normal. There is normal pulmonary artery systolic pressure. The tricuspid regurgitant velocity is 1.42 m/s, and with an assumed right atrial pressure of 3 mmHg, the estimated right ventricular systolic pressure is 11.1 mmHg.  Left Atrium: Left atrial size was  normal in size.  Right Atrium: Right atrial size was mildly dilated.  Pericardium: There is no evidence of pericardial effusion.  Mitral Valve: The mitral valve is grossly normal. Trivial mitral valve regurgitation.  Tricuspid Valve: The tricuspid valve is grossly normal. Tricuspid valve regurgitation is trivial.  Aortic Valve: The aortic valve is tricuspid. There is moderate calcification of the aortic valve. Aortic valve regurgitation is not visualized. Aortic valve sclerosis/calcification is present, without any evidence of aortic stenosis.  Pulmonic Valve: The pulmonic valve was grossly normal. Pulmonic valve regurgitation is trivial.  Aorta: Aortic dilatation noted. There is mild dilatation of the aortic root, measuring 40 mm. There is mild dilatation of the ascending aorta, measuring 42 mm.  Venous: The inferior vena cava is normal in size with greater than 50% respiratory variability, suggesting right atrial pressure of 3 mmHg.  IAS/Shunts: No atrial level shunt detected by color flow Doppler.  Additional Comments: 3D was performed not requiring image post processing on an independent workstation and was normal.   LEFT VENTRICLE PLAX 2D LVIDd:         5.35 cm         Diastology LVIDs:         2.98  cm         LV e' medial:    6.09 cm/s LV PW:         1.03 cm         LV E/e' medial:  11.7 LV IVS:        0.98 cm         LV e' lateral:   7.40 cm/s LVOT diam:     2.10 cm         LV E/e' lateral: 9.6 LV SV:         89 LV SV Index:   40              2D Longitudinal LVOT Area:     3.46 cm        Strain 2D Strain GLS   -21.7 % (A4C): 2D Strain GLS   -17.4 % (A3C): 2D Strain GLS   -16.1 % (A2C): 2D Strain GLS   -18.1 % Avg:  3D Volume EF LV 3D EF:    Left ventricul ar ejection fraction by 3D volume is 60 %.  3D Volume EF: 3D EF:        60 % LV EDV:       123 ml LV ESV:       49 ml LV SV:        74 ml  RIGHT VENTRICLE RV Basal diam:  3.70 cm RV Mid diam:     3.13 cm RV S prime:     15.20 cm/s TAPSE (M-mode): 3.6 cm  LEFT ATRIUM             Index        RIGHT ATRIUM           Index LA diam:        4.40 cm 1.97 cm/m   RA Area:     21.00 cm LA Vol (A2C):   63.8 ml 28.54 ml/m  RA Volume:   64.40 ml  28.81 ml/m LA Vol (A4C):   45.4 ml 20.31 ml/m LA Biplane Vol: 56.5 ml 25.28 ml/m AORTIC VALVE LVOT Vmax:   118.00 cm/s LVOT Vmean:  78.100 cm/s LVOT VTI:    0.258 m  AORTA Ao Root diam: 4.00 cm Ao Asc diam:  4.20 cm  MITRAL VALVE               TRICUSPID VALVE MV Area (PHT): 3.27 cm    TR Peak grad:   8.1 mmHg MV Decel Time: 232 msec    TR Vmax:        142.00 cm/s MV E velocity: 71.30 cm/s MV A velocity: 56.10 cm/s  SHUNTS MV E/A ratio:  1.27        Systemic VTI:  0.26 m Systemic Diam: 2.10 cm  Dinah Franco MD Electronically signed by Dinah Franco MD Signature Date/Time: 12/02/2023/2:50:35 PM    Final          ______________________________________________________________________________________________       Risk Assessment/Calculations  CHA2DS2-VASc Score = 4   This indicates a 4.8% annual risk of stroke. The patient's score is based upon: CHF History: 0 HTN History: 1 Diabetes History: 0 Stroke History: 0 Vascular Disease History: 1 Age Score: 2 Gender Score: 0            Physical Exam VS:  BP 132/84   Pulse 76   Ht 5' 10 (1.778 m)   Wt 242 lb (109.8 kg)   SpO2 97%   BMI  34.72 kg/m        Wt Readings from Last 3 Encounters:  12/31/23 242 lb (109.8 kg)  09/11/23 235 lb (106.6 kg)  06/29/23 234 lb (106.1 kg)    GEN: Well nourished, well developed in no acute distress NECK: No JVD; No carotid bruits CARDIAC: RRR, no murmurs, rubs, gallops RESPIRATORY:  Clear to auscultation without rales, wheezing or rhonchi  ABDOMEN: Soft, non-tender, non-distended EXTREMITIES:  No edema; No deformity   ASSESSMENT AND PLAN  Assessment & Plan Hypertension Blood pressure management complicated by  amlodipine discontinuation yesterday by PCP due to leg swelling. Losartan  may be insufficient alone. - Monitor home blood pressure, especially post-amlodipine discontinuation - Contact cardiology or primary care if BP >140 mmHg. - Consider switching to valsartan or olmesartan if BP control inadequate. (Can discuss with PCP at upcoming visit next month)  Leg swelling Persistent swelling in feet and ankles, left side more affected. Amlodipine discontinued. Possible venous insufficiency. Recent echo wtihout cardiac cause.  - Elevate legs, use compression socks. - Monitor swelling post-amlodipine discontinuation.  - Consider spironolactone if swelling persists and BP elevated after two weeks. However, if cause by venous insufficiency low suspicion diuretic will be effective as Lasix  20mg  daily has been overall ineffective.  Shortness of breath Occurs with outdoor exertion but not indoor exertion. Pulmonary workup suggested physical deconditioning contributory. Given known coronary calcification on prior CT, plan for ischemic evaluation with cardiac CTA.  - Order cardiac CTA to assess coronary arteries (12/30/23 creatinine 1.08, GFR 70) - Encourage treadmill exercise with gradual incline increase. - Consider referral to Physician Referred Exercise Program if interested, he will contact us  if he would like referral.  DM2 A1c increased from 6.3% to 7.0%, likely due to weight gain and decreased activity. - Increase fiber intake, reduce sugar and cholesterol. - Encourage regular physical activity, including treadmill walking with incline. - Continue to follow with PCP.   Atrial flutter / Hypercoagulable state Maintaining NSR. Continue Xarelto  20mg  daily. CHA2DS2-VASc Score = 4 [CHF History: 0, HTN History: 1, Diabetes History: 0, Stroke History: 0, Vascular Disease History: 1, Age Score: 2, Gender Score: 0].  Therefore, the patient's annual risk of stroke is 4.8 %.      Hyperlipidemia Cholesterol  levels increased. Zetia 10mg  daily insufficient. Prior intolerance to statin.. Lifestyle changes prioritized before additional medication. - Increase dietary fish, avocados, nuts. - Encourage regular physical activity for weight loss and cholesterol reduction. - Recheck cholesterol in 3-4 months. - Provided information on PCSK9 inhibitors and bempedoic acid for future consideration based on labs in 3 months         Dispo: follow up in 3-4 mos  Signed, Clearnce Curia, NP

## 2024-01-06 ENCOUNTER — Encounter (HOSPITAL_COMMUNITY): Payer: Self-pay

## 2024-01-07 ENCOUNTER — Telehealth (HOSPITAL_COMMUNITY): Payer: Self-pay | Admitting: *Deleted

## 2024-01-07 NOTE — Telephone Encounter (Signed)
 Reaching out to patient to offer assistance regarding upcoming cardiac imaging study; pt verbalizes understanding of appt date/time, parking situation and where to check in, pre-test NPO status and medications ordered, and verified current allergies; name and call back number provided for further questions should they arise  Larey Brick RN Navigator Cardiac Imaging [facility] [phone] office [phone] cell  Patient to take 100mg  metoprolol tartrate two hours prior to his cardiac CT scan.

## 2024-01-10 ENCOUNTER — Telehealth (HOSPITAL_BASED_OUTPATIENT_CLINIC_OR_DEPARTMENT_OTHER): Payer: Self-pay | Admitting: Cardiology

## 2024-01-10 ENCOUNTER — Ambulatory Visit (HOSPITAL_COMMUNITY)
Admission: RE | Admit: 2024-01-10 | Discharge: 2024-01-10 | Disposition: A | Discharge status: Home / Self Care | Source: Ambulatory Visit | Attending: Cardiology

## 2024-01-10 ENCOUNTER — Ambulatory Visit (HOSPITAL_COMMUNITY)
Admission: RE | Admit: 2024-01-10 | Discharge: 2024-01-10 | Disposition: A | Discharge status: Home / Self Care | Source: Ambulatory Visit | Attending: Cardiovascular Disease | Admitting: Cardiovascular Disease

## 2024-01-10 ENCOUNTER — Ambulatory Visit (HOSPITAL_BASED_OUTPATIENT_CLINIC_OR_DEPARTMENT_OTHER): Payer: Self-pay | Admitting: Cardiology

## 2024-01-10 ENCOUNTER — Other Ambulatory Visit: Payer: Self-pay | Admitting: Cardiology

## 2024-01-10 DIAGNOSIS — R0609 Other forms of dyspnea: Secondary | ICD-10-CM | POA: Insufficient documentation

## 2024-01-10 DIAGNOSIS — I2510 Atherosclerotic heart disease of native coronary artery without angina pectoris: Secondary | ICD-10-CM | POA: Insufficient documentation

## 2024-01-10 DIAGNOSIS — I700 Atherosclerosis of aorta: Secondary | ICD-10-CM | POA: Insufficient documentation

## 2024-01-10 DIAGNOSIS — R931 Abnormal findings on diagnostic imaging of heart and coronary circulation: Secondary | ICD-10-CM | POA: Diagnosis not present

## 2024-01-10 DIAGNOSIS — I25118 Atherosclerotic heart disease of native coronary artery with other forms of angina pectoris: Secondary | ICD-10-CM

## 2024-01-10 MED ORDER — IOHEXOL 350 MG/ML SOLN
100.0000 mL | Freq: Once | INTRAVENOUS | Status: AC | PRN
  Administered 2024-01-10: 100 mL via INTRAVENOUS

## 2024-01-10 MED ORDER — NITROGLYCERIN 0.4 MG SL SUBL
0.8000 mg | SUBLINGUAL_TABLET | Freq: Once | SUBLINGUAL | Status: AC
  Administered 2024-01-10: 0.8 mg via SUBLINGUAL

## 2024-01-10 MED ORDER — ISOSORBIDE MONONITRATE ER 30 MG PO TB24: 30.0000 mg | ORAL_TABLET | Freq: Every day | ORAL | 3 refills | Status: DC

## 2024-01-10 MED ORDER — ASPIRIN 81 MG PO TBEC: 81.0000 mg | DELAYED_RELEASE_TABLET | Freq: Every day | ORAL | Status: DC

## 2024-01-10 MED ORDER — NITROGLYCERIN 0.4 MG SL SUBL: 0.4000 mg | SUBLINGUAL_TABLET | SUBLINGUAL | 3 refills | Status: AC | PRN

## 2024-01-10 NOTE — Telephone Encounter (Signed)
 Called patient to discuss results of CT. This shows significant stenosis in proximal LAD/distal left main, with additional stenosis in mid and distal LAD.  Reviewed his recent clinical course. Stopped amlodipine due to leg swelling; has improved since stopping this and starting spironolactone. Blood pressures initially elevated, then improved after several days. Reviewed echo results, reassuring.  Discussed his CT results. Discussed severe stenosis of proximal LAD by visual and FFR analysis. Also has visual stenosis in mid and distal LAD.   We discussed stable vs unstable angina at length. Discussed medical management of stable angina but that unstable symptoms need 911/ER immediately.  We discussed cath at length. I discussed limitations of CT and that cardiac catheterization is gold standard. I did discuss that based on the CT images, PCI may be complex, and 1V CABG with LIMA-LAD might be recommended instead. We discussed that he is generally in excellent health for his age and could tolerate CABG if needed.  Went to the gym every day last week, doing 5% incline at 3 mph without symptoms. However, when walking outside, he notes dyspnea on exertion after about 1/2 a block walking his dog. Feels better after a minute or less of resting.  Has history of hematuria in the past. None recently. We did discuss adding aspirin to his Xarelto . He takes ibuprofen intermittently due to shoulder pain; we did discuss risk of NSAIDs and increased risk of MI. He will try to avoid NSAIDs and use tylenol  for pain.  End of August he is flying to [place] for a week, then [place], then [place]. He plans to hike at [address] feet. We discussed risk of elevation at length, O2 saturation/viscosity, signs/symptoms to watch for. We discussed that these are locations without quick access to medical care, and that a heart attack in these places could be fatal. Discussed risks of proceeding with trip vs. Postponing. He is fairly risk  tolerant and would like to proceed with trip at this time, understanding the risks, but his partner is risk averse. I recommended that they discuss this and then we can discuss plans when we meet in a few weeks.  We discussed imdur for angina, as he is already on max metoprolol  dose and didn't tolerate amlodipine due to edema. Recommended that he start at isosorbide 30 mg daily for one week, then if no hypotension, headache, lightheadedness, increase to 60 mg daily. Also ordered PRN SL NG, instructed on use. Confirmed he is not taking a PDE5i inhibitor.  Also discussed that his cholesterol is not at goal. Doesn't tolerate statin. With severe CAD, recommend PCSK9i. Would check lp(a) at follow up as well.  We will aim for him to come back in two weeks. Recommended he keep BP log, symptom log. Reviewed multiple times red flags that need immediate medical attention.  Today, I have spent 49 minutes with the patient with telehealth technology discussing the above problems.  Additional time spent in chart review, documentation, and communication.

## 2024-01-11 ENCOUNTER — Telehealth (HOSPITAL_BASED_OUTPATIENT_CLINIC_OR_DEPARTMENT_OTHER): Payer: Self-pay | Admitting: Cardiology

## 2024-01-11 ENCOUNTER — Other Ambulatory Visit (HOSPITAL_COMMUNITY)

## 2024-01-11 ENCOUNTER — Encounter (HOSPITAL_BASED_OUTPATIENT_CLINIC_OR_DEPARTMENT_OTHER): Payer: Self-pay

## 2024-01-11 NOTE — Telephone Encounter (Signed)
 Pt states after reading his CT results he thinks he should be seen in the cath lab today and would like a c/b from Dr. Lonni. Please advise

## 2024-01-11 NOTE — Telephone Encounter (Signed)
 Caller Arleene - daughter, not on Contact List) stated she is concerned patient has LAD and wants patient called to discuss next steps.

## 2024-01-11 NOTE — Telephone Encounter (Signed)
 Spoke with daughter and advised would forward to Dr Lonni for review

## 2024-01-12 ENCOUNTER — Ambulatory Visit (HOSPITAL_BASED_OUTPATIENT_CLINIC_OR_DEPARTMENT_OTHER): Payer: Self-pay | Admitting: Family

## 2024-01-13 ENCOUNTER — Telehealth (HOSPITAL_BASED_OUTPATIENT_CLINIC_OR_DEPARTMENT_OTHER): Payer: Self-pay | Admitting: Cardiology

## 2024-01-13 ENCOUNTER — Encounter (HOSPITAL_BASED_OUTPATIENT_CLINIC_OR_DEPARTMENT_OTHER): Payer: Self-pay

## 2024-01-13 NOTE — Telephone Encounter (Signed)
 Patient requested that I speak to his his daughter Jocelyn Reese (see consent in mychart message).   Reviewed findings, recommendations for management of stable angina (medical management) vs how management changes if this becomes unstable at any point. Reviewed plan, started imdur to aim for resolution of limiting DOE, follow up scheduled 7/18 to discussed response, and if symptoms persist, recommend cath. Also discussed PCI vs. CABG, that if there was a question on cath of best approach, we can have our heart team review. Discussed plans for PCKS9i at appt given that he has not tolerated statins and LDL not at goal on zetia. Offered to have her conference in to visit if patient agrees so that she can be part of the discussion.  Total time spent on call 30 minutes (not billed). All questions answered.  Shelda Bruckner, MD, PhD, [facility]  [facility]  [place]  [facility] at [facility] at [facility] [address], [address] [place], [place] [address] [phone]

## 2024-01-16 ENCOUNTER — Encounter (HOSPITAL_BASED_OUTPATIENT_CLINIC_OR_DEPARTMENT_OTHER): Payer: Self-pay

## 2024-01-16 DIAGNOSIS — K625 Hemorrhage of anus and rectum: Secondary | ICD-10-CM

## 2024-01-17 LAB — CBC
Hematocrit: 43.7 % (ref 37.5–51.0)
Hemoglobin: 14.8 g/dL (ref 13.0–17.7)
MCH: 32.5 pg (ref 26.6–33.0)
MCHC: 33.9 g/dL (ref 31.5–35.7)
MCV: 96 fL (ref 79–97)
Platelets: 194 x10E3/uL (ref 150–450)
RBC: 4.55 x10E6/uL (ref 4.14–5.80)
RDW: 12.8 % (ref 11.6–15.4)
WBC: 6.3 x10E3/uL (ref 3.4–10.8)

## 2024-01-17 NOTE — Telephone Encounter (Signed)
 Please review and advise.

## 2024-01-17 NOTE — Telephone Encounter (Signed)
 Hold Aspirin , continue Xarelto , CBC today or tomorrow for monitoring.   Charles Bolls S Adda Stokes, NP

## 2024-01-18 ENCOUNTER — Ambulatory Visit (HOSPITAL_BASED_OUTPATIENT_CLINIC_OR_DEPARTMENT_OTHER): Payer: Self-pay | Admitting: Family

## 2024-01-19 ENCOUNTER — Encounter (HOSPITAL_BASED_OUTPATIENT_CLINIC_OR_DEPARTMENT_OTHER): Payer: Self-pay

## 2024-01-25 ENCOUNTER — Encounter (HOSPITAL_BASED_OUTPATIENT_CLINIC_OR_DEPARTMENT_OTHER): Payer: Self-pay | Admitting: Cardiology

## 2024-01-25 ENCOUNTER — Ambulatory Visit (HOSPITAL_BASED_OUTPATIENT_CLINIC_OR_DEPARTMENT_OTHER): Admitting: Cardiology

## 2024-01-25 VITALS — BP 110/70 | HR 77 | Ht 70.0 in | Wt 238.0 lb

## 2024-01-25 DIAGNOSIS — E7800 Pure hypercholesterolemia, unspecified: Secondary | ICD-10-CM

## 2024-01-25 DIAGNOSIS — I10 Essential (primary) hypertension: Secondary | ICD-10-CM | POA: Diagnosis not present

## 2024-01-25 DIAGNOSIS — I25118 Atherosclerotic heart disease of native coronary artery with other forms of angina pectoris: Secondary | ICD-10-CM

## 2024-01-25 DIAGNOSIS — I480 Paroxysmal atrial fibrillation: Secondary | ICD-10-CM

## 2024-01-25 DIAGNOSIS — Z789 Other specified health status: Secondary | ICD-10-CM

## 2024-01-25 DIAGNOSIS — Z7901 Long term (current) use of anticoagulants: Secondary | ICD-10-CM

## 2024-01-25 DIAGNOSIS — D6869 Other thrombophilia: Secondary | ICD-10-CM

## 2024-01-25 NOTE — H&P (View-Only) (Signed)
 Cardiology Office Note:  .   Date:  01/25/2024  ID:  Charles Cameron, DOB Oct 01, 1945, MRN [id number] PCP: Nikki Rams, Aliene, MD  [facility] Providers Cardiologist:  Shelda Bruckner, MD {  History of Present Illness: Charles Cameron is a 78 y.o. male with a hx of pneumonia 06/2018-07/2018 with atrial flutter with paroxysmal atrial fibrillation diagnosed at the same time, coronary artery disease, hypertension. He is seen for follow up today.   Cardiac history: No prior history until 06/2018, when he developed URI and atrial flutter/atrial fibrillation. He had intermittent RVR while in the hospital, requiring multiple medications for management. He was initially on dronedarone  (stopped after 30 days), diltiazem , and metoprolol . Had volume overload with arrhythmia, improved with lasix . EF was normal. Afib has recurred, underwent successful ablation 04/2021.  Today: Here with his wife today. We reviewed the results of his CT together, reviewing actual images together today. Discussed stable vs. Unstable angina, management of this, options for cath and revascularization.   He feels that he can remain active, walking up to 2 miles at a good pace. Has shortness of breath initially on an incline, but did not have to stop. However, there have bene other times on flat ground at slow speeds where he did have limiting shortness of breath. Wife notes that he sounds like he is wheezing. Stops for about a minute or so, then able to continue.  No change in symptoms on imdur . Tolerating 30 mg pill of imdur , still getting headaches intermittently with this. Confirmed he is not taking metoprolol  per wife.  Had mild rectal bleeding on xarelto  and aspirin  therapy, dropped to xarelto  alone. CBC stable.   ROS: Denies chest pain, shortness of breath at rest, though notes some mild conversational dyspnea on occasion. No PND, orthopnea, LE edema or unexpected weight gain. No syncope or palpitations.  ROS otherwise negative except as noted.   Studies Reviewed: SABRA    EKG:  EKG Interpretation Date/Time:  Tuesday January 25 2024 11:43:23 EDT Ventricular Rate:  67 PR Interval:  168 QRS Duration:  132 QT Interval:  436 QTC Calculation: 460 R Axis:   -10  Text Interpretation: Sinus rhythm with occasional Premature ventricular complexes Right bundle branch block When compared with ECG of 31-Dec-2023 08:02, Premature ventricular complexes are now Present Confirmed by Bruckner Shelda [phone]) on 01/25/2024 11:54:15 AM    Physical Exam:   VS:  BP 110/70 (BP Location: Right Arm, Patient Position: Sitting, Cuff Size: Normal)   Pulse 77   Ht 5' 10 (1.778 m)   Wt 238 lb (108 kg)   SpO2 98%   BMI 34.15 kg/m    Wt Readings from Last 3 Encounters:  01/25/24 238 lb (108 kg)  12/31/23 242 lb (109.8 kg)  09/11/23 235 lb (106.6 kg)    GEN: Well nourished, well developed in no acute distress HEENT: Normal, moist mucous membranes NECK: No JVD CARDIAC: regular rhythm, normal S1 and S2, no rubs or gallops. No murmur. VASCULAR: Radial and DP pulses 2+ bilaterally. No carotid bruits RESPIRATORY:  Clear to auscultation without rales, wheezing or rhonchi  ABDOMEN: Soft, non-tender, non-distended MUSCULOSKELETAL:  Ambulates independently SKIN: Warm and dry, mild bilateral LE edema R>L, no warmth, erythema, pain NEUROLOGIC:  Alert and oriented x 3. No focal neuro deficits noted. PSYCHIATRIC:  Normal affect    ASSESSMENT AND PLAN: .    CAD, obstructive, with stable angina -see extensive phone note and review of his coronary CT -stable  symptoms; we have reviewed what constitutes unstable angina and needs immediate medical attention: -antianginals: on phone conversation, impression was that he was still on metoprolol , but wife confirms today that he has been off of this. Has not tolerated amlodipine in the past. Tried low dose imdur , able to tolerate 30 mg dose but still has headaches even after  several weeks on this.  -tried adding aspirin  to Xarelto , but he had hematochezia (stable CBC) so returned to Xarelto  only -given persistent symptoms of stable angina, location/severity of lesion, we will pursue cath. I discussed risks/benefits at length, see below. I have expressed my concern that this may not be an easily stented lesion, as it appears to start in left main and continue into proximal LAD. He also has additional LAD lesions distal to this proximal one (though since FFR already positive at proximal lesion, difficult to assess if these other lesions are hemodynamically significant). I am also concerned about his ability to tolerate DAPT and potentially triple therapy as he bled on aspirin  and Xarelto .  He is in excellent physical shape, and if CABG with LIMA to LAD is preferred, he would be a good surgical candidate. He could also at that time have left atrial clipping, which may give us  some protection against stroke if anticoagulation needs to be stopped in the future (has had remote hematuria and now some hematochezia).  I discussed that the cath will be the definitive evaluation for potential for PCI vs. CABG, and that images may need to be discussed at heart team meeting if there is equipoise. He understands. Will hold Xarelto  prior to procedure and have him taken aspirin  81 mg daily on the days his Xarelto  is held. He had labs on 7/8 (in Care Everywhere) and ECG today.  Informed Consent   Shared Decision Making/Informed Consent{  The risks [stroke (1 in 1000), death (1 in 1000), kidney failure [usually temporary] (1 in 500), bleeding (1 in 200), allergic reaction [possibly serious] (1 in 200)], benefits (diagnostic support and management of coronary artery disease) and alternatives of a cardiac catheterization were discussed in detail with [name] and he is willing to proceed.      Hyperlipidemia -on ezetimibe, has not tolerated statins -will refer to pharmD clinic to discuss  PCKS9i -LDL goal <70, last LDL 126 in care everywhere  History of paroxysmal Atrial flutter/paroxysmal atrial fibrillation -s/p ablation 04/2021 -EF 70-75%, moderate MR, concern for severe LA dilation -CHA2DS2/VAS Stroke Risk Points= 3, on anticoagulation for secondary hypercoagulable state     Hypertension -typically well controlled -continue losartan , amlodipine  CV risk counseling and prevention -recommend heart healthy/Mediterranean diet, with whole grains, fruits, vegetable, fish, lean meats, nuts, and olive oil. Limit salt. -recommend moderate walking, 3-5 times/week for 30-50 minutes each session. Aim for at least 150 minutes.week. Goal should be pace of 3 miles/hours, or walking 1.5 miles in 30 minutes -recommend avoidance of tobacco products. Avoid excess alcohol.  Dispo: based on results of cath, will either see back here in several weeks (if PCI) or longer if planned for CABG  Total time of encounter: I spent 57 minutes dedicated to the care of this patient on the date of this encounter to include pre-visit review of records, face-to-face time with the patient discussing conditions above, and clinical documentation with the electronic health record. We specifically spent time today discussing cardiac CT, discussing medications, discussing cath and possible outcomes.   Signed, Shelda Bruckner, MD   Shelda Bruckner, MD, PhD, [facility]  [facility]  [place]  Heart & Vascular at [facility] at [facility] [address], [address] [place], [place] [address] [phone]

## 2024-01-25 NOTE — Progress Notes (Signed)
 Cardiology Office Note:  .   Date:  01/25/2024  ID:  [name], DOB Oct 01, 1945, MRN [id number] PCP: Nikki Rams, Aliene, MD  [facility] Providers Cardiologist:  Shelda Bruckner, MD {  History of Present Illness: Charles Cameron is a 78 y.o. male with a hx of pneumonia 06/2018-07/2018 with atrial flutter with paroxysmal atrial fibrillation diagnosed at the same time, coronary artery disease, hypertension. He is seen for follow up today.   Cardiac history: No prior history until 06/2018, when he developed URI and atrial flutter/atrial fibrillation. He had intermittent RVR while in the hospital, requiring multiple medications for management. He was initially on dronedarone  (stopped after 30 days), diltiazem , and metoprolol . Had volume overload with arrhythmia, improved with lasix . EF was normal. Afib has recurred, underwent successful ablation 04/2021.  Today: Here with his wife today. We reviewed the results of his CT together, reviewing actual images together today. Discussed stable vs. Unstable angina, management of this, options for cath and revascularization.   He feels that he can remain active, walking up to 2 miles at a good pace. Has shortness of breath initially on an incline, but did not have to stop. However, there have bene other times on flat ground at slow speeds where he did have limiting shortness of breath. Wife notes that he sounds like he is wheezing. Stops for about a minute or so, then able to continue.  No change in symptoms on imdur . Tolerating 30 mg pill of imdur , still getting headaches intermittently with this. Confirmed he is not taking metoprolol  per wife.  Had mild rectal bleeding on xarelto  and aspirin  therapy, dropped to xarelto  alone. CBC stable.   ROS: Denies chest pain, shortness of breath at rest, though notes some mild conversational dyspnea on occasion. No PND, orthopnea, LE edema or unexpected weight gain. No syncope or palpitations.  ROS otherwise negative except as noted.   Studies Reviewed: SABRA    EKG:  EKG Interpretation Date/Time:  Tuesday January 25 2024 11:43:23 EDT Ventricular Rate:  67 PR Interval:  168 QRS Duration:  132 QT Interval:  436 QTC Calculation: 460 R Axis:   -10  Text Interpretation: Sinus rhythm with occasional Premature ventricular complexes Right bundle branch block When compared with ECG of 31-Dec-2023 08:02, Premature ventricular complexes are now Present Confirmed by Bruckner Shelda [phone]) on 01/25/2024 11:54:15 AM    Physical Exam:   VS:  BP 110/70 (BP Location: Right Arm, Patient Position: Sitting, Cuff Size: Normal)   Pulse 77   Ht 5' 10 (1.778 m)   Wt 238 lb (108 kg)   SpO2 98%   BMI 34.15 kg/m    Wt Readings from Last 3 Encounters:  01/25/24 238 lb (108 kg)  12/31/23 242 lb (109.8 kg)  09/11/23 235 lb (106.6 kg)    GEN: Well nourished, well developed in no acute distress HEENT: Normal, moist mucous membranes NECK: No JVD CARDIAC: regular rhythm, normal S1 and S2, no rubs or gallops. No murmur. VASCULAR: Radial and DP pulses 2+ bilaterally. No carotid bruits RESPIRATORY:  Clear to auscultation without rales, wheezing or rhonchi  ABDOMEN: Soft, non-tender, non-distended MUSCULOSKELETAL:  Ambulates independently SKIN: Warm and dry, mild bilateral LE edema R>L, no warmth, erythema, pain NEUROLOGIC:  Alert and oriented x 3. No focal neuro deficits noted. PSYCHIATRIC:  Normal affect    ASSESSMENT AND PLAN: .    CAD, obstructive, with stable angina -see extensive phone note and review of his coronary CT -stable  symptoms; we have reviewed what constitutes unstable angina and needs immediate medical attention: -antianginals: on phone conversation, impression was that he was still on metoprolol , but wife confirms today that he has been off of this. Has not tolerated amlodipine in the past. Tried low dose imdur , able to tolerate 30 mg dose but still has headaches even after  several weeks on this.  -tried adding aspirin  to Xarelto , but he had hematochezia (stable CBC) so returned to Xarelto  only -given persistent symptoms of stable angina, location/severity of lesion, we will pursue cath. I discussed risks/benefits at length, see below. I have expressed my concern that this may not be an easily stented lesion, as it appears to start in left main and continue into proximal LAD. He also has additional LAD lesions distal to this proximal one (though since FFR already positive at proximal lesion, difficult to assess if these other lesions are hemodynamically significant). I am also concerned about his ability to tolerate DAPT and potentially triple therapy as he bled on aspirin  and Xarelto .  He is in excellent physical shape, and if CABG with LIMA to LAD is preferred, he would be a good surgical candidate. He could also at that time have left atrial clipping, which may give us  some protection against stroke if anticoagulation needs to be stopped in the future (has had remote hematuria and now some hematochezia).  I discussed that the cath will be the definitive evaluation for potential for PCI vs. CABG, and that images may need to be discussed at heart team meeting if there is equipoise. He understands. Will hold Xarelto  prior to procedure and have him taken aspirin  81 mg daily on the days his Xarelto  is held. He had labs on 7/8 (in Care Everywhere) and ECG today.  Informed Consent   Shared Decision Making/Informed Consent{  The risks [stroke (1 in 1000), death (1 in 1000), kidney failure [usually temporary] (1 in 500), bleeding (1 in 200), allergic reaction [possibly serious] (1 in 200)], benefits (diagnostic support and management of coronary artery disease) and alternatives of a cardiac catheterization were discussed in detail with [name] and he is willing to proceed.      Hyperlipidemia -on ezetimibe, has not tolerated statins -will refer to pharmD clinic to discuss  PCKS9i -LDL goal <70, last LDL 126 in care everywhere  History of paroxysmal Atrial flutter/paroxysmal atrial fibrillation -s/p ablation 04/2021 -EF 70-75%, moderate MR, concern for severe LA dilation -CHA2DS2/VAS Stroke Risk Points= 3, on anticoagulation for secondary hypercoagulable state     Hypertension -typically well controlled -continue losartan , amlodipine  CV risk counseling and prevention -recommend heart healthy/Mediterranean diet, with whole grains, fruits, vegetable, fish, lean meats, nuts, and olive oil. Limit salt. -recommend moderate walking, 3-5 times/week for 30-50 minutes each session. Aim for at least 150 minutes.week. Goal should be pace of 3 miles/hours, or walking 1.5 miles in 30 minutes -recommend avoidance of tobacco products. Avoid excess alcohol.  Dispo: based on results of cath, will either see back here in several weeks (if PCI) or longer if planned for CABG  Total time of encounter: I spent 57 minutes dedicated to the care of this patient on the date of this encounter to include pre-visit review of records, face-to-face time with the patient discussing conditions above, and clinical documentation with the electronic health record. We specifically spent time today discussing cardiac CT, discussing medications, discussing cath and possible outcomes.   Signed, Shelda Bruckner, MD   Shelda Bruckner, MD, PhD, [facility]  [facility]  [place]  Heart & Vascular at [facility] at [facility] [address], [address] [place], [place] [address] [phone]

## 2024-01-25 NOTE — Patient Instructions (Addendum)
 Medication Instructions:  TAKE ASPIRIN  81 MG DAILY ON DAYS YOU HAVE TO HOLD XARELTO  PRIOR TO CATH   Labwork: HAD 01/18/2024 (CARE EVERYWHERE)   Testing/Procedures: Your physician has requested that you have a cardiac catheterization. Cardiac catheterization is used to diagnose and/or treat various heart conditions. Doctors may recommend this procedure for a number of different reasons. The most common reason is to evaluate chest pain. Chest pain can be a symptom of coronary artery disease (CAD), and cardiac catheterization can show whether plaque is narrowing or blocking your heart's arteries. This procedure is also used to evaluate the valves, as well as measure the blood flow and oxygen levels in different parts of your heart. For further information please visit [email or web address] Please follow instruction sheet, as given.  Follow-Up: TO BE DETERMINED AFTER CATH   Any Other Special Instructions Will Be Listed Below (If Applicable).  CARDIAC CATH INSTRUCTIONS:   You are scheduled for a Cardiac Catheterization on Friday, July 18 with Dr. Lonni Cash.  1. Please arrive at [facility] En[facility] [address] [place], [place] [address] at 10:00 AM (This time is 2 hour(s) before your procedure to ensure your preparation).   Free valet parking service is available. You will check in at ADMITTING. The support person will be asked to wait in the waiting room.  It is OK to have someone drop you off and come back when you are ready to be discharged.    Special note: Every effort is made to have your procedure done on time. Please understand that emergencies sometimes delay scheduled procedures.  2. Diet: Do not eat solid foods after midnight.  The patient may have clear liquids until 5am upon the day of the procedure.  3. Labs: DONE 01/18/2024  4. Medication instructions in preparation for your procedure:   Contrast Allergy: No  HOLD XARELTO  2 DAYS  PRIOR TO PROCEDURE   HOLD FUROSEMIDE  AND SPIRONOLACTONE MORNING OF   On the morning of your procedure, take your Aspirin  81 mg and any morning medicines NOT listed above.  You may use sips of water.  5. Plan to go home the same day, you will only stay overnight if medically necessary. 6. Bring a current list of your medications and current insurance cards. 7. You MUST have a responsible person to drive you home. 8. Someone MUST be with you the first 24 hours after you arrive home or your discharge will be delayed. 9. Please wear clothes that are easy to get on and off and wear slip-on shoes.  Thank you for allowing us  to care for you!   -- [facility]

## 2024-01-26 ENCOUNTER — Telehealth: Payer: Self-pay | Admitting: *Deleted

## 2024-01-26 NOTE — Telephone Encounter (Signed)
 Reviewed procedure instructions with patient.

## 2024-01-26 NOTE — Telephone Encounter (Signed)
 Patient returned RN's call.

## 2024-01-26 NOTE — Telephone Encounter (Addendum)
 Cardiac Catheterization scheduled at [facility] for: Friday January 28, 2024 12 Noon Arrival time [facility] Main Entrance A at: 10 AM  Nothing to eat after midnight prior to procedure, clear liquids until 5 AM day of procedure.  Medication instructions: -Hold:  Xarelto -none 01/26/24 until post procedure  Spironolactone/Lasix -AM of procedure -Other usual morning medications can be taken with sips of water including aspirin  81 mg.  Plan to go home the same day, you will only stay overnight if medically necessary.  You must have responsible adult to drive you home.  Someone must be with you the first 24 hours after you arrive home.

## 2024-01-28 ENCOUNTER — Encounter (HOSPITAL_COMMUNITY)
Admission: RE | Disposition: A | Discharge status: Home / Self Care | Payer: Self-pay | Source: Home / Self Care | Attending: Cardiovascular Disease

## 2024-01-28 ENCOUNTER — Ambulatory Visit (HOSPITAL_COMMUNITY)
Admission: RE | Admit: 2024-01-28 | Discharge: 2024-01-28 | Disposition: A | Discharge status: Home / Self Care | Attending: Cardiovascular Disease | Admitting: Cardiovascular Disease

## 2024-01-28 ENCOUNTER — Other Ambulatory Visit: Payer: Self-pay

## 2024-01-28 ENCOUNTER — Ambulatory Visit (HOSPITAL_BASED_OUTPATIENT_CLINIC_OR_DEPARTMENT_OTHER): Admitting: Cardiology

## 2024-01-28 DIAGNOSIS — Z7901 Long term (current) use of anticoagulants: Secondary | ICD-10-CM | POA: Diagnosis not present

## 2024-01-28 DIAGNOSIS — I25110 Atherosclerotic heart disease of native coronary artery with unstable angina pectoris: Secondary | ICD-10-CM | POA: Diagnosis present

## 2024-01-28 DIAGNOSIS — E785 Hyperlipidemia, unspecified: Secondary | ICD-10-CM | POA: Insufficient documentation

## 2024-01-28 DIAGNOSIS — I2510 Atherosclerotic heart disease of native coronary artery without angina pectoris: Secondary | ICD-10-CM

## 2024-01-28 DIAGNOSIS — I10 Essential (primary) hypertension: Secondary | ICD-10-CM | POA: Diagnosis not present

## 2024-01-28 DIAGNOSIS — I4892 Unspecified atrial flutter: Secondary | ICD-10-CM

## 2024-01-28 DIAGNOSIS — Z79899 Other long term (current) drug therapy: Secondary | ICD-10-CM | POA: Diagnosis not present

## 2024-01-28 HISTORY — PX: LEFT HEART CATH AND CORONARY ANGIOGRAPHY: CATH118249

## 2024-01-28 SURGERY — LEFT HEART CATH AND CORONARY ANGIOGRAPHY
Anesthesia: LOCAL

## 2024-01-28 MED ORDER — ASPIRIN 81 MG PO CHEW: 81.0000 mg | CHEWABLE_TABLET | ORAL | Status: DC

## 2024-01-28 MED ORDER — HEPARIN SODIUM (PORCINE) 1000 UNIT/ML IJ SOLN
INTRAMUSCULAR | Status: AC
Start: 2024-01-28 — End: 2024-01-28
  Filled 2024-01-28: qty 10

## 2024-01-28 MED ORDER — HEPARIN (PORCINE) IN NACL 1000-0.9 UT/500ML-% IV SOLN
INTRAVENOUS | Status: DC | PRN
  Administered 2024-01-28 (×2): 500 mL via INTRA_ARTERIAL

## 2024-01-28 MED ORDER — MIDAZOLAM HCL 2 MG/2ML IJ SOLN
INTRAMUSCULAR | Status: AC
  Filled 2024-01-28: qty 2

## 2024-01-28 MED ORDER — ONDANSETRON HCL 4 MG/2ML IJ SOLN: 4.0000 mg | Freq: Four times a day (QID) | INTRAMUSCULAR | Status: DC | PRN

## 2024-01-28 MED ORDER — VERAPAMIL HCL 2.5 MG/ML IV SOLN
INTRAVENOUS | Status: AC
  Filled 2024-01-28: qty 2

## 2024-01-28 MED ORDER — HEPARIN SODIUM (PORCINE) 1000 UNIT/ML IJ SOLN
INTRAMUSCULAR | Status: DC | PRN
  Administered 2024-01-28: 5000 [IU] via INTRAVENOUS

## 2024-01-28 MED ORDER — SODIUM CHLORIDE 0.9 % WEIGHT BASED INFUSION: 1.0000 mL/kg/h | INTRAVENOUS | Status: DC

## 2024-01-28 MED ORDER — SODIUM CHLORIDE 0.9% FLUSH: 3.0000 mL | Freq: Two times a day (BID) | INTRAVENOUS | Status: DC

## 2024-01-28 MED ORDER — LIDOCAINE HCL (PF) 1 % IJ SOLN
INTRAMUSCULAR | Status: DC | PRN
  Administered 2024-01-28: 2 mL

## 2024-01-28 MED ORDER — ACETAMINOPHEN 325 MG PO TABS
650.0000 mg | ORAL_TABLET | ORAL | Status: DC | PRN
Start: 2024-01-28 — End: 2024-01-28

## 2024-01-28 MED ORDER — IOHEXOL 350 MG/ML SOLN
INTRAVENOUS | Status: DC | PRN
  Administered 2024-01-28: 35 mL via INTRA_ARTERIAL

## 2024-01-28 MED ORDER — SODIUM CHLORIDE 0.9 % IV SOLN: INTRAVENOUS | Status: AC

## 2024-01-28 MED ORDER — SODIUM CHLORIDE 0.9 % IV SOLN: 250.0000 mL | INTRAVENOUS | Status: DC | PRN

## 2024-01-28 MED ORDER — VERAPAMIL HCL 2.5 MG/ML IV SOLN
INTRAVENOUS | Status: DC | PRN
  Administered 2024-01-28: 10 mL via INTRA_ARTERIAL

## 2024-01-28 MED ORDER — MIDAZOLAM HCL 2 MG/2ML IJ SOLN
INTRAMUSCULAR | Status: DC | PRN
  Administered 2024-01-28: 1 mg via INTRAVENOUS

## 2024-01-28 MED ORDER — LABETALOL HCL 5 MG/ML IV SOLN: 10.0000 mg | INTRAVENOUS | Status: DC | PRN

## 2024-01-28 MED ORDER — FENTANYL CITRATE (PF) 100 MCG/2ML IJ SOLN
INTRAMUSCULAR | Status: AC
Start: 2024-01-28 — End: 2024-01-28
  Filled 2024-01-28: qty 2

## 2024-01-28 MED ORDER — HYDRALAZINE HCL 20 MG/ML IJ SOLN: 10.0000 mg | INTRAMUSCULAR | Status: DC | PRN

## 2024-01-28 MED ORDER — LIDOCAINE HCL (PF) 1 % IJ SOLN
INTRAMUSCULAR | Status: AC
  Filled 2024-01-28: qty 30

## 2024-01-28 MED ORDER — SODIUM CHLORIDE 0.9 % WEIGHT BASED INFUSION: 3.0000 mL/kg/h | INTRAVENOUS | Status: AC

## 2024-01-28 MED ORDER — SODIUM CHLORIDE 0.9% FLUSH: 3.0000 mL | INTRAVENOUS | Status: DC | PRN

## 2024-01-28 MED ORDER — FENTANYL CITRATE (PF) 100 MCG/2ML IJ SOLN
INTRAMUSCULAR | Status: DC | PRN
  Administered 2024-01-28: 25 ug via INTRAVENOUS

## 2024-01-28 SURGICAL SUPPLY — 7 items
CATH 5FR JL3.5 JR4 ANG PIG MP (CATHETERS) IMPLANT
CATH INFINITI 5 FR AL2 (CATHETERS) IMPLANT
DEVICE RAD COMP TR BAND LRG (VASCULAR PRODUCTS) IMPLANT
GLIDESHEATH SLEND SS 6F .021 (SHEATH) IMPLANT
GUIDEWIRE INQWIRE 1.5J.035X260 (WIRE) IMPLANT
PACK CARDIAC CATHETERIZATION (CUSTOM PROCEDURE TRAY) ×1 IMPLANT
SET ATX-X65L (MISCELLANEOUS) IMPLANT

## 2024-01-28 NOTE — Discharge Instructions (Signed)

## 2024-01-28 NOTE — Interval H&P Note (Signed)
 History and Physical Interval Note:  01/28/2024 10:54 AM  [name]  has presented today for surgery, with the diagnosis of abnormal ct.  The various methods of treatment have been discussed with the patient and family. After consideration of risks, benefits and other options for treatment, the patient has consented to  Procedure(s): LEFT HEART CATH AND CORONARY ANGIOGRAPHY (N/A) as a surgical intervention.  The patient's history has been reviewed, patient examined, no change in status, stable for surgery.  I have reviewed the patient's chart and labs.  Questions were answered to the patient's satisfaction.    Cath Lab Visit (complete for each Cath Lab visit)  Clinical Evaluation Leading to the Procedure:   ACS: No.  Non-ACS:    Anginal Classification: CCS II  Anti-ischemic medical therapy: Minimal Therapy (1 class of medications)  Non-Invasive Test Results: High-risk stress test findings: cardiac mortality >3%/year (Coronary CTA with possible severe left main/LAD stenosis)  Prior CABG: No previous CABG        Lonni Cash

## 2024-01-29 ENCOUNTER — Encounter (HOSPITAL_COMMUNITY): Payer: Self-pay | Admitting: Cardiovascular Disease

## 2024-01-29 ENCOUNTER — Encounter (HOSPITAL_BASED_OUTPATIENT_CLINIC_OR_DEPARTMENT_OTHER): Payer: Self-pay

## 2024-01-29 DIAGNOSIS — I25118 Atherosclerotic heart disease of native coronary artery with other forms of angina pectoris: Secondary | ICD-10-CM

## 2024-01-31 MED ORDER — ISOSORBIDE MONONITRATE ER 30 MG PO TB24: ORAL_TABLET | ORAL | Status: DC

## 2024-02-03 ENCOUNTER — Encounter (HOSPITAL_BASED_OUTPATIENT_CLINIC_OR_DEPARTMENT_OTHER): Payer: Self-pay

## 2024-02-03 DIAGNOSIS — E782 Mixed hyperlipidemia: Secondary | ICD-10-CM

## 2024-02-03 DIAGNOSIS — I25118 Atherosclerotic heart disease of native coronary artery with other forms of angina pectoris: Secondary | ICD-10-CM

## 2024-02-04 ENCOUNTER — Telehealth: Payer: Self-pay

## 2024-02-04 ENCOUNTER — Other Ambulatory Visit (HOSPITAL_COMMUNITY): Payer: Self-pay

## 2024-02-04 NOTE — Telephone Encounter (Signed)
 Repatha is non form on plan. They prefer Praulent. Can pt take Praulent? If so what strength?

## 2024-02-04 NOTE — Telephone Encounter (Signed)
 Would start with Praluent 75mg  q14 days.   Towanna Avery S Niklas Chretien, NP

## 2024-02-04 NOTE — Telephone Encounter (Signed)
 Pharmacy Patient Advocate Encounter   Received notification from Physician's Office that prior authorization for REPATHA  is required/requested.   Insurance verification completed.   The patient is insured through [facility] MEDICARE .   Per test claim:  PRALUENT is preferred by the insurance.  If suggested medication is appropriate, Please send in a new RX and discontinue this one. If not, please advise as to why it's not appropriate so that we may request a Prior Authorization. Please note, some preferred medications may still require a PA.  If the suggested medications have not been trialed and there are no contraindications to their use, the PA will not be submitted, as it will not be approved.

## 2024-02-05 ENCOUNTER — Encounter (HOSPITAL_BASED_OUTPATIENT_CLINIC_OR_DEPARTMENT_OTHER): Payer: Self-pay

## 2024-02-07 ENCOUNTER — Other Ambulatory Visit (HOSPITAL_COMMUNITY): Payer: Self-pay

## 2024-02-07 ENCOUNTER — Telehealth: Payer: Self-pay

## 2024-02-07 NOTE — Telephone Encounter (Signed)
 Pharmacy Patient Advocate Encounter   Received notification from Physician's Office that prior authorization for PRALUENT  is required/requested.   Insurance verification completed.   The patient is insured through [facility] .   Per test claim: PA required; PA submitted to above mentioned insurance via CoverMyMeds Key/confirmation #/EOC [id number] Status is pending

## 2024-02-07 NOTE — Telephone Encounter (Signed)
 Per provider, okay to change to Praluent  75 mg  PA request has been Submitted. New Encounter has been or will be created for follow up. For additional info see Pharmacy Prior Auth telephone encounter from 02/07/24.

## 2024-02-07 NOTE — Telephone Encounter (Signed)
 Praluent  75 mg/mL PA request has been submitted and status is pending.

## 2024-02-07 NOTE — Telephone Encounter (Signed)
 Patient updated via separate encounter. Appreciate PA Team assistance!  Kyra Laffey S Maalik Pinn, NP

## 2024-02-07 NOTE — Telephone Encounter (Signed)
 Pharmacy Patient Advocate Encounter  Received notification from [facility] that Prior Authorization for PRALUENT  has been APPROVED from 01/24/24 to 07/12/2098. Ran test claim, Copay is $116.57. This test claim was processed through [facility]- copay amounts may vary at other pharmacies due to pharmacy/plan contracts, or as the patient moves through the different stages of their insurance plan.

## 2024-02-08 ENCOUNTER — Other Ambulatory Visit (HOSPITAL_BASED_OUTPATIENT_CLINIC_OR_DEPARTMENT_OTHER): Payer: Self-pay

## 2024-02-08 MED ORDER — PRALUENT 75 MG/ML ~~LOC~~ SOAJ
75.0000 mg | SUBCUTANEOUS | 2 refills | Status: DC
  Filled 2024-02-08: qty 2, 28d supply, fill #0

## 2024-02-08 MED ORDER — PRALUENT 75 MG/ML ~~LOC~~ SOAJ: 75.0000 mg | SUBCUTANEOUS | 2 refills | Status: DC

## 2024-02-08 NOTE — Addendum Note (Signed)
 Addended by: Tevan Marian S on: 02/08/2024 08:01 AM   Modules accepted: Orders

## 2024-02-18 ENCOUNTER — Ambulatory Visit (HOSPITAL_BASED_OUTPATIENT_CLINIC_OR_DEPARTMENT_OTHER): Admitting: Cardiology

## 2024-02-18 ENCOUNTER — Encounter (HOSPITAL_BASED_OUTPATIENT_CLINIC_OR_DEPARTMENT_OTHER): Payer: Self-pay | Admitting: Cardiology

## 2024-02-18 VITALS — BP 128/72 | HR 80 | Ht 70.0 in | Wt 237.2 lb

## 2024-02-18 DIAGNOSIS — I10 Essential (primary) hypertension: Secondary | ICD-10-CM

## 2024-02-18 DIAGNOSIS — I25118 Atherosclerotic heart disease of native coronary artery with other forms of angina pectoris: Secondary | ICD-10-CM | POA: Diagnosis not present

## 2024-02-18 DIAGNOSIS — D6869 Other thrombophilia: Secondary | ICD-10-CM

## 2024-02-18 DIAGNOSIS — Z789 Other specified health status: Secondary | ICD-10-CM

## 2024-02-18 DIAGNOSIS — I480 Paroxysmal atrial fibrillation: Secondary | ICD-10-CM | POA: Diagnosis not present

## 2024-02-18 DIAGNOSIS — E782 Mixed hyperlipidemia: Secondary | ICD-10-CM

## 2024-02-18 DIAGNOSIS — Z7901 Long term (current) use of anticoagulants: Secondary | ICD-10-CM

## 2024-02-18 DIAGNOSIS — E7800 Pure hypercholesterolemia, unspecified: Secondary | ICD-10-CM

## 2024-02-18 NOTE — Patient Instructions (Signed)
 Medication Instructions:  Your physician recommends that you continue on your current medications as directed. Please refer to the Current Medication list given to you today.   Labwork: NONE  Testing/Procedures: NONE  Follow-Up: 04/04/2024 11:40 AM WITH DR LONNI

## 2024-02-18 NOTE — Progress Notes (Signed)
 Cardiology Office Note:  .   Date:  02/18/2024  ID:  Charles Cameron, DOB 02-16-1946, MRN [id number] PCP: Nikki Rams, Aliene, MD  [facility] Providers Cardiologist:  Shelda Bruckner, MD {  History of Present Illness: Charles Cameron is a 78 y.o. male with a hx of pneumonia 06/2018-07/2018 with atrial flutter with paroxysmal atrial fibrillation diagnosed at the same time, coronary artery disease, hypertension. He is seen for follow up today.   Cardiac history: No prior history until 06/2018, when he developed URI and atrial flutter/atrial fibrillation. He had intermittent RVR while in the hospital, requiring multiple medications for management. He was initially on dronedarone  (stopped after 30 days), diltiazem , and metoprolol . Had volume overload with arrhythmia, improved with lasix . EF was normal. Afib has recurred, underwent successful ablation 04/2021.  Today: Here to review cath/next steps. Reviewed recommendations (see my mychart message to him from 02/03/24). Tolerated first dose of repatha without issues. Being worked up for drooping eyelid.   On xarelto  only, had bleeding with aspirin . No longer on isosorbide  due to lightheadedness/dizziness.  Hasn't pushed himself cardio wise since all of this. Has gone on some walks, had some mild wheezing that resolved but no chest tightness or prolonged shortness of breath.   Thinks he is in afib today and possibly yesterday based on readings from his home BP cuff. Hasn't had symptoms.   ROS: Denies chest pain, shortness of breath at rest or with normal exertion. No PND, orthopnea, LE edema or unexpected weight gain. No syncope or palpitations. ROS otherwise negative except as noted.   Studies Reviewed: SABRA    EKG:  EKG Interpretation Date/Time:  Friday February 18 2024 15:34:19 EDT Ventricular Rate:  78 PR Interval:  164 QRS Duration:  140 QT Interval:  440 QTC Calculation: 501 R Axis:   -17  Text Interpretation: Normal  sinus rhythm Right bundle branch block Confirmed by Bruckner Shelda [phone]) on 02/18/2024 6:13:05 PM    Physical Exam:   VS:  BP 128/72   Pulse 80   Ht 5' 10 (1.778 m)   Wt 237 lb 3.2 oz (107.6 kg)   SpO2 96%   BMI 34.03 kg/m    Wt Readings from Last 3 Encounters:  02/18/24 237 lb 3.2 oz (107.6 kg)  01/28/24 235 lb (106.6 kg)  01/25/24 238 lb (108 kg)    GEN: Well nourished, well developed in no acute distress HEENT: Normal, moist mucous membranes NECK: No JVD CARDIAC: regular rhythm with premature beat, normal S1 and S2, no rubs or gallops. No murmur. VASCULAR: Radial and DP pulses 2+ bilaterally. No carotid bruits RESPIRATORY:  Clear to auscultation without rales, wheezing or rhonchi  ABDOMEN: Soft, non-tender, non-distended MUSCULOSKELETAL:  Ambulates independently SKIN: Warm and dry, mild bilateral LE edema R>L, no warmth, erythema, pain NEUROLOGIC:  Alert and oriented x 3. No focal neuro deficits noted. PSYCHIATRIC:  Normal affect    ASSESSMENT AND PLAN: .    CAD, obstructive, with stable angina -see extensive phone note and review of his coronary CT, visit prior to cath, and mychart message post cath -stable symptoms; we have reviewed what constitutes unstable angina and needs immediate medical attention: -antianginals: Has not tolerated amlodipine in the past. Tried low dose imdur , stopped due to lightheadedness.  -he will gradually increase activity, determine at what level he has angina. Discussed re-trial of low dose metoprolol  based on degree of angina -if he cannot tolerate antianginals and has limiting angina, CABG best option -  tried adding aspirin  to Xarelto , but he had hematochezia (stable CBC) so returned to Xarelto  only. Given both the complexity of stenting and the need for long term antiplatelet, CABG would be preferred over PCI for worsening symptoms -understands red flag signs that need immediate medical attention  Hyperlipidemia -on ezetimibe, has  not tolerated statins -has taken first dose of PCKS9i without issues -LDL goal <70, last LDL 126 in care everywhere prior to PCKS9i  History of paroxysmal Atrial flutter/paroxysmal atrial fibrillation -s/p ablation 04/2021 -EF 70-75%, moderate MR, concern for severe LA dilation -CHA2DS2/VAS Stroke Risk Points= 3, on anticoagulation for secondary hypercoagulable state   -machine at home reading afib, in sinus, has occasional ectopy on exam   Hypertension -well controlled -continue losartan , spironolactone  CV risk counseling and prevention -recommend heart healthy/Mediterranean diet, with whole grains, fruits, vegetable, fish, lean meats, nuts, and olive oil. Limit salt. -recommend moderate walking, 3-5 times/week for 30-50 minutes each session. Aim for at least 150 minutes.week. Goal should be pace of 3 miles/hours, or walking 1.5 miles in 30 minutes -recommend avoidance of tobacco products. Avoid excess alcohol.  Dispo: 6 weeks with me  Signed, Shelda Bruckner, MD   Shelda Bruckner, MD, PhD, [facility] [place]  [facility]  [place]  Heart & Vascular at [facility] at [facility] [address] [place], [place] [address] [phone]

## 2024-02-28 ENCOUNTER — Encounter (HOSPITAL_BASED_OUTPATIENT_CLINIC_OR_DEPARTMENT_OTHER): Payer: Self-pay

## 2024-03-25 ENCOUNTER — Encounter (HOSPITAL_BASED_OUTPATIENT_CLINIC_OR_DEPARTMENT_OTHER): Payer: Self-pay

## 2024-03-27 ENCOUNTER — Encounter (HOSPITAL_BASED_OUTPATIENT_CLINIC_OR_DEPARTMENT_OTHER): Payer: Self-pay | Admitting: Family

## 2024-03-30 ENCOUNTER — Other Ambulatory Visit: Payer: Self-pay | Admitting: Cardiology

## 2024-03-30 DIAGNOSIS — I480 Paroxysmal atrial fibrillation: Secondary | ICD-10-CM

## 2024-04-03 ENCOUNTER — Other Ambulatory Visit (HOSPITAL_BASED_OUTPATIENT_CLINIC_OR_DEPARTMENT_OTHER): Payer: Self-pay | Admitting: Family

## 2024-04-03 ENCOUNTER — Ambulatory Visit (HOSPITAL_BASED_OUTPATIENT_CLINIC_OR_DEPARTMENT_OTHER): Admitting: Family

## 2024-04-03 DIAGNOSIS — E782 Mixed hyperlipidemia: Secondary | ICD-10-CM

## 2024-04-03 DIAGNOSIS — I25118 Atherosclerotic heart disease of native coronary artery with other forms of angina pectoris: Secondary | ICD-10-CM

## 2024-04-04 ENCOUNTER — Encounter (HOSPITAL_BASED_OUTPATIENT_CLINIC_OR_DEPARTMENT_OTHER): Payer: Self-pay | Admitting: Cardiology

## 2024-04-04 ENCOUNTER — Ambulatory Visit (INDEPENDENT_AMBULATORY_CARE_PROVIDER_SITE_OTHER): Admitting: Cardiology

## 2024-04-04 VITALS — BP 124/64 | HR 76 | Ht 70.0 in | Wt 235.8 lb

## 2024-04-04 DIAGNOSIS — D6869 Other thrombophilia: Secondary | ICD-10-CM

## 2024-04-04 DIAGNOSIS — Z7901 Long term (current) use of anticoagulants: Secondary | ICD-10-CM

## 2024-04-04 DIAGNOSIS — I480 Paroxysmal atrial fibrillation: Secondary | ICD-10-CM

## 2024-04-04 DIAGNOSIS — I10 Essential (primary) hypertension: Secondary | ICD-10-CM

## 2024-04-04 DIAGNOSIS — E7800 Pure hypercholesterolemia, unspecified: Secondary | ICD-10-CM

## 2024-04-04 DIAGNOSIS — I25118 Atherosclerotic heart disease of native coronary artery with other forms of angina pectoris: Secondary | ICD-10-CM

## 2024-04-04 DIAGNOSIS — Z789 Other specified health status: Secondary | ICD-10-CM

## 2024-04-04 NOTE — Progress Notes (Signed)
 Cardiology Office Note:  .   Date:  04/04/2024  ID:  KRYSTIAN YOUNGLOVE, DOB 24-Dec-1945, MRN [id number] PCP: Nikki Rams, Aliene, MD  [facility] Providers Cardiologist:  Shelda Bruckner, MD {  History of Present Illness: Charles Cameron is a 78 y.o. male with a hx of pneumonia 06/2018-07/2018 with atrial flutter with paroxysmal atrial fibrillation diagnosed at the same time, coronary artery disease, hypertension. He is seen for follow up today.   Cardiac history: No prior history until 06/2018, when he developed URI and atrial flutter/atrial fibrillation. He had intermittent RVR while in the hospital, requiring multiple medications for management. He was initially on dronedarone  (stopped after 30 days), diltiazem , and metoprolol . Had volume overload with arrhythmia, improved with lasix . EF was normal. Afib has recurred, underwent successful ablation 04/2021.  Had coronary CT 01/10/24 for dyspnea on exertion. This showed severe lesion in distal LM to proximal LAD, with separate focal stenosis in mid and distal LAD, all suggesting 70-99% stenosis. TPV 1163 (extensive). Ca score 603. CT FFR positive in proximal LAD. Attempted medical management, had bleeding on aspirin , did not tolerate imdur . Underwent cath on 01/29/24. This showed 60-70% stenosis in proximal, mid, and distal LAD. No flow assessment done in lab. LAD was small caliber vessel, with larger RCA and circumflex. Recommended for medical management, vs requiring full metal jacket of stent for LAD or CABG.  Today: Now on praluent , tolerating.  Heart rate has been running up into the 90s, while he was at 8000 feet in [place] . Now he is running in the 70s. Has not felt like afib.   No chest pain. Does get a little winded on hikes at a fast pace. No symptoms with normal exertion. No swelling since he stopped amlodipine, has not required lasix .  ROS: Denies chest pain, shortness of breath at rest or with normal exertion. No PND,  orthopnea, LE edema or unexpected weight gain. No syncope or palpitations. ROS otherwise negative except as noted.   Studies Reviewed: SABRA    EKG:       Physical Exam:   VS:  BP 124/64   Pulse 76   Ht 5' 10 (1.778 m)   Wt 235 lb 12.8 oz (107 kg)   SpO2 98%   BMI 33.83 kg/m    Wt Readings from Last 3 Encounters:  04/04/24 235 lb 12.8 oz (107 kg)  02/18/24 237 lb 3.2 oz (107.6 kg)  01/28/24 235 lb (106.6 kg)    GEN: Well nourished, well developed in no acute distress HEENT: Normal, moist mucous membranes NECK: No JVD CARDIAC: regular rhythm with premature beat, normal S1 and S2, no rubs or gallops. No murmur. VASCULAR: Radial and DP pulses 2+ bilaterally. No carotid bruits RESPIRATORY:  Clear to auscultation without rales, wheezing or rhonchi  ABDOMEN: Soft, non-tender, non-distended MUSCULOSKELETAL:  Ambulates independently SKIN: Warm and dry, no edema NEUROLOGIC:  Alert and oriented x 3. No focal neuro deficits noted. PSYCHIATRIC:  Normal affect    ASSESSMENT AND PLAN: .    CAD, obstructive, with stable angina -see extensive phone note and review of his coronary CT, visit prior to cath, and mychart message post cath -stable symptoms; we have reviewed what constitutes unstable angina and needs immediate medical attention: -antianginals: Has not tolerated amlodipine in the past. Tried low dose imdur , stopped due to lightheadedness.  -he will gradually increase activity, determine at what level he has angina.  -Discussed re-trial of low dose metoprolol  if symptoms worsen -if  he cannot tolerate antianginals and has limiting angina, CABG best option -tried adding aspirin  to Xarelto , but he had hematochezia (stable CBC) so returned to Xarelto  only. Given both the complexity of stenting and the need for long term antiplatelet, CABG would be preferred over PCI for worsening symptoms -understands red flag signs that need immediate medical attention  Hypercholesterolemia Statin  intolerance -on ezetimibe, has not tolerated statins -now on PCSK9i, initially repatha, then praluent  due to insurance coverage -LDL goal <70, last LDL 126 in care everywhere prior to PCKS9i -recheck lipids ~ 2mos after PCKS9i doses, ordered today -consider lp(a), though now on PCSK9i  History of paroxysmal Atrial flutter/paroxysmal atrial fibrillation -s/p ablation 04/2021 -EF 70-75%, moderate MR, concern for severe LA dilation -CHA2DS2/VAS Stroke Risk Points= 3, on anticoagulation for secondary hypercoagulable state   -machine at home reading afib, in sinus, has occasional ectopy on exam   Hypertension -well controlled -continue losartan , spironolactone  CV risk counseling and prevention -recommend heart healthy/Mediterranean diet, with whole grains, fruits, vegetable, fish, lean meats, nuts, and olive oil. Limit salt. -recommend moderate walking, 3-5 times/week for 30-50 minutes each session. Aim for at least 150 minutes.week. Goal should be pace of 3 miles/hours, or walking 1.5 miles in 30 minutes -recommend avoidance of tobacco products. Avoid excess alcohol.  Dispo: 3 mos  Signed, Shelda Bruckner, MD   Shelda Bruckner, MD, PhD, [facility]  [facility]  [place]  Heart & Vascular at [facility] at [facility] [address], [address] [place], [place] [address] [phone]

## 2024-04-04 NOTE — Patient Instructions (Signed)
 Medication Instructions:  No changes *If you need a refill on your cardiac medications before your next appointment, please call your pharmacy*  Lab Work: Lipids due mid November - lab slip given today  If you have labs (blood work) drawn today and your tests are completely normal, you will receive your results only by: MyChart Message (if you have MyChart) OR A paper copy in the mail If you have any lab test that is abnormal or we need to change your treatment, we will call you to review the results.  Testing/Procedures: none  Follow-Up: At [facility], you and your health needs are our priority.  As part of our continuing mission to provide you with exceptional heart care, our providers are all part of one team.  This team includes your primary Cardiologist (physician) and Advanced Practice Providers or APPs (Physician Assistants and Nurse Practitioners) who all work together to provide you with the care you need, when you need it.  Your next appointment:   3 month(s)  Provider:  Rosaline Bane, NP, Reche Finder, NP or  Shelda Bruckner, MD,

## 2024-05-31 ENCOUNTER — Ambulatory Visit (HOSPITAL_BASED_OUTPATIENT_CLINIC_OR_DEPARTMENT_OTHER): Payer: Self-pay | Admitting: Cardiology

## 2024-05-31 LAB — LIPID PANEL
Chol/HDL Ratio: 2.1 ratio (ref 0.0–5.0)
Cholesterol, Total: 109 mg/dL (ref 100–199)
HDL: 52 mg/dL (ref >39)
LDL Chol Calc (NIH): 37 mg/dL (ref 0–99)
Triglycerides: 108 mg/dL (ref 0–149)
VLDL Cholesterol Cal: 20 mg/dL (ref 5–40)

## 2024-07-14 ENCOUNTER — Ambulatory Visit (HOSPITAL_BASED_OUTPATIENT_CLINIC_OR_DEPARTMENT_OTHER): Admitting: Cardiology

## 2024-07-21 ENCOUNTER — Encounter (HOSPITAL_BASED_OUTPATIENT_CLINIC_OR_DEPARTMENT_OTHER): Payer: Self-pay | Admitting: Family

## 2024-07-21 ENCOUNTER — Ambulatory Visit (INDEPENDENT_AMBULATORY_CARE_PROVIDER_SITE_OTHER): Admitting: Family

## 2024-07-21 VITALS — BP 128/70 | HR 92 | Ht 69.0 in | Wt 244.0 lb

## 2024-07-21 DIAGNOSIS — I480 Paroxysmal atrial fibrillation: Secondary | ICD-10-CM

## 2024-07-21 DIAGNOSIS — E785 Hyperlipidemia, unspecified: Secondary | ICD-10-CM

## 2024-07-21 DIAGNOSIS — D6859 Other primary thrombophilia: Secondary | ICD-10-CM

## 2024-07-21 DIAGNOSIS — I25118 Atherosclerotic heart disease of native coronary artery with other forms of angina pectoris: Secondary | ICD-10-CM | POA: Diagnosis not present

## 2024-07-21 NOTE — Progress Notes (Unsigned)
 " Cardiology Office Note   Date:  07/21/2024  ID:  YOUSSEF Cameron, DOB [birth year], MRN [id number] PCP: Nikki Rams, Aliene, MD  [facility] Providers Cardiologist:  Shelda Bruckner, MD     History of Present Illness Charles Cameron is a 79 y.o. male with hx of RBBB, pneumonia 06/2018-06/2019 with atrial flutter with paroxysmal atrial fib diagnosed at the same time, coronary artery calcification, hypertension.Initially on dronedarone  (stopped after 30 days), diltiazem , metoprolol . Underwent successful ablation 04/2021. Noted statin intolerance.   Echo 12/02/23 LVEF 60-65%, gr1dd, mild lVH, RV normal, trivial MR, mild dilation aortic root 40mm and ascending aorta 42mm.   Coronary CTA except 30/25 for DOE was severe lesion in distal LM to proximal LAD, separate focal stenosis in mid and distal LAD all with 70-99% stenosis.  TPD 1163 which was extensive.  Calcium score 603.  FFR positive in proximal LAD.  Attempted medical management, bleeding on aspirin , did not tolerate Imdur .  Subsequent LHC 01/29/2024 with 60 to 70% stenosis in proximal, mid, distal LAD.  LAD small caliber vessel with large RCA and circumflex.  Recommended for medical management versus full metal jacket versus CABG.  Elected for medical management.  He was started on Praluent  to tolerate.  Presents today for follow-up independently.  Doing overall well since last seen. Reports no shortness of breath nor dyspnea on exertion. Reports no chest pain, pressure, or tightness. No edema, orthopnea, PND. Reports no palpitations.  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 Working with PT on shoulder which he does feel is providing improvement.    ROS: Please see the history of present illness.    All other systems reviewed and are negative.   Studies Reviewed      Cardiac Studies & Procedures   ______________________________________________________________________________________________ CARDIAC  CATHETERIZATION  CARDIAC CATHETERIZATION 01/28/2024  Conclusion   Ost Cx to Prox Cx lesion is 20% stenosed.   Prox LAD lesion is 60% stenosed.   Mid LAD lesion is 60% stenosed.   Dist LAD lesion is 70% stenosed.  No obstructive disease in the left main The LAD is a moderate caliber vessel that becomes small in caliber in the distal segment and does not reach the apex. Moderate proximal and mid vessel disease. The Circumflex is large and has mild non-obstructive disease The RCA is a very large dominant vessel with mild non-obstructive disease LVEDP 17 mmHg  Recommendations: He has diffuse moderate disease in the his moderate caliber LAD. He has no angina and has normal LV function by echo. I think we should attempt medical management of his LAD disease for now. If he were to have  change in his symptoms, could consider PCI of the LAD which would involve stenting of the entire proximal and mid vessel.  Findings Coronary Findings Diagnostic  Dominance: Right  Left Anterior Descending Prox LAD lesion is 60% stenosed. The lesion is eccentric. Mid LAD lesion is 60% stenosed. Dist LAD lesion is 70% stenosed.  Left Circumflex Vessel is large. Ost Cx to Prox Cx lesion is 20% stenosed.  Right Coronary Artery Vessel is large.  Intervention  No interventions have been documented.     ECHOCARDIOGRAM  ECHOCARDIOGRAM COMPLETE 12/02/2023  Narrative ECHOCARDIOGRAM REPORT    Patient Name:   Charles Cameron Date of Exam: 12/02/2023 Medical Rec #:  [id number]        Height:       70.0 in Accession #:    [id number]       Weight:  235.0 lb Date of Birth:  04-10-1946        BSA:          2.235 m Patient Age:    66 years         BP:           125/70 mmHg Patient Gender: M                HR:           71 bpm. Exam Location:  Outpatient  Procedure: 3D Echo, 2D Echo, Cardiac Doppler, Color Doppler and Strain Analysis (Both Spectral and Color Flow Doppler were utilized  during procedure).  Indications:    Dyspnea  History:        Patient has no prior history of Echocardiogram examinations. Arrythmias:Atrial Fibrillation and Atrial Flutter; Risk Factors:Former Smoker and Hypertension.  Sonographer:    Orvil Holmes RDCS Referring Phys: [id number] [name]  IMPRESSIONS   1. Left ventricular ejection fraction, by estimation, is 60 to 65%. Left ventricular ejection fraction by 3D volume is 60 %. The left ventricle has normal function. The left ventricle has no regional wall motion abnormalities. There is mild left ventricular hypertrophy. Left ventricular diastolic parameters are consistent with Grade I diastolic dysfunction (impaired relaxation). The average left ventricular global longitudinal strain is -18.1 %. The global longitudinal strain is abnormal. 2. Right ventricular systolic function is normal. The right ventricular size is normal. There is normal pulmonary artery systolic pressure. The estimated right ventricular systolic pressure is 11.1 mmHg. 3. Right atrial size was mildly dilated. 4. The mitral valve is grossly normal. Trivial mitral valve regurgitation. 5. The aortic valve is tricuspid. There is moderate calcification of the aortic valve. Aortic valve regurgitation is not visualized. Aortic valve sclerosis/calcification is present, without any evidence of aortic stenosis. 6. Aortic dilatation noted. There is mild dilatation of the aortic root, measuring 40 mm. There is mild dilatation of the ascending aorta, measuring 42 mm. 7. The inferior vena cava is normal in size with greater than 50% respiratory variability, suggesting right atrial pressure of 3 mmHg.  Comparison(s): No prior Echocardiogram.  FINDINGS Left Ventricle: Left ventricular ejection fraction, by estimation, is 60 to 65%. Left ventricular ejection fraction by 3D volume is 60 %. The left ventricle has normal function. The left ventricle has no regional wall motion  abnormalities. The average left ventricular global longitudinal strain is -18.1 %. Strain was performed and the global longitudinal strain is abnormal. The left ventricular internal cavity size was normal in size. There is mild left ventricular hypertrophy. Left ventricular diastolic parameters are consistent with Grade I diastolic dysfunction (impaired relaxation). Indeterminate filling pressures.  Right Ventricle: The right ventricular size is normal. No increase in right ventricular wall thickness. Right ventricular systolic function is normal. There is normal pulmonary artery systolic pressure. The tricuspid regurgitant velocity is 1.42 m/s, and with an assumed right atrial pressure of 3 mmHg, the estimated right ventricular systolic pressure is 11.1 mmHg.  Left Atrium: Left atrial size was normal in size.  Right Atrium: Right atrial size was mildly dilated.  Pericardium: There is no evidence of pericardial effusion.  Mitral Valve: The mitral valve is grossly normal. Trivial mitral valve regurgitation.  Tricuspid Valve: The tricuspid valve is grossly normal. Tricuspid valve regurgitation is trivial.  Aortic Valve: The aortic valve is tricuspid. There is moderate calcification of the aortic valve. Aortic valve regurgitation is not visualized. Aortic valve sclerosis/calcification is present, without any evidence of aortic stenosis.  Pulmonic Valve: The pulmonic valve was grossly normal. Pulmonic valve regurgitation is trivial.  Aorta: Aortic dilatation noted. There is mild dilatation of the aortic root, measuring 40 mm. There is mild dilatation of the ascending aorta, measuring 42 mm.  Venous: The inferior vena cava is normal in size with greater than 50% respiratory variability, suggesting right atrial pressure of 3 mmHg.  IAS/Shunts: No atrial level shunt detected by color flow Doppler.  Additional Comments: 3D was performed not requiring image post processing on an independent  workstation and was normal.   LEFT VENTRICLE PLAX 2D LVIDd:         5.35 cm         Diastology LVIDs:         2.98 cm         LV e' medial:    6.09 cm/s LV PW:         1.03 cm         LV E/e' medial:  11.7 LV IVS:        0.98 cm         LV e' lateral:   7.40 cm/s LVOT diam:     2.10 cm         LV E/e' lateral: 9.6 LV SV:         89 LV SV Index:   40              2D Longitudinal LVOT Area:     3.46 cm        Strain 2D Strain GLS   -21.7 % (A4C): 2D Strain GLS   -17.4 % (A3C): 2D Strain GLS   -16.1 % (A2C): 2D Strain GLS   -18.1 % Avg:  3D Volume EF LV 3D EF:    Left ventricul ar ejection fraction by 3D volume is 60 %.  3D Volume EF: 3D EF:        60 % LV EDV:       123 ml LV ESV:       49 ml LV SV:        74 ml  RIGHT VENTRICLE RV Basal diam:  3.70 cm RV Mid diam:    3.13 cm RV S prime:     15.20 cm/s TAPSE (M-mode): 3.6 cm  LEFT ATRIUM             Index        RIGHT ATRIUM           Index LA diam:        4.40 cm 1.97 cm/m   RA Area:     21.00 cm LA Vol (A2C):   63.8 ml 28.54 ml/m  RA Volume:   64.40 ml  28.81 ml/m LA Vol (A4C):   45.4 ml 20.31 ml/m LA Biplane Vol: 56.5 ml 25.28 ml/m AORTIC VALVE LVOT Vmax:   118.00 cm/s LVOT Vmean:  78.100 cm/s LVOT VTI:    0.258 m  AORTA Ao Root diam: 4.00 cm Ao Asc diam:  4.20 cm  MITRAL VALVE               TRICUSPID VALVE MV Area (PHT): 3.27 cm    TR Peak grad:   8.1 mmHg MV Decel Time: 232 msec    TR Vmax:        142.00 cm/s MV E velocity: 71.30 cm/s MV A velocity: 56.10 cm/s  SHUNTS MV E/A ratio:  1.27        Systemic VTI:  0.26 m Systemic Diam: 2.10 cm  Vinie Maxcy MD Electronically signed by Vinie Maxcy MD Signature Date/Time: 12/02/2023/2:50:35 PM    Final      CT SCANS  CT CORONARY MORPH W/CTA COR W/SCORE 01/10/2024  Addendum 01/10/2024  6:23 PM ADDENDUM REPORT: 01/10/2024 18:20  EXAM: OVER-READ INTERPRETATION  CT CHEST  The following report is an over-read performed by  radiologist Dr. Fonda Mom [facility] on 01/10/2024. This over-read does not include interpretation of cardiac or coronary anatomy or pathology. The coronary CTA interpretation by the cardiologist is attached.  COMPARISON:  04/23/2021.  FINDINGS: Cardiovascular:  See findings discussed in the body of the report.  Mediastinum/Nodes: No suspicious adenopathy identified. Imaged mediastinal structures are unremarkable.  Lungs/Pleura: Imaged lungs are clear. No pleural effusion or pneumothorax.  Upper Abdomen: No acute abnormality.  Musculoskeletal: Left more prominent than right bilateral gynecomastia there. No acute osseous findings. There are thoracic degenerative changes.  IMPRESSION: No acute extracardiac incidental findings.   Electronically Signed By: Fonda Field M.D. On: 01/10/2024 18:20  Narrative CLINICAL DATA:  Dyspnea on exertion (DOE)  EXAM: Cardiac/Coronary CTA  TECHNIQUE: A non-contrast, gated CT scan was obtained with axial slices of 2.5 mm through the heart for calcium scoring. Calcium scoring was performed using the Agatston method. A 120 kV prospective, gated, contrast cardiac CT scan was obtained. Gantry rotation speed was 230 msec and collimation was 0.63 mm. Two sublingual nitroglycerin  tablets (0.8 mg) were given. The 3D data set was reconstructed with motion correction for the best systolic or diastolic phase. Images were analyzed on a dedicated workstation using MPR, MIP, and VRT modes. The patient received 100mL OMNIPAQUE  IOHEXOL  350 MG/ML SOLN cc of contrast.  FINDINGS: Image quality: Excellent.  Noise artifact is: Limited.  Calcium score: Calcium score is 603, which is 61st percentile.  Coronary Arteries:  Normal coronary origin.  Right dominance.  Left main: Normal caliber vessel. Diffused mixed calcified and noncalcified plaque. Distal left main and proximal LAD with continuous lesion, appears to be 25-49%  stenosis in distal left main.  Left anterior descending artery: Normal caliber vessel. Diffuse mixed calcified and noncalcified plaque in proximal LAD; there is a continuous stenosis beginning in the distal left main, and the portion in the proximal LAD has a 70-99% stenosis. Mid LAD with focal mixed calcified and noncalcified plaque with 70-99% stenosis. Distal LAD with focal mixed calcified and noncalcified plaque with 70-99% stenosis. The LAD gives off one small first diagonal branch, caliber too small to assess for stenosis.  Left circumflex artery: Normal caliber, non-dominant. Ostial vessel with mixed calcified and noncalcified plaque with 50-69% stenosis. There is otherwise scattered predominantly noncalcified plaque in the vessel without significant stenosis (maximum 1-24%). The LCX gives off small first, very large/branching second, small third obtuse marginal branches without significant stenosis.  Right coronary artery: Normal caliber, gives rise to the PDA. Scattered predominantly noncalcified plaque with maximum 1-24% stenosis.  Right Atrium: Right atrial size is visually normal.  Right Ventricle: The right ventricular cavity is visually normal.  Left Atrium: Left atrial size is visually normal with no left atrial appendage filling defect.  Left Ventricle: The ventricular cavity size is visually normal.  Pulmonary arteries: Normal in size.  Pulmonary veins: Normal pulmonary venous drainage.  Pericardium: Normal thickness without significant effusion or calcium present.  Cardiac valves: The aortic valve is trileaflet with mild calcification. The mitral valve is normal without significant calcification.  Aorta: Mildly enlarged caliber of ascending aorta (41 mm)  with aortic atherosclerosis.  Extra-cardiac findings: See attached radiology report for non-cardiac structures.  IMPRESSION: 1. Coronary disease with severe obstruction, CADRADS=4.  Continuous lesion from distal left main to proximal LAD with 70-99% stenosis in proximal LAD portion. Separate focal stenoses in mid and distal LAD with 70-99% stenosis. CT-FFR will be performed and reported separately.  2. Coronary calcium score of 603. This was 61st percentile for age-, sex, and race-matched controls.  3. Total plaque volume 1136 mm3 which is 70th percentile for age- and sex-matched controls (calcified plaque 136 mm3; non-calcified plaque 1000 mm3). TPV is extensive.  4. Normal coronary origin with right dominance.  5. Mildly enlarged caliber of ascending aorta (41 mm) with aortic atherosclerosis.  Findings communicated with ordering provider.  RECOMMENDATIONS: CAD-RADS 4: Severe stenosis. (70-99% or > 50% left main). Cardiac catheterization or CT FFR is recommended. Consider symptom-guided anti-ischemic pharmacotherapy as well as risk factor modification per guideline directed care. Invasive coronary angiography recommended with revascularization per published guideline statements.  Shelda Bruckner, MD  Electronically Signed: By: Shelda Bruckner M.D. On: 01/10/2024 15:45     ______________________________________________________________________________________________         Risk Assessment/Calculations  CHA2DS2-VASc Score = 4   This indicates a 4.8% annual risk of stroke. The patient's score is based upon: CHF History: 0 HTN History: 1 Diabetes History: 0 Stroke History: 0 Vascular Disease History: 1 Age Score: 2 Gender Score: 0            Physical Exam VS:  BP 128/70 (BP Location: Right Arm, Patient Position: Sitting, Cuff Size: Large)   Pulse 92   Ht 5' 9 (1.753 m)   Wt 244 lb (110.7 kg)   SpO2 94%   BMI 36.03 kg/m        Wt Readings from Last 3 Encounters:  07/21/24 244 lb (110.7 kg)  04/04/24 235 lb 12.8 oz (107 kg)  02/18/24 237 lb 3.2 oz (107.6 kg)    GEN: Well nourished, well developed in no acute  distress NECK: No JVD; No carotid bruits CARDIAC: RRR, no murmurs, rubs, gallops RESPIRATORY:  Clear to auscultation without rales, wheezing or rhonchi  ABDOMEN: Soft, non-tender, non-distended EXTREMITIES:  No edema; No deformity   ASSESSMENT AND PLAN  Assessment & Plan Hypertension BP at goal.  Continue eplerenone 25 mg daily, losartan  100 mg daily.  LE edema Managed on as needed Lasix  20 mg.  Has not needed in some time.  CAD /  Stable with no anginal symptoms. No indication for ischemic evaluation.  Previously did not tolerate antianginals including metoprolol , Imdur , aspirin . -Recommend aiming for 150 minutes of moderate intensity activity per week and following a heart healthy diet.   - GDMT includes Praluent  75 mg every 14 days, Zetia 10 mg daily.  Previous statin intolerance.  DM2 - Continue to follow with PCP.   Atrial flutter / Hypercoagulable state Maintaining NSR.  PVC noted by a rhythm strip today which is asymptomatic.  Continue Xarelto  20mg  daily. CHA2DS2-VASc Score = 4 [CHF History: 0, HTN History: 1, Diabetes History: 0, Stroke History: 0, Vascular Disease History: 1, Age Score: 2, Gender Score: 0].  Therefore, the patient's annual risk of stroke is 4.8 %.              Dispo: follow up in 6 mos  Signed, Reche GORMAN Finder, NP   "

## 2024-07-21 NOTE — Patient Instructions (Signed)
 Medication Instructions:   Your physician recommends that you continue on your current medications as directed. Please refer to the Current Medication list given to you today.  *If you need a refill on your cardiac medications before your next appointment, please call your pharmacy*    Follow-Up: At [facility], you and your health needs are our priority.  As part of our continuing mission to provide you with exceptional heart care, our providers are all part of one team.  This team includes your primary Cardiologist (physician) and Advanced Practice Providers or APPs (Physician Assistants and Nurse Practitioners) who all work together to provide you with the care you need, when you need it.  Your next appointment:   6 month(s)  Provider:   Shelda Bruckner, MD, Rosaline Bane, NP, or Reche Finder, NP

## 2024-07-23 ENCOUNTER — Encounter (HOSPITAL_BASED_OUTPATIENT_CLINIC_OR_DEPARTMENT_OTHER): Payer: Self-pay | Admitting: Family
